# Patient Record
Sex: Female | Born: 1940
Health system: Southern US, Community
[De-identification: ages and names within clinical notes are randomized; demographics above are authoritative.]

## PROBLEM LIST (undated history)

## (undated) DIAGNOSIS — I8390 Asymptomatic varicose veins of unspecified lower extremity: Secondary | ICD-10-CM

## (undated) DIAGNOSIS — L309 Dermatitis, unspecified: Secondary | ICD-10-CM

## (undated) DIAGNOSIS — G43909 Migraine, unspecified, not intractable, without status migrainosus: Secondary | ICD-10-CM

## (undated) DIAGNOSIS — C44711 Basal cell carcinoma of skin of unspecified lower limb, including hip: Secondary | ICD-10-CM

## (undated) DIAGNOSIS — M5134 Other intervertebral disc degeneration, thoracic region: Secondary | ICD-10-CM

## (undated) DIAGNOSIS — R600 Localized edema: Secondary | ICD-10-CM

## (undated) DIAGNOSIS — Z8489 Family history of other specified conditions: Secondary | ICD-10-CM

## (undated) DIAGNOSIS — M5136 Other intervertebral disc degeneration, lumbar region: Secondary | ICD-10-CM

## (undated) DIAGNOSIS — M1612 Unilateral primary osteoarthritis, left hip: Secondary | ICD-10-CM

## (undated) DIAGNOSIS — M51369 Other intervertebral disc degeneration, lumbar region without mention of lumbar back pain or lower extremity pain: Secondary | ICD-10-CM

## (undated) DIAGNOSIS — R2681 Unsteadiness on feet: Secondary | ICD-10-CM

## (undated) DIAGNOSIS — I480 Paroxysmal atrial fibrillation: Secondary | ICD-10-CM

## (undated) DIAGNOSIS — J189 Pneumonia, unspecified organism: Secondary | ICD-10-CM

## (undated) DIAGNOSIS — M1990 Unspecified osteoarthritis, unspecified site: Secondary | ICD-10-CM

## (undated) HISTORY — DX: Paroxysmal atrial fibrillation: I48.0

## (undated) HISTORY — DX: Unsteadiness on feet: R26.81

## (undated) HISTORY — DX: Unilateral primary osteoarthritis, left hip: M16.12

## (undated) HISTORY — PX: VARICOSE VEIN SURGERY: SHX832

## (undated) HISTORY — PX: JOINT REPLACEMENT: SHX530

## (undated) HISTORY — PX: TUBAL LIGATION: SHX77

## (undated) HISTORY — PX: BASAL CELL CARCINOMA EXCISION: SHX1214

## (undated) HISTORY — PX: DILATION AND CURETTAGE OF UTERUS: SHX78

---

## 1998-08-31 ENCOUNTER — Other Ambulatory Visit: Admission: RE | Admit: 1998-08-31 | Discharge: 1998-08-31 | Payer: Self-pay | Admitting: Gynecology

## 1998-09-14 ENCOUNTER — Ambulatory Visit (HOSPITAL_COMMUNITY): Admission: RE | Admit: 1998-09-14 | Discharge: 1998-09-14 | Payer: Self-pay | Admitting: Sports Medicine

## 1998-09-14 ENCOUNTER — Encounter: Payer: Self-pay | Admitting: Sports Medicine

## 1999-03-09 ENCOUNTER — Encounter: Admission: RE | Admit: 1999-03-09 | Discharge: 1999-03-09 | Payer: Self-pay | Admitting: Gynecology

## 1999-09-01 ENCOUNTER — Encounter: Payer: Self-pay | Admitting: Gynecology

## 1999-09-01 ENCOUNTER — Encounter: Admission: RE | Admit: 1999-09-01 | Discharge: 1999-09-01 | Payer: Self-pay | Admitting: Gynecology

## 1999-09-15 ENCOUNTER — Other Ambulatory Visit: Admission: RE | Admit: 1999-09-15 | Discharge: 1999-09-15 | Payer: Self-pay | Admitting: Gynecology

## 2000-09-02 ENCOUNTER — Encounter: Payer: Self-pay | Admitting: Gynecology

## 2000-09-02 ENCOUNTER — Encounter: Admission: RE | Admit: 2000-09-02 | Discharge: 2000-09-02 | Payer: Self-pay | Admitting: Gynecology

## 2000-09-20 ENCOUNTER — Other Ambulatory Visit: Admission: RE | Admit: 2000-09-20 | Discharge: 2000-09-20 | Payer: Self-pay | Admitting: Gynecology

## 2001-09-05 ENCOUNTER — Encounter: Payer: Self-pay | Admitting: Gynecology

## 2001-09-05 ENCOUNTER — Encounter: Admission: RE | Admit: 2001-09-05 | Discharge: 2001-09-05 | Payer: Self-pay | Admitting: Gynecology

## 2001-09-17 ENCOUNTER — Other Ambulatory Visit: Admission: RE | Admit: 2001-09-17 | Discharge: 2001-09-17 | Payer: Self-pay | Admitting: Gynecology

## 2002-08-07 ENCOUNTER — Ambulatory Visit (HOSPITAL_COMMUNITY): Admission: RE | Admit: 2002-08-07 | Discharge: 2002-08-07 | Payer: Self-pay | Admitting: Gastroenterology

## 2002-09-25 ENCOUNTER — Encounter: Admission: RE | Admit: 2002-09-25 | Discharge: 2002-09-25 | Payer: Self-pay | Admitting: Gynecology

## 2002-09-25 ENCOUNTER — Encounter: Payer: Self-pay | Admitting: Gynecology

## 2002-10-02 ENCOUNTER — Other Ambulatory Visit: Admission: RE | Admit: 2002-10-02 | Discharge: 2002-10-02 | Payer: Self-pay | Admitting: Gynecology

## 2003-10-08 ENCOUNTER — Ambulatory Visit (HOSPITAL_COMMUNITY): Admission: RE | Admit: 2003-10-08 | Discharge: 2003-10-08 | Payer: Self-pay | Admitting: Gynecology

## 2003-10-19 ENCOUNTER — Encounter: Admission: RE | Admit: 2003-10-19 | Discharge: 2003-10-19 | Payer: Self-pay | Admitting: Gynecology

## 2003-10-28 ENCOUNTER — Other Ambulatory Visit: Admission: RE | Admit: 2003-10-28 | Discharge: 2003-10-28 | Payer: Self-pay | Admitting: Gynecology

## 2004-10-20 ENCOUNTER — Ambulatory Visit (HOSPITAL_COMMUNITY): Admission: RE | Admit: 2004-10-20 | Discharge: 2004-10-20 | Payer: Self-pay | Admitting: Gynecology

## 2004-11-10 ENCOUNTER — Other Ambulatory Visit: Admission: RE | Admit: 2004-11-10 | Discharge: 2004-11-10 | Payer: Self-pay | Admitting: Gynecology

## 2005-09-07 ENCOUNTER — Other Ambulatory Visit: Admission: RE | Admit: 2005-09-07 | Discharge: 2005-09-07 | Payer: Self-pay | Admitting: Gynecology

## 2005-09-17 ENCOUNTER — Ambulatory Visit (HOSPITAL_COMMUNITY): Admission: RE | Admit: 2005-09-17 | Discharge: 2005-09-17 | Payer: Self-pay | Admitting: Gynecology

## 2006-09-19 ENCOUNTER — Ambulatory Visit (HOSPITAL_COMMUNITY): Admission: RE | Admit: 2006-09-19 | Discharge: 2006-09-19 | Payer: Self-pay | Admitting: Gynecology

## 2006-09-24 ENCOUNTER — Other Ambulatory Visit: Admission: RE | Admit: 2006-09-24 | Discharge: 2006-09-24 | Payer: Self-pay | Admitting: Gynecology

## 2007-09-22 ENCOUNTER — Ambulatory Visit (HOSPITAL_COMMUNITY): Admission: RE | Admit: 2007-09-22 | Discharge: 2007-09-22 | Payer: Self-pay | Admitting: Gynecology

## 2008-01-12 DIAGNOSIS — C4491 Basal cell carcinoma of skin, unspecified: Secondary | ICD-10-CM

## 2008-01-12 HISTORY — DX: Basal cell carcinoma of skin, unspecified: C44.91

## 2008-09-23 ENCOUNTER — Ambulatory Visit (HOSPITAL_COMMUNITY): Admission: RE | Admit: 2008-09-23 | Discharge: 2008-09-23 | Payer: Self-pay | Admitting: Gynecology

## 2009-09-28 ENCOUNTER — Ambulatory Visit (HOSPITAL_COMMUNITY): Admission: RE | Admit: 2009-09-28 | Discharge: 2009-09-28 | Payer: Self-pay | Admitting: Gynecology

## 2010-09-05 ENCOUNTER — Other Ambulatory Visit (HOSPITAL_COMMUNITY): Payer: Self-pay | Admitting: Gynecology

## 2010-09-05 DIAGNOSIS — Z1231 Encounter for screening mammogram for malignant neoplasm of breast: Secondary | ICD-10-CM

## 2010-10-02 ENCOUNTER — Ambulatory Visit (HOSPITAL_COMMUNITY)
Admission: RE | Admit: 2010-10-02 | Discharge: 2010-10-02 | Disposition: A | Discharge status: Home / Self Care | Payer: Medicare Other | Source: Ambulatory Visit | Attending: Gynecology | Admitting: Gynecology

## 2010-10-02 DIAGNOSIS — Z1231 Encounter for screening mammogram for malignant neoplasm of breast: Secondary | ICD-10-CM | POA: Insufficient documentation

## 2011-01-30 DIAGNOSIS — I480 Paroxysmal atrial fibrillation: Secondary | ICD-10-CM

## 2011-01-30 HISTORY — DX: Paroxysmal atrial fibrillation: I48.0

## 2011-09-11 ENCOUNTER — Other Ambulatory Visit (HOSPITAL_COMMUNITY): Payer: Self-pay | Admitting: Internal Medicine

## 2011-09-11 DIAGNOSIS — Z1231 Encounter for screening mammogram for malignant neoplasm of breast: Secondary | ICD-10-CM

## 2011-10-09 ENCOUNTER — Ambulatory Visit (HOSPITAL_COMMUNITY): Payer: Medicare Other

## 2011-11-27 ENCOUNTER — Ambulatory Visit (HOSPITAL_COMMUNITY)
Admission: RE | Admit: 2011-11-27 | Discharge: 2011-11-27 | Disposition: A | Discharge status: Home / Self Care | Payer: Medicare Other | Source: Ambulatory Visit | Attending: Internal Medicine | Admitting: Internal Medicine

## 2011-11-27 DIAGNOSIS — Z1231 Encounter for screening mammogram for malignant neoplasm of breast: Secondary | ICD-10-CM

## 2012-09-22 ENCOUNTER — Other Ambulatory Visit: Payer: Self-pay | Admitting: Gastroenterology

## 2012-09-30 ENCOUNTER — Encounter (HOSPITAL_COMMUNITY): Payer: Self-pay | Admitting: *Deleted

## 2012-10-09 ENCOUNTER — Other Ambulatory Visit (HOSPITAL_COMMUNITY): Payer: Self-pay | Admitting: Obstetrics and Gynecology

## 2012-10-09 DIAGNOSIS — Z1231 Encounter for screening mammogram for malignant neoplasm of breast: Secondary | ICD-10-CM

## 2012-10-24 ENCOUNTER — Encounter (HOSPITAL_COMMUNITY): Payer: Self-pay | Admitting: Pharmacy Technician

## 2012-10-28 ENCOUNTER — Encounter (HOSPITAL_COMMUNITY): Payer: Self-pay | Admitting: Anesthesiology

## 2012-10-28 ENCOUNTER — Encounter (HOSPITAL_COMMUNITY)
Admission: RE | Disposition: A | Discharge status: Home / Self Care | Payer: Self-pay | Source: Ambulatory Visit | Attending: Gastroenterology

## 2012-10-28 ENCOUNTER — Encounter (HOSPITAL_COMMUNITY): Payer: Self-pay | Admitting: *Deleted

## 2012-10-28 ENCOUNTER — Ambulatory Visit (HOSPITAL_COMMUNITY)
Admission: RE | Admit: 2012-10-28 | Discharge: 2012-10-28 | Disposition: A | Discharge status: Home / Self Care | Payer: Medicare Other | Source: Ambulatory Visit | Attending: Gastroenterology | Admitting: Gastroenterology

## 2012-10-28 ENCOUNTER — Ambulatory Visit (HOSPITAL_COMMUNITY): Payer: Medicare Other | Admitting: Anesthesiology

## 2012-10-28 DIAGNOSIS — K5730 Diverticulosis of large intestine without perforation or abscess without bleeding: Secondary | ICD-10-CM | POA: Insufficient documentation

## 2012-10-28 DIAGNOSIS — E7800 Pure hypercholesterolemia, unspecified: Secondary | ICD-10-CM | POA: Insufficient documentation

## 2012-10-28 DIAGNOSIS — Z1211 Encounter for screening for malignant neoplasm of colon: Secondary | ICD-10-CM | POA: Insufficient documentation

## 2012-10-28 DIAGNOSIS — Z7901 Long term (current) use of anticoagulants: Secondary | ICD-10-CM | POA: Insufficient documentation

## 2012-10-28 DIAGNOSIS — D126 Benign neoplasm of colon, unspecified: Secondary | ICD-10-CM | POA: Insufficient documentation

## 2012-10-28 DIAGNOSIS — I4891 Unspecified atrial fibrillation: Secondary | ICD-10-CM | POA: Insufficient documentation

## 2012-10-28 HISTORY — DX: Unspecified osteoarthritis, unspecified site: M19.90

## 2012-10-28 HISTORY — DX: Localized edema: R60.0

## 2012-10-28 HISTORY — PX: COLONOSCOPY WITH PROPOFOL: SHX5780

## 2012-10-28 HISTORY — DX: Asymptomatic varicose veins of unspecified lower extremity: I83.90

## 2012-10-28 SURGERY — COLONOSCOPY WITH PROPOFOL
Anesthesia: Monitor Anesthesia Care

## 2012-10-28 MED ORDER — KETAMINE HCL 50 MG/ML IJ SOLN
INTRAMUSCULAR | Status: DC | PRN
  Administered 2012-10-28: 10 mg via INTRAMUSCULAR

## 2012-10-28 MED ORDER — PROPOFOL 10 MG/ML IV BOLUS
INTRAVENOUS | Status: DC | PRN
  Administered 2012-10-28 (×4): 20 mg via INTRAVENOUS

## 2012-10-28 MED ORDER — PROPOFOL INFUSION 10 MG/ML OPTIME
INTRAVENOUS | Status: DC | PRN
  Administered 2012-10-28: 75 ug/kg/min via INTRAVENOUS

## 2012-10-28 MED ORDER — LACTATED RINGERS IV SOLN
INTRAVENOUS | Status: DC | PRN
  Administered 2012-10-28: 10:00:00 via INTRAVENOUS

## 2012-10-28 SURGICAL SUPPLY — 22 items

## 2012-10-28 NOTE — H&P (Signed)
  Procedure: Screening colonoscopy  History: The patient is a 72 year old female born [birth year]. The patient has paroxysmal atrial fibrillation and chronically takes xarelto.   The patient underwent a normal screening colonoscopy in March 2004. She is scheduled to undergo a repeat screening colonoscopy today.  Chronic medications: Diltiazem. Xarelto. Premarin.  Past medical and surgical history: Paroxysmal atrial fibrillation. Rosacea. Fibrocystic breast disease. Degenerative disc disease of the lumbosacral spine. Colonic diverticulosis. Ocular migraine syndrome. Hypercholesterolemia. Basal cell skin cancers. Chronic urticaria. Left varicose vein stripping. Tubal ligation.  Allergies: Iodine. Adhesive bandages. Metoprolol.  Exam: The patient is alert and lying comfortably on the endoscopy stretcher. Abdomen is soft, flat and nontender  to palpation. Cardiac exam reveals a regular rhythm. Lungs are clear to auscultation  Plan: Proceed with repeat screening colonoscopy as scheduled.

## 2012-10-28 NOTE — Op Note (Signed)
Procedure: Screening colonoscopy. Patient underwent a normal screening colonoscopy in March 2004  Endoscopist: Danise Edge  Premedication: Fall administered by anesthesia  Procedure: The patient was placed in the left lateral decubitus position. Anal inspection and digital rectal exam were normal. The Pentax pediatric colonoscope was introduced into the rectum and advanced to the cecum. A normal-appearing ileocecal valve and appendiceal orifice were identified. Colonic preparation for the exam today was good.  Rectum. Normal. Retroflex view of the distal rectum normal.  Sigmoid colon and descending colon. Left colonic diverticulosis. From the proximal descending colon, a 3 mm sessile polyp was removed with the cold biopsy forceps. Splenic flexure. Normal.  Transverse colon. Normal.  Hepatic flexure. Normal.  Ascending colon. From the mid ascending colon, a 3 mm sessile polyp was removed with the cold biopsy forceps.  Cecum and ileocecal valve. Normal.  Assessment:  #1. Left colonic diverticulosis  #2. A small polyp was removed from the mid ascending colon and a small polyp was removed from the proximal descending colon. The polyp were submitted for pathological evaluation.  Recommendations: If colon polyp returns neoplastic pathologically, the patient should undergo a surveillance colonoscopy in 5 years.

## 2012-10-28 NOTE — Anesthesia Preprocedure Evaluation (Addendum)
Anesthesia Evaluation  Patient identified by MRN, date of birth, ID band Patient awake    Reviewed: Allergy & Precautions, H&P , NPO status , Patient's Chart, lab work & pertinent test results  Airway Mallampati: II TM Distance: >3 FB Neck ROM: Full    Dental  (+) Teeth Intact and Dental Advisory Given   Pulmonary former smoker,  breath sounds clear to auscultation  Pulmonary exam normal       Cardiovascular + dysrhythmias Atrial Fibrillation Rate:Normal     Neuro/Psych  Headaches, negative psych ROS   GI/Hepatic negative GI ROS, Neg liver ROS,   Endo/Other  negative endocrine ROS  Renal/GU negative Renal ROS  negative genitourinary   Musculoskeletal negative musculoskeletal ROS (+)   Abdominal   Peds  Hematology negative hematology ROS (+)   Anesthesia Other Findings   Reproductive/Obstetrics                          Anesthesia Physical Anesthesia Plan  ASA: II  Anesthesia Plan: MAC   Post-op Pain Management:    Induction: Intravenous  Airway Management Planned: Simple Face Mask  Additional Equipment:   Intra-op Plan:   Post-operative Plan:   Informed Consent: I have reviewed the patients History and Physical, chart, labs and discussed the procedure including the risks, benefits and alternatives for the proposed anesthesia with the patient or authorized representative who has indicated his/her understanding and acceptance.   Dental advisory given  Plan Discussed with: CRNA  Anesthesia Plan Comments:         Anesthesia Quick Evaluation

## 2012-10-28 NOTE — Transfer of Care (Signed)
Immediate Anesthesia Transfer of Care Note  Patient: Tonya Turner  Procedure(s) Performed: Procedure(s) (LRB): COLONOSCOPY WITH PROPOFOL (N/A)  Patient Location: PACU  Anesthesia Type: MAC  Level of Consciousness: sedated, patient cooperative and responds to stimulaton  Airway & Oxygen Therapy: Patient Spontanous Breathing and Patient connected to face mask oxgen  Post-op Assessment: Report given to PACU RN and Post -op Vital signs reviewed and stable  Post vital signs: Reviewed and stable  Complications: No apparent anesthesia complications

## 2012-10-28 NOTE — Anesthesia Postprocedure Evaluation (Signed)
Anesthesia Post Note  Patient: Tonya Turner  Procedure(s) Performed: Procedure(s) (LRB): COLONOSCOPY WITH PROPOFOL (N/A)  Anesthesia type: MAC  Patient location: PACU  Post pain: Pain level controlled  Post assessment: Post-op Vital signs reviewed  Last Vitals:  Filed Vitals:   10/28/12 1124  BP: 122/74  Pulse:   Temp:   Resp: 12    Post vital signs: Reviewed  Level of consciousness: sedated  Complications: No apparent anesthesia complications

## 2012-10-29 ENCOUNTER — Encounter (HOSPITAL_COMMUNITY): Payer: Self-pay | Admitting: Gastroenterology

## 2012-12-16 ENCOUNTER — Ambulatory Visit (HOSPITAL_COMMUNITY)
Admission: RE | Admit: 2012-12-16 | Discharge: 2012-12-16 | Disposition: A | Discharge status: Home / Self Care | Payer: Medicare Other | Source: Ambulatory Visit | Attending: Obstetrics and Gynecology | Admitting: Obstetrics and Gynecology

## 2012-12-16 DIAGNOSIS — Z1231 Encounter for screening mammogram for malignant neoplasm of breast: Secondary | ICD-10-CM

## 2013-01-15 ENCOUNTER — Other Ambulatory Visit: Payer: Self-pay | Admitting: Obstetrics and Gynecology

## 2013-01-15 ENCOUNTER — Other Ambulatory Visit (HOSPITAL_COMMUNITY)
Admission: RE | Admit: 2013-01-15 | Discharge: 2013-01-15 | Disposition: A | Discharge status: Home / Self Care | Payer: Medicare Other | Source: Ambulatory Visit | Attending: Obstetrics and Gynecology | Admitting: Obstetrics and Gynecology

## 2013-01-15 DIAGNOSIS — Z124 Encounter for screening for malignant neoplasm of cervix: Secondary | ICD-10-CM | POA: Insufficient documentation

## 2013-01-15 DIAGNOSIS — Z1151 Encounter for screening for human papillomavirus (HPV): Secondary | ICD-10-CM | POA: Insufficient documentation

## 2013-02-10 ENCOUNTER — Other Ambulatory Visit: Payer: Self-pay | Admitting: Cardiology

## 2013-02-10 DIAGNOSIS — I4891 Unspecified atrial fibrillation: Secondary | ICD-10-CM

## 2013-02-10 DIAGNOSIS — Z79899 Other long term (current) drug therapy: Secondary | ICD-10-CM

## 2013-05-20 ENCOUNTER — Encounter: Payer: Self-pay | Admitting: Podiatry

## 2013-05-20 ENCOUNTER — Ambulatory Visit (INDEPENDENT_AMBULATORY_CARE_PROVIDER_SITE_OTHER): Payer: Medicare Other | Admitting: Podiatry

## 2013-05-20 VITALS — BP 106/76 | HR 80 | Resp 16 | Ht 65.0 in | Wt 151.0 lb

## 2013-05-20 DIAGNOSIS — M2040 Other hammer toe(s) (acquired), unspecified foot: Secondary | ICD-10-CM

## 2013-05-20 DIAGNOSIS — L84 Corns and callosities: Secondary | ICD-10-CM

## 2013-05-20 DIAGNOSIS — M779 Enthesopathy, unspecified: Secondary | ICD-10-CM

## 2013-05-20 MED ORDER — TRIAMCINOLONE ACETONIDE 10 MG/ML IJ SUSP
10.0000 mg | Freq: Once | INTRAMUSCULAR | Status: AC
  Administered 2013-05-20: 10 mg

## 2013-05-20 NOTE — Progress Notes (Signed)
Subjective:     Patient ID: Tonya Turner, female   DOB: 1940-07-14, 72 y.o.   MRN: [id number]  HPI patient presents stating I'm having a lot of pain between the big toe and second toe on my right foot with inflammation and corn formation. Patient also points to the outside of the left fifth metatarsal stating that has corn and has been sore   Review of Systems     Objective:   Physical Exam Neurovascular status is intact and there's been no health history changes noted   patient is noted to have structural hammertoe deformity second toe right with keratotic lesion formation and pain with fluid buildup around the interphalangeal joint. Also noted to have a lesion underneath the fifth metatarsal right Assessment:     Hammertoe deformity second right #1 and capsulitis second interphalangeal joint right along with keratotic lesion formation    Plan:     Reviewed hammertoe deformity and consideration for surgery. Today I injected the interphalangeal joint to milligrams dexamethasone Kenalog 2 mg Xylocaine and debrided lesions. Reappoint when symptomatic

## 2013-06-03 ENCOUNTER — Telehealth: Payer: Self-pay | Admitting: Cardiology

## 2013-06-03 NOTE — Telephone Encounter (Signed)
New problem  Pt need a new prescription faxed for 90 day for Xaralto sent to [facility]. An order will be sent to [place]. Just an [place]

## 2013-06-09 ENCOUNTER — Telehealth: Payer: Self-pay

## 2013-06-09 MED ORDER — RIVAROXABAN 20 MG PO TABS: 20.0000 mg | ORAL_TABLET | Freq: Every day | ORAL | Status: DC

## 2013-06-09 NOTE — Telephone Encounter (Signed)
Rx refilled.

## 2013-07-08 ENCOUNTER — Ambulatory Visit (INDEPENDENT_AMBULATORY_CARE_PROVIDER_SITE_OTHER): Payer: Medicare HMO | Admitting: Podiatry

## 2013-07-08 ENCOUNTER — Encounter: Payer: Self-pay | Admitting: Podiatry

## 2013-07-08 VITALS — BP 103/69 | HR 74 | Resp 16

## 2013-07-08 DIAGNOSIS — M779 Enthesopathy, unspecified: Secondary | ICD-10-CM

## 2013-07-08 DIAGNOSIS — L84 Corns and callosities: Secondary | ICD-10-CM

## 2013-07-08 DIAGNOSIS — M2040 Other hammer toe(s) (acquired), unspecified foot: Secondary | ICD-10-CM

## 2013-07-08 MED ORDER — TRIAMCINOLONE ACETONIDE 10 MG/ML IJ SUSP
10.0000 mg | Freq: Once | INTRAMUSCULAR | Status: AC
  Administered 2013-07-08: 10 mg

## 2013-07-09 NOTE — Progress Notes (Signed)
Subjective:     Patient ID: Tonya Turner, female   DOB: February 17, 1941, 73 y.o.   MRN: [id number]  HPI patient presents stating this corn is back on my second toe right foot. It was good for about 5 weeks and now is sore again and makes my daily walks difficult   Review of Systems     Objective:   Physical Exam Neurovascular status intact with inflamed interphalangeal joint right second toe medial side with keratotic lesion formation. Also noted to have structural deformity with hallux pressing against the second toe    Assessment:     Bone structural deformity in combination with interphalangeal joint capsulitis second toe right with keratotic lesion    Plan:     We are going to do one more small interphalangeal joint injection which was accomplished today with 1.5 mg dexamethasone Kenalog combination 2 mg Xylocaine and deep debridement of lesion accomplished. I also dispensed splint to lower the second toe and discussed surgery if this does not get better

## 2013-08-06 ENCOUNTER — Other Ambulatory Visit: Payer: Medicare Other

## 2013-08-08 ENCOUNTER — Encounter: Payer: Self-pay | Admitting: Cardiology

## 2013-08-08 DIAGNOSIS — R600 Localized edema: Secondary | ICD-10-CM | POA: Insufficient documentation

## 2013-08-08 DIAGNOSIS — I499 Cardiac arrhythmia, unspecified: Secondary | ICD-10-CM | POA: Insufficient documentation

## 2013-08-08 DIAGNOSIS — M1990 Unspecified osteoarthritis, unspecified site: Secondary | ICD-10-CM | POA: Insufficient documentation

## 2013-08-08 DIAGNOSIS — C801 Malignant (primary) neoplasm, unspecified: Secondary | ICD-10-CM | POA: Insufficient documentation

## 2013-08-08 DIAGNOSIS — I8390 Asymptomatic varicose veins of unspecified lower extremity: Secondary | ICD-10-CM | POA: Insufficient documentation

## 2013-08-11 ENCOUNTER — Other Ambulatory Visit (INDEPENDENT_AMBULATORY_CARE_PROVIDER_SITE_OTHER): Payer: Commercial Managed Care - HMO

## 2013-08-11 DIAGNOSIS — I4891 Unspecified atrial fibrillation: Secondary | ICD-10-CM

## 2013-08-11 DIAGNOSIS — Z79899 Other long term (current) drug therapy: Secondary | ICD-10-CM

## 2013-08-11 LAB — CBC
HCT: 39.7 % (ref 36.0–46.0)
Hemoglobin: 13.3 g/dL (ref 12.0–15.0)
MCHC: 33.6 g/dL (ref 30.0–36.0)
MCV: 95.5 fl (ref 78.0–100.0)
Platelets: 339 10*3/uL (ref 150.0–400.0)
RBC: 4.16 Mil/uL (ref 3.87–5.11)
RDW: 13.8 % (ref 11.5–14.6)
WBC: 6.4 10*3/uL (ref 4.5–10.5)

## 2013-08-11 LAB — CREATININE, SERUM: CREATININE: 0.7 mg/dL (ref 0.4–1.2)

## 2013-08-14 ENCOUNTER — Ambulatory Visit: Payer: Medicare Other | Admitting: Cardiology

## 2013-08-19 ENCOUNTER — Ambulatory Visit (INDEPENDENT_AMBULATORY_CARE_PROVIDER_SITE_OTHER): Payer: Commercial Managed Care - HMO | Admitting: Cardiology

## 2013-08-19 ENCOUNTER — Encounter: Payer: Self-pay | Admitting: Cardiology

## 2013-08-19 VITALS — BP 103/70 | HR 75 | Ht 65.0 in | Wt 155.0 lb

## 2013-08-19 DIAGNOSIS — I480 Paroxysmal atrial fibrillation: Secondary | ICD-10-CM

## 2013-08-19 DIAGNOSIS — I4891 Unspecified atrial fibrillation: Secondary | ICD-10-CM

## 2013-08-19 DIAGNOSIS — Z79899 Other long term (current) drug therapy: Secondary | ICD-10-CM

## 2013-08-19 DIAGNOSIS — Z7901 Long term (current) use of anticoagulants: Secondary | ICD-10-CM

## 2013-08-19 NOTE — Patient Instructions (Signed)
Your physician recommends that you continue on your current medications as directed. Please refer to the Current Medication list given to you today.  Your physician wants you to follow-up in: 6 months with Dr. Skains. You will receive a reminder letter in the mail two months in advance. If you don't receive a letter, please call our office to schedule the follow-up appointment.  

## 2013-08-19 NOTE — Progress Notes (Signed)
[place]. [address], [address] [place], [place]  [address] Phone: [phone] Fax:  [phone]  Date:  08/19/2013   ID:  Tonya Turner, DOB 26-May-1941, MRN [id number]  PCP:  Irven Shelling, MD   History of Present Illness: Tonya Turner is a 73 y.o. female for paroxysmal atrial fibrillation followup. Very rare episode of AFIB. Once with a walk with a friend.  She is being protected with anticoagulation. She was very diligent about trying to find out the difference in price between oral anticoagulants.  Overall, she feels well. She's not had any stroke, bleeding, chest pain symptoms.  We went over her CHADS-VAS score and it is 2 -female, age greater than 36. Because of this, she deserves anticoagulation.   1. Normal LV size and function.2. There were no regional wall motion abnormalities.3. Left ventricular ejection fraction estimated by 2D at 60-65 percent. 4. Trace mitral valve regurgitation.5. Trivial tricuspid regurgitation    Wt Readings from Last 3 Encounters:  08/19/13 155 lb (70.308 kg)  05/20/13 151 lb (68.493 kg)  10/28/12 155 lb (70.308 kg)     Past Medical History  Diagnosis Date  . Dysrhythmia     hx. A.Fib.-tx. Xarelto  . Varicose vein     hx. of-   . Edema of extremities     usually lower extremities- bilateral  . Headache(784.0)     rare occular migraine- none recent  . Cancer     basal cell skin cancer lesions excised  . Arthritis     hands-mild    Past Surgical History  Procedure Laterality Date  . Varicose vein surgery      left  . Tubal ligation    . Colonoscopy with propofol N/A 10/28/2012    Procedure: COLONOSCOPY WITH PROPOFOL;  Surgeon: Garlan Fair, MD;  Location: [facility];  Service: Endoscopy;  Laterality: N/A;    Current Outpatient Prescriptions  Medication Sig Dispense Refill  . calcium-vitamin D (OSCAL WITH D) 500-200 MG-UNIT per tablet Take 1 tablet by mouth daily.      Marland Kitchen conjugated estrogens (PREMARIN) vaginal  cream Place 1 Applicatorful vaginally 2 (two) times a week.       . diltiazem (DILACOR XR) 180 MG 24 hr capsule Take 180 mg by mouth every morning.      . Diltiazem HCl Coated Beads (CARTIA XT PO) Take by mouth daily.      Marland Kitchen glucosamine-chondroitin 500-400 MG tablet Take 0.5 tablets by mouth 1 day or 1 dose.       . Multiple Vitamin (MULTIVITAMIN WITH MINERALS) TABS Take 0.5 tablets by mouth daily.      . Rivaroxaban (XARELTO) 20 MG TABS tablet Take 1 tablet (20 mg total) by mouth daily with supper.  90 tablet  2   No current facility-administered medications for this visit.    Allergies:    Allergies  Allergen Reactions  . Other Rash    Adhesives-skin irritation  . Iodine Solution [Povidone Iodine] Dermatitis    Topical -causes skin inflammation    Social History:  The patient  reports that she quit smoking about 29 years ago. Her smoking use included Cigarettes. She has a 7.5 pack-year smoking history. She has never used smokeless tobacco. She reports that she drinks alcohol. She reports that she does not use illicit drugs.   ROS:  Please see the history of present illness.   Denies any bleeding, syncope, orthopnea, PND    PHYSICAL EXAM: VS:  BP 103/70  Pulse 75  Ht 5\' 5"  (1.651 m)  Wt 155 lb (70.308 kg)  BMI 25.79 kg/m2 Well nourished, well developed, in no acute distress HEENT: normal Neck: no JVD Cardiac:  normal S1, S2; RRR; no murmur Lungs:  clear to auscultation bilaterally, no wheezing, rhonchi or rales Abd: soft, nontender, no hepatomegaly Ext: no edema Skin: warm and dry Neuro: no focal abnormalities noted  EKG:  None today   ASSESSMENT AND PLAN:  1. Atrial fibrillation - paroxysmal. Doing very well. No changes. Very brief episodes. Continue with current medication. 2. Chronic anticoagulation-no changes made to medications. Check lab work every 6 months. Creatinine 0.7, hemoglobin 13.3. Labs reviewed. 3. Medication management-went over an extensive list of  medications that can or cannot be used with her current medications. She appreciated this counseling.  Signed, Candee Furbish, MD [facility]  08/19/2013 2:16 PM

## 2013-09-01 ENCOUNTER — Other Ambulatory Visit: Payer: Self-pay | Admitting: *Deleted

## 2013-09-01 MED ORDER — DILTIAZEM HCL ER 180 MG PO CP24: 180.0000 mg | ORAL_CAPSULE | Freq: Every morning | ORAL | Status: DC

## 2013-11-26 ENCOUNTER — Other Ambulatory Visit (HOSPITAL_COMMUNITY): Payer: Self-pay | Admitting: Obstetrics and Gynecology

## 2013-11-26 DIAGNOSIS — Z1231 Encounter for screening mammogram for malignant neoplasm of breast: Secondary | ICD-10-CM

## 2013-12-17 ENCOUNTER — Ambulatory Visit (HOSPITAL_COMMUNITY)
Admission: RE | Admit: 2013-12-17 | Discharge: 2013-12-17 | Disposition: A | Discharge status: Home / Self Care | Payer: Medicare HMO | Source: Ambulatory Visit | Attending: Obstetrics and Gynecology | Admitting: Obstetrics and Gynecology

## 2013-12-17 DIAGNOSIS — Z1231 Encounter for screening mammogram for malignant neoplasm of breast: Secondary | ICD-10-CM | POA: Diagnosis not present

## 2013-12-29 ENCOUNTER — Telehealth: Payer: Self-pay | Admitting: Cardiology

## 2013-12-29 NOTE — Telephone Encounter (Signed)
New problem    Pt need new prescription for Xarelto she need 30 day instead 90 day because it would cost less. Please call pt.

## 2013-12-29 NOTE — Telephone Encounter (Signed)
NA - need to know which pharmacy she would like the RX to go to.

## 2013-12-30 MED ORDER — RIVAROXABAN 20 MG PO TABS: 20.0000 mg | ORAL_TABLET | Freq: Every day | ORAL | Status: DC

## 2013-12-30 MED ORDER — DILTIAZEM HCL ER 180 MG PO CP24: 180.0000 mg | ORAL_CAPSULE | Freq: Every morning | ORAL | Status: DC

## 2013-12-30 NOTE — Telephone Encounter (Signed)
Pt calling because she needs a 30 day supply send into her mail order pharmacy.  She can not afford a 90 day supply at this time to do being in the "doughnut hole"  She also requests a written RX for Dilt and Xarelto be mailed to her home that she can take to a local pharmacy to check the price.  She will be going to [place] for 4 weeks and wants to make sure she has enough with her and doesn't get stuck without it.  Rx printed and mailed to home address.

## 2014-01-05 ENCOUNTER — Other Ambulatory Visit: Payer: Self-pay

## 2014-01-05 MED ORDER — RIVAROXABAN 20 MG PO TABS: 20.0000 mg | ORAL_TABLET | Freq: Every day | ORAL | Status: DC

## 2014-01-05 NOTE — Telephone Encounter (Signed)
Patient called to get a rx for xarelto sent to [facility] and i gave her samples and a frre 30 day card placed at front and sent in rx

## 2014-01-11 ENCOUNTER — Other Ambulatory Visit: Payer: Self-pay

## 2014-01-11 MED ORDER — RIVAROXABAN 20 MG PO TABS: 20.0000 mg | ORAL_TABLET | Freq: Every day | ORAL | Status: DC

## 2014-01-11 MED ORDER — DILTIAZEM HCL ER 180 MG PO CP24: 180.0000 mg | ORAL_CAPSULE | Freq: Every morning | ORAL | Status: DC

## 2014-02-17 ENCOUNTER — Telehealth: Payer: Self-pay | Admitting: Cardiology

## 2014-02-17 DIAGNOSIS — Z79899 Other long term (current) drug therapy: Secondary | ICD-10-CM

## 2014-02-17 NOTE — Telephone Encounter (Signed)
Phone ringed several times no answer service.

## 2014-02-17 NOTE — Telephone Encounter (Signed)
Patient requests labs be drawn before her appointment with Dr. Marlou Porch on 02/24/2014. Scheduled these.

## 2014-02-17 NOTE — Telephone Encounter (Signed)
Spoke with patient who would like to have her labs drawn 02/18/2014. Her appointment with Dr. Marlou Porch is 02/24/2014.

## 2014-02-17 NOTE — Telephone Encounter (Signed)
New message    Patient calling stating from the last office visit it did not states for her to have lab work . However, she is asking for labs to be drawn.

## 2014-02-18 ENCOUNTER — Other Ambulatory Visit (INDEPENDENT_AMBULATORY_CARE_PROVIDER_SITE_OTHER): Payer: Commercial Managed Care - HMO

## 2014-02-18 DIAGNOSIS — Z79899 Other long term (current) drug therapy: Secondary | ICD-10-CM

## 2014-02-18 LAB — CBC WITH DIFFERENTIAL/PLATELET
Basophils Absolute: 0.1 10*3/uL (ref 0.0–0.1)
Basophils Relative: 1 % (ref 0.0–3.0)
EOS PCT: 1.5 % (ref 0.0–5.0)
Eosinophils Absolute: 0.1 10*3/uL (ref 0.0–0.7)
HCT: 40.8 % (ref 36.0–46.0)
Hemoglobin: 13.5 g/dL (ref 12.0–15.0)
Lymphocytes Relative: 28.8 % (ref 12.0–46.0)
Lymphs Abs: 2.2 10*3/uL (ref 0.7–4.0)
MCHC: 33.1 g/dL (ref 30.0–36.0)
MCV: 96.1 fl (ref 78.0–100.0)
MONOS PCT: 6.8 % (ref 3.0–12.0)
Monocytes Absolute: 0.5 10*3/uL (ref 0.1–1.0)
NEUTROS PCT: 61.9 % (ref 43.0–77.0)
Neutro Abs: 4.8 10*3/uL (ref 1.4–7.7)
PLATELETS: 330 10*3/uL (ref 150.0–400.0)
RBC: 4.24 Mil/uL (ref 3.87–5.11)
RDW: 13.7 % (ref 11.5–15.5)
WBC: 7.7 10*3/uL (ref 4.0–10.5)

## 2014-02-18 LAB — CREATININE, SERUM: CREATININE: 0.6 mg/dL (ref 0.4–1.2)

## 2014-02-19 ENCOUNTER — Ambulatory Visit: Payer: Commercial Managed Care - HMO | Admitting: Cardiology

## 2014-02-24 ENCOUNTER — Encounter: Payer: Self-pay | Admitting: Cardiology

## 2014-02-24 ENCOUNTER — Ambulatory Visit (INDEPENDENT_AMBULATORY_CARE_PROVIDER_SITE_OTHER): Payer: Commercial Managed Care - HMO | Admitting: Cardiology

## 2014-02-24 VITALS — BP 106/80 | HR 76 | Ht 65.0 in | Wt 152.0 lb

## 2014-02-24 DIAGNOSIS — Z7901 Long term (current) use of anticoagulants: Secondary | ICD-10-CM | POA: Diagnosis not present

## 2014-02-24 DIAGNOSIS — I4891 Unspecified atrial fibrillation: Secondary | ICD-10-CM | POA: Diagnosis not present

## 2014-02-24 DIAGNOSIS — I480 Paroxysmal atrial fibrillation: Secondary | ICD-10-CM

## 2014-02-24 NOTE — Progress Notes (Signed)
[place]. [address], [address] [place], [place]  [address] Phone: [phone] Fax:  [phone]  Date:  02/24/2014   ID:  Tonya Turner, DOB [birth year], MRN [id number]  PCP:  Irven Shelling, MD   History of Present Illness: Tonya Turner is a 73 y.o. female for paroxysmal atrial fibrillation followup. Very rare episode of AFIB. Once with a walk with a friend.  She is being protected with anticoagulation. She was very diligent about trying to find out the difference in price between oral anticoagulants.  Overall, she feels well. She's not had any stroke, bleeding, chest pain symptoms.  We went over her CHADS-VAS score and it is 2 -female, age greater than 37. Because of this, she deserves anticoagulation.   1. Normal LV size and function.2. There were no regional wall motion abnormalities.3. Left ventricular ejection fraction estimated by 2D at 60-65 percent. 4. Trace mitral valve regurgitation.5. Trivial tricuspid regurgitation  Has list of ?'s.  Was traveling for 4 weeks. Doing well. No syncope, no palpitations.  Wt Readings from Last 3 Encounters:  02/24/14 152 lb (68.947 kg)  08/19/13 155 lb (70.308 kg)  05/20/13 151 lb (68.493 kg)     Past Medical History  Diagnosis Date  . Dysrhythmia     hx. A.Fib.-tx. Xarelto  . Varicose vein     hx. of-   . Edema of extremities     usually lower extremities- bilateral  . Headache(784.0)     rare occular migraine- none recent  . Cancer     basal cell skin cancer lesions excised  . Arthritis     hands-mild    Past Surgical History  Procedure Laterality Date  . Varicose vein surgery      left  . Tubal ligation    . Colonoscopy with propofol N/A 10/28/2012    Procedure: COLONOSCOPY WITH PROPOFOL;  Surgeon: Garlan Fair, MD;  Location: [facility];  Service: Endoscopy;  Laterality: N/A;    Current Outpatient Prescriptions  Medication Sig Dispense Refill  . calcium-vitamin D (OSCAL WITH D) 500-200 MG-UNIT per  tablet Take 1 tablet by mouth daily.      Marland Kitchen conjugated estrogens (PREMARIN) vaginal cream Place 1 Applicatorful vaginally 2 (two) times a week.       . diltiazem (CARTIA XT) 180 MG 24 hr capsule Take 180 mg by mouth daily.      Marland Kitchen glucosamine-chondroitin 500-400 MG tablet Take 0.5 tablets by mouth 1 day or 1 dose.       . loratadine (CLARITIN) 10 MG tablet Take 10 mg by mouth daily.      . Multiple Vitamin (MULTIVITAMIN WITH MINERALS) TABS Take 0.5 tablets by mouth daily.      . rivaroxaban (XARELTO) 20 MG TABS tablet Take 1 tablet (20 mg total) by mouth daily with supper.  30 tablet  0   No current facility-administered medications for this visit.    Allergies:    Allergies  Allergen Reactions  . Other Rash    Adhesives-skin irritation  . Iodine Solution [Povidone Iodine] Dermatitis    Topical -causes skin inflammation    Social History:  The patient  reports that she quit smoking about 30 years ago. Her smoking use included Cigarettes. She has a 7.5 pack-year smoking history. She has never used smokeless tobacco. She reports that she drinks alcohol. She reports that she does not use illicit drugs.   ROS:  Please see the history of present illness.  Denies any bleeding, syncope, orthopnea, PND    PHYSICAL EXAM: VS:  BP 106/80  Pulse 76  Ht 5\' 5"  (1.651 m)  Wt 152 lb (68.947 kg)  BMI 25.29 kg/m2 Well nourished, well developed, in no acute distress HEENT: normal Neck: no JVD Cardiac:  normal S1, S2; RRR; no murmur Lungs:  clear to auscultation bilaterally, no wheezing, rhonchi or rales Abd: soft, nontender, no hepatomegaly Ext: no edema Skin: warm and dry Neuro: no focal abnormalities noted  EKG:  02/24/14-sinus rhythm, 76, no other abnormalities  ASSESSMENT AND PLAN:  1. Atrial fibrillation - paroxysmal. Doing very well. No changes. Continue with current medication. 2. Chronic anticoagulation-no changes made to medications. Check lab work every 6 months. Creatinine 0.6,  hemoglobin 13.5. Labs reviewed. Recently applied for long-term care insurance 3. Medication management-went over an extensive list of medications that can or cannot be used with her current medications. She appreciated this counseling. Once again discussed.  Signed, Candee Furbish, MD [facility]  02/24/2014 3:44 PM

## 2014-02-24 NOTE — Patient Instructions (Signed)
The current medical regimen is effective;  continue present plan and medications.  Please have blood work at your office visit (CBC and BMP).  Follow up in 6 months with Dr. Marlou Porch.  You will receive a letter in the mail 2 months before you are due.  Please call us when you receive this letter to schedule your follow up appointment.

## 2014-02-26 ENCOUNTER — Other Ambulatory Visit: Payer: Self-pay | Admitting: Cardiology

## 2014-03-16 ENCOUNTER — Telehealth: Payer: Self-pay | Admitting: Cardiology

## 2014-03-16 ENCOUNTER — Telehealth: Payer: Self-pay | Admitting: *Deleted

## 2014-03-16 NOTE — Telephone Encounter (Signed)
Xarelto samples placed at the front desk for patient. 

## 2014-03-16 NOTE — Telephone Encounter (Deleted)
Errors

## 2014-05-05 ENCOUNTER — Other Ambulatory Visit: Payer: Self-pay

## 2014-05-05 MED ORDER — RIVAROXABAN 20 MG PO TABS: 20.0000 mg | ORAL_TABLET | Freq: Every day | ORAL | Status: DC

## 2014-06-01 ENCOUNTER — Other Ambulatory Visit: Payer: Self-pay | Admitting: Dermatology

## 2014-06-01 ENCOUNTER — Telehealth: Payer: Self-pay | Admitting: Cardiology

## 2014-06-01 DIAGNOSIS — D485 Neoplasm of uncertain behavior of skin: Secondary | ICD-10-CM | POA: Diagnosis not present

## 2014-06-01 DIAGNOSIS — L821 Other seborrheic keratosis: Secondary | ICD-10-CM | POA: Diagnosis not present

## 2014-06-01 DIAGNOSIS — L309 Dermatitis, unspecified: Secondary | ICD-10-CM | POA: Diagnosis not present

## 2014-06-01 DIAGNOSIS — L723 Sebaceous cyst: Secondary | ICD-10-CM | POA: Diagnosis not present

## 2014-06-01 NOTE — Telephone Encounter (Signed)
New problem   Pt has a question about a letter she received about scheduling an appt for April and April schedule isn't released yet. Pt is very upset.

## 2014-06-01 NOTE — Telephone Encounter (Signed)
Reviewed phone note with Neil Crouch and Emeline Darling, RN.  Gina to call pt.

## 2014-06-22 ENCOUNTER — Telehealth: Payer: Self-pay | Admitting: Cardiology

## 2014-06-22 DIAGNOSIS — I480 Paroxysmal atrial fibrillation: Secondary | ICD-10-CM

## 2014-06-22 DIAGNOSIS — T45515D Adverse effect of anticoagulants, subsequent encounter: Secondary | ICD-10-CM

## 2014-06-22 NOTE — Telephone Encounter (Signed)
Pt is aware of labs 3/23 rd  and F/U visit with Dr. Marlou Porch on 3/30 .

## 2014-06-22 NOTE — Telephone Encounter (Signed)
Follow Up  Pt called for labs// Please put in orders/Pt req labs /sr

## 2014-07-01 ENCOUNTER — Ambulatory Visit (INDEPENDENT_AMBULATORY_CARE_PROVIDER_SITE_OTHER): Payer: Commercial Managed Care - HMO

## 2014-07-01 ENCOUNTER — Ambulatory Visit (INDEPENDENT_AMBULATORY_CARE_PROVIDER_SITE_OTHER): Payer: Commercial Managed Care - HMO | Admitting: Podiatry

## 2014-07-01 ENCOUNTER — Encounter: Payer: Self-pay | Admitting: Podiatry

## 2014-07-01 VITALS — Ht 65.25 in | Wt 150.0 lb

## 2014-07-01 DIAGNOSIS — M2011 Hallux valgus (acquired), right foot: Secondary | ICD-10-CM | POA: Diagnosis not present

## 2014-07-01 DIAGNOSIS — M79673 Pain in unspecified foot: Secondary | ICD-10-CM

## 2014-07-01 DIAGNOSIS — M2041 Other hammer toe(s) (acquired), right foot: Secondary | ICD-10-CM

## 2014-07-01 DIAGNOSIS — M21611 Bunion of right foot: Secondary | ICD-10-CM

## 2014-07-01 DIAGNOSIS — L239 Allergic contact dermatitis, unspecified cause: Secondary | ICD-10-CM | POA: Diagnosis not present

## 2014-07-01 DIAGNOSIS — M779 Enthesopathy, unspecified: Secondary | ICD-10-CM

## 2014-07-01 NOTE — Progress Notes (Signed)
Subjective:     Patient ID: Tonya Turner, female   DOB: 02/25/1941, 74 y.o.   MRN: [id number]  HPI patient presents stating I have pain in the base and my fifth metatarsal right over left with enlargement noted and trouble with certain shoes. Also notes that's her structural hammertoe and bunion deformity she is still getting lesions between the big toe and second toe and wants to know what to do is secondary problem   Review of Systems  All other systems reviewed and are negative.      Objective:   Physical Exam  Musculoskeletal: Normal range of motion.  Skin: Skin is warm.  Nursing note and vitals reviewed.  neurovascular status intact with muscle strength adequate and range of motion subtalar and midtarsal joint within normal limits.    patient is noted to have inflammation and pain base of fifth metatarsal right over left with fluid buildup and insertional irritation of peroneal brevis. Muscle strength testing was normal  Assessment:     Tendinitis of the insertion of the peroneal tendon right over left base of fifth metatarsal and structural hammertoe deformity bunion deformity right over left     Plan:     H&P and x-rays reviewed. For the bunion hammertoe we will utilize pad which were dispensed and I instructed on gradual increase in thickness of the pads and possibility long-term for structural correction. I then went ahead and injected the base of the fifth metatarsal right 3 mg Kenalog 5 g Xylocaine and advised on reduced activity. Reappoint to recheck

## 2014-07-01 NOTE — Progress Notes (Signed)
   Subjective:    Patient ID: Tonya Turner, female    DOB: 1941/04/24, 74 y.o.   MRN: [id number]  HPI Comments: Pt states she noticed a gradual increase in size and sensitivities in the right > left 5th proximal Metatarsal end, since Fall 2016.  Foot Pain      Review of Systems  All other systems reviewed and are negative.      Objective:   Physical Exam        Assessment & Plan:

## 2014-07-15 DIAGNOSIS — Z0000 Encounter for general adult medical examination without abnormal findings: Secondary | ICD-10-CM | POA: Diagnosis not present

## 2014-07-15 DIAGNOSIS — Z23 Encounter for immunization: Secondary | ICD-10-CM | POA: Diagnosis not present

## 2014-07-15 DIAGNOSIS — Z5181 Encounter for therapeutic drug level monitoring: Secondary | ICD-10-CM | POA: Diagnosis not present

## 2014-07-15 DIAGNOSIS — Z1389 Encounter for screening for other disorder: Secondary | ICD-10-CM | POA: Diagnosis not present

## 2014-08-18 ENCOUNTER — Other Ambulatory Visit (INDEPENDENT_AMBULATORY_CARE_PROVIDER_SITE_OTHER): Payer: Commercial Managed Care - HMO | Admitting: *Deleted

## 2014-08-18 DIAGNOSIS — T45515D Adverse effect of anticoagulants, subsequent encounter: Secondary | ICD-10-CM

## 2014-08-18 DIAGNOSIS — I480 Paroxysmal atrial fibrillation: Secondary | ICD-10-CM

## 2014-08-18 LAB — CBC WITH DIFFERENTIAL/PLATELET
Basophils Absolute: 0.1 K/uL (ref 0.0–0.1)
Basophils Relative: 0.8 % (ref 0.0–3.0)
Eosinophils Absolute: 0 K/uL (ref 0.0–0.7)
Eosinophils Relative: 0.6 % (ref 0.0–5.0)
HCT: 41.1 % (ref 36.0–46.0)
Hemoglobin: 13.9 g/dL (ref 12.0–15.0)
Lymphocytes Relative: 27.2 % (ref 12.0–46.0)
Lymphs Abs: 1.7 K/uL (ref 0.7–4.0)
MCHC: 33.8 g/dL (ref 30.0–36.0)
MCV: 94.3 fl (ref 78.0–100.0)
Monocytes Absolute: 0.5 K/uL (ref 0.1–1.0)
Monocytes Relative: 8.1 % (ref 3.0–12.0)
Neutro Abs: 4 K/uL (ref 1.4–7.7)
Neutrophils Relative %: 63.3 % (ref 43.0–77.0)
Platelets: 347 K/uL (ref 150.0–400.0)
RBC: 4.36 Mil/uL (ref 3.87–5.11)
RDW: 14 % (ref 11.5–15.5)
WBC: 6.4 K/uL (ref 4.0–10.5)

## 2014-08-18 LAB — BASIC METABOLIC PANEL
BUN: 11 mg/dL (ref 6–23)
CHLORIDE: 101 meq/L (ref 96–112)
CO2: 32 mEq/L (ref 19–32)
Calcium: 9.4 mg/dL (ref 8.4–10.5)
Creatinine, Ser: 0.72 mg/dL (ref 0.40–1.20)
GFR: 83.98 mL/min (ref >60.00)
GLUCOSE: 94 mg/dL (ref 70–99)
Potassium: 4.4 mEq/L (ref 3.5–5.1)
SODIUM: 136 meq/L (ref 135–145)

## 2014-08-25 ENCOUNTER — Ambulatory Visit (INDEPENDENT_AMBULATORY_CARE_PROVIDER_SITE_OTHER): Payer: Commercial Managed Care - HMO | Admitting: Cardiology

## 2014-08-25 ENCOUNTER — Encounter: Payer: Self-pay | Admitting: Cardiology

## 2014-08-25 VITALS — BP 112/80 | HR 67 | Ht 65.25 in | Wt 154.0 lb

## 2014-08-25 DIAGNOSIS — Z79899 Other long term (current) drug therapy: Secondary | ICD-10-CM | POA: Insufficient documentation

## 2014-08-25 DIAGNOSIS — I480 Paroxysmal atrial fibrillation: Secondary | ICD-10-CM

## 2014-08-25 DIAGNOSIS — Z7901 Long term (current) use of anticoagulants: Secondary | ICD-10-CM | POA: Diagnosis not present

## 2014-08-25 MED ORDER — DILTIAZEM HCL 30 MG PO TABS: 30.0000 mg | ORAL_TABLET | ORAL | Status: DC | PRN

## 2014-08-25 MED ORDER — RIVAROXABAN 20 MG PO TABS: 20.0000 mg | ORAL_TABLET | Freq: Every day | ORAL | Status: DC

## 2014-08-25 NOTE — Patient Instructions (Signed)
Your physician has recommended you make the following change in your medication:  1) You may take Diltiazem 30mg  every 6 hours as needed for episodes of rapid heart beat/afib  Your physician wants you to follow-up in: 6 months with Dr. Marlou Porch. You will receive a reminder letter in the mail two months in advance. If you don't receive a letter, please call our office to schedule the follow-up appointment.

## 2014-08-25 NOTE — Progress Notes (Signed)
[place]. [address], [address] [place], [place]  [address] Phone: [phone] Fax:  [phone]  Date:  08/25/2014   ID:  Tonya Turner, DOB January 23, 1941, MRN [id number]  PCP:  Irven Shelling, MD   History of Present Illness: Tonya Turner is a 74 y.o. female for paroxysmal atrial fibrillation followup. Very rare episode of AFIB. Once she experienced when she was walking with a  friend. Has happened when working out at [employer]. She sits down. Drinks water. She is being protected with anticoagulation. She was very diligent about trying to find out the difference in price between oral anticoagulants.  Overall, she feels well. She's not had any stroke, bleeding, chest pain symptoms.  We went over her CHADS-VAS score and it is 2 -female, age greater than 45. Because of this, she deserves anticoagulation. She is taking Xarelto, expensive.  Blood work has been reassuring.  1. Normal LV size and function.2. There were no regional wall motion abnormalities.3. Left ventricular ejection fraction estimated by 2D at 60-65 percent. 4. Trace mitral valve regurgitation.5. Trivial tricuspid regurgitation.     Wt Readings from Last 3 Encounters:  08/25/14 154 lb (69.854 kg)  07/01/14 150 lb (68.04 kg)  02/24/14 152 lb (68.947 kg)     Past Medical History  Diagnosis Date  . Dysrhythmia     hx. A.Fib.-tx. Xarelto  . Varicose vein     hx. of-   . Edema of extremities     usually lower extremities- bilateral  . Headache(784.0)     rare occular migraine- none recent  . Cancer     basal cell skin cancer lesions excised  . Arthritis     hands-mild    Past Surgical History  Procedure Laterality Date  . Varicose vein surgery      left  . Tubal ligation    . Colonoscopy with propofol N/A 10/28/2012    Procedure: COLONOSCOPY WITH PROPOFOL;  Surgeon: Garlan Fair, MD;  Location: [facility];  Service: Endoscopy;  Laterality: N/A;    Current Outpatient Prescriptions  Medication  Sig Dispense Refill  . calcium-vitamin D (OSCAL WITH D) 500-200 MG-UNIT per tablet Take 1 tablet by mouth daily.    Marland Kitchen CARTIA XT 180 MG 24 hr capsule TAKE 1 CAPSULE EVERY MORNING 90 capsule 1  . conjugated estrogens (PREMARIN) vaginal cream Place 1 Applicatorful vaginally 2 (two) times a week.     Marland Kitchen glucosamine-chondroitin 500-400 MG tablet Take 0.5 tablets by mouth 1 day or 1 dose.     . Multiple Vitamin (MULTIVITAMIN WITH MINERALS) TABS Take 0.5 tablets by mouth daily.    . rivaroxaban (XARELTO) 20 MG TABS tablet Take 1 tablet (20 mg total) by mouth daily with supper. 90 tablet 1   No current facility-administered medications for this visit.    Allergies:    Allergies  Allergen Reactions  . Other Rash    Adhesives-skin irritation  . Iodine Solution [Povidone Iodine] Dermatitis    Topical -causes skin inflammation    Social History:  The patient  reports that she quit smoking about 30 years ago. Her smoking use included Cigarettes. She has a 7.5 pack-year smoking history. She has never used smokeless tobacco. She reports that she drinks alcohol. She reports that she does not use illicit drugs.   ROS:  Please see the history of present illness.  Positive for ocular migraines, irregular heartbeats, rash, contact dermatitis, easy bruising. Denies any bleeding, syncope, orthopnea, PND  PHYSICAL EXAM: VS:  BP 112/80 mmHg  Pulse 67  Ht 5' 5.25" (1.657 m)  Wt 154 lb (69.854 kg)  BMI 25.44 kg/m2 Well nourished, well developed, in no acute distress HEENT: normal Neck: no JVD Cardiac:  normal S1, S2; RRR; no murmur Lungs:  clear to auscultation bilaterally, no wheezing, rhonchi or rales Abd: soft, nontender, no hepatomegaly Ext: no edema2+ pulses Skin: warm and dry Neuro: no focal abnormalities noted  EKG:  02/24/14-sinus rhythm, 76, no other abnormalities  ASSESSMENT AND PLAN:  1. Atrial fibrillation - paroxysmal. Doing very well. No changes. Continue with current medication. We  have added short-acting diltiazem 30 mg by mouth every 6 hours to be taken on as-needed basis if she does have an episode of atrial fibrillation. 2. Chronic anticoagulation-no changes made to medications. Check lab work every 6 months. Lab work reviewed and reassuring. Creatinine 0.6, hemoglobin 13.5. Labs reviewed. Recently applied for long-term care insurance 3. Medication management-previous went over an extensive list of medications that can or cannot be used with her current medications. She appreciated this counseling. Once again discussed.  Signed, Candee Furbish, MD [facility]  08/25/2014 2:27 PM

## 2014-08-28 ENCOUNTER — Other Ambulatory Visit: Payer: Self-pay | Admitting: Cardiology

## 2014-08-30 ENCOUNTER — Other Ambulatory Visit: Payer: Self-pay | Admitting: *Deleted

## 2014-08-30 MED ORDER — RIVAROXABAN 20 MG PO TABS: 20.0000 mg | ORAL_TABLET | Freq: Every day | ORAL | Status: DC

## 2014-08-31 ENCOUNTER — Encounter: Payer: Self-pay | Admitting: Cardiology

## 2014-09-08 ENCOUNTER — Telehealth: Payer: Self-pay | Admitting: Cardiology

## 2014-09-08 NOTE — Telephone Encounter (Signed)
Pt called to say she tried to use the 30 day RX card for Xarelto however she was told it is for a once in a lifetime - not once a year.  She reports her Xarelto is $131 a month and she will be in the [place] before the end of the year.  Advised we would help her with samples as possible however they are not always available.  She states understanding.  She has also been on Pradaxa and Eliquis in the past and neither of these medications are any less expensive.  She does not want to change to coumadin d/t dietary restrictions.

## 2014-09-08 NOTE — Telephone Encounter (Signed)
New message      Pt c/o medication issue:  1. Name of Medication: xarelto 2. How are you currently taking this medication (dosage and times per day)? 20mg  3. Are you having a reaction (difficulty breathing--STAT)?  no 4. What is your medication issue?  Pt says the xarelto care package trial offer for people in [place] is for once per lifetime.  She wanted the nurse to know.  Pt also request to talk to the nurse

## 2014-09-17 ENCOUNTER — Telehealth: Payer: Self-pay | Admitting: Cardiology

## 2014-09-17 NOTE — Telephone Encounter (Signed)
New message        Pt would like to thank you for the samples.

## 2014-10-22 ENCOUNTER — Ambulatory Visit (INDEPENDENT_AMBULATORY_CARE_PROVIDER_SITE_OTHER): Payer: Commercial Managed Care - HMO | Admitting: Podiatry

## 2014-10-22 ENCOUNTER — Encounter: Payer: Self-pay | Admitting: Podiatry

## 2014-10-22 VITALS — BP 123/72 | HR 73 | Resp 15

## 2014-10-22 DIAGNOSIS — M779 Enthesopathy, unspecified: Secondary | ICD-10-CM

## 2014-10-22 DIAGNOSIS — L84 Corns and callosities: Secondary | ICD-10-CM | POA: Diagnosis not present

## 2014-10-22 DIAGNOSIS — M2041 Other hammer toe(s) (acquired), right foot: Secondary | ICD-10-CM | POA: Diagnosis not present

## 2014-10-22 MED ORDER — TRIAMCINOLONE ACETONIDE 10 MG/ML IJ SUSP
10.0000 mg | Freq: Once | INTRAMUSCULAR | Status: AC
  Administered 2014-10-22: 10 mg

## 2014-10-23 NOTE — Progress Notes (Signed)
Subjective:     Patient ID: Tonya Turner, female   DOB: 12-05-1940, 74 y.o.   MRN: [id number]  HPI patient presents with pain in the right second toe and lesions underneath the right fifth metatarsal. Also states that the other joint of her metatarsal bone is getting very tender right foot   Review of Systems     Objective:   Physical Exam Neurovascular status intact muscle strength adequate with inflammation of the right second metatarsal phalangeal joint with arthritic-like feeling to the joint and inflammation of the interphalangeal joint of the right second toe with rigid contracture of the digit noted. Also noted to have several keratotic lesion sub-fifth metatarsal head right foot that are symptomatic and painful    Assessment:     Hammertoe deformity second digit right with rigid contracture and inflammation of the second MPJ noted. Inflammation of the interphalangeal joint of the right second toe that's painful when pressed    Plan:     H&P and conditions reviewed with patient. At this point I have recommended aggressive treatment of the second MPJ and toe due to pain and I did a proximal nerve block anesthetizing the entire area drew fluid out of the second MPJ and injected the capsule with a quarter cc of dexamethasone Kenalog. I then went ahead and separately injected the interphalangeal joint to milligrams dexamethasone Kenalog 1 mg Xylocaine and applied padding. Debrided lesions on the plantar aspect of the right fifth metatarsal

## 2014-11-26 DIAGNOSIS — H0012 Chalazion right lower eyelid: Secondary | ICD-10-CM | POA: Diagnosis not present

## 2014-12-10 ENCOUNTER — Other Ambulatory Visit (HOSPITAL_COMMUNITY): Payer: Self-pay | Admitting: Obstetrics and Gynecology

## 2014-12-10 DIAGNOSIS — Z1231 Encounter for screening mammogram for malignant neoplasm of breast: Secondary | ICD-10-CM

## 2014-12-13 DIAGNOSIS — M25561 Pain in right knee: Secondary | ICD-10-CM | POA: Diagnosis not present

## 2014-12-14 DIAGNOSIS — D239 Other benign neoplasm of skin, unspecified: Secondary | ICD-10-CM | POA: Diagnosis not present

## 2014-12-14 DIAGNOSIS — L821 Other seborrheic keratosis: Secondary | ICD-10-CM | POA: Diagnosis not present

## 2014-12-14 DIAGNOSIS — H01009 Unspecified blepharitis unspecified eye, unspecified eyelid: Secondary | ICD-10-CM | POA: Diagnosis not present

## 2014-12-23 ENCOUNTER — Ambulatory Visit (HOSPITAL_COMMUNITY)
Admission: RE | Admit: 2014-12-23 | Discharge: 2014-12-23 | Disposition: A | Discharge status: Home / Self Care | Payer: Commercial Managed Care - HMO | Source: Ambulatory Visit | Attending: Obstetrics and Gynecology | Admitting: Obstetrics and Gynecology

## 2014-12-23 DIAGNOSIS — Z1231 Encounter for screening mammogram for malignant neoplasm of breast: Secondary | ICD-10-CM | POA: Insufficient documentation

## 2014-12-28 ENCOUNTER — Telehealth: Payer: Self-pay | Admitting: Cardiology

## 2014-12-28 DIAGNOSIS — Z7901 Long term (current) use of anticoagulants: Secondary | ICD-10-CM

## 2014-12-28 DIAGNOSIS — I480 Paroxysmal atrial fibrillation: Secondary | ICD-10-CM

## 2014-12-28 NOTE — Telephone Encounter (Signed)
Will ask Dr Marlou Porch and call pt back.  She is aware she will need a CBC and that can be drawn at the time of her appt.

## 2014-12-28 NOTE — Telephone Encounter (Signed)
New Message      Pt calling wanting to know if she needs labs done before her appt, no orders in Epic. Pt would like a call back to let her know.

## 2014-12-29 NOTE — Telephone Encounter (Signed)
Every 6 months should have CBC and BMET (Xarelto). Go ahead and order these if not already done by Dr. Laurann Montana.  Candee Furbish, MD

## 2014-12-30 NOTE — Telephone Encounter (Signed)
Left message for pt to call back  °

## 2015-01-03 NOTE — Telephone Encounter (Signed)
Follow up      Patient is returning [name]'s call.  After starting message, pt would like to call back tomorrow and ask for [name].  You do not have to call pt back.

## 2015-01-05 ENCOUNTER — Telehealth: Payer: Self-pay | Admitting: Cardiology

## 2015-01-05 NOTE — Telephone Encounter (Signed)
See phone note from 12/28/14.

## 2015-01-05 NOTE — Telephone Encounter (Signed)
New message     Pt returning [name]'s call from last week

## 2015-01-05 NOTE — Telephone Encounter (Signed)
Spoke with pt and informed her that Dr. Marlou Porch would like to check CBC and BMET if Dr. Laurann Montana hasnt recently. Last scanned records from Dr. Delene Ruffini office were for 06/2014 and pt said that is the last time she has went there. Informed pt that we would need to recheck the labs then. Scheduled appt for 10/13. Pt verbalized understanding and was in agreement with this plan.

## 2015-01-21 ENCOUNTER — Other Ambulatory Visit: Payer: Self-pay | Admitting: Cardiology

## 2015-02-24 ENCOUNTER — Ambulatory Visit: Payer: Commercial Managed Care - HMO | Admitting: Cardiology

## 2015-03-10 ENCOUNTER — Other Ambulatory Visit (INDEPENDENT_AMBULATORY_CARE_PROVIDER_SITE_OTHER): Payer: Commercial Managed Care - HMO | Admitting: *Deleted

## 2015-03-10 DIAGNOSIS — I4891 Unspecified atrial fibrillation: Secondary | ICD-10-CM | POA: Diagnosis not present

## 2015-03-10 DIAGNOSIS — R600 Localized edema: Secondary | ICD-10-CM

## 2015-03-10 DIAGNOSIS — R609 Edema, unspecified: Secondary | ICD-10-CM | POA: Diagnosis not present

## 2015-03-10 DIAGNOSIS — I480 Paroxysmal atrial fibrillation: Secondary | ICD-10-CM | POA: Diagnosis not present

## 2015-03-10 LAB — CBC WITH DIFFERENTIAL/PLATELET
Basophils Absolute: 0.1 10*3/uL (ref 0.0–0.1)
Basophils Relative: 1 % (ref 0–1)
EOS ABS: 0.1 10*3/uL (ref 0.0–0.7)
Eosinophils Relative: 1 % (ref 0–5)
HCT: 39.7 % (ref 36.0–46.0)
HEMOGLOBIN: 13.4 g/dL (ref 12.0–15.0)
LYMPHS ABS: 2 10*3/uL (ref 0.7–4.0)
Lymphocytes Relative: 24 % (ref 12–46)
MCH: 31.6 pg (ref 26.0–34.0)
MCHC: 33.8 g/dL (ref 30.0–36.0)
MCV: 93.6 fL (ref 78.0–100.0)
MONOS PCT: 7 % (ref 3–12)
MPV: 9.1 fL (ref 8.6–12.4)
Monocytes Absolute: 0.6 10*3/uL (ref 0.1–1.0)
NEUTROS ABS: 5.6 10*3/uL (ref 1.7–7.7)
Neutrophils Relative %: 67 % (ref 43–77)
Platelets: 349 10*3/uL (ref 150–400)
RBC: 4.24 MIL/uL (ref 3.87–5.11)
RDW: 13.9 % (ref 11.5–15.5)
WBC: 8.3 10*3/uL (ref 4.0–10.5)

## 2015-03-10 LAB — BASIC METABOLIC PANEL
BUN: 13 mg/dL (ref 7–25)
CALCIUM: 9.1 mg/dL (ref 8.6–10.4)
CO2: 25 mmol/L (ref 20–31)
Chloride: 102 mmol/L (ref 98–110)
Creat: 0.66 mg/dL (ref 0.60–0.93)
Glucose, Bld: 86 mg/dL (ref 65–99)
Potassium: 3.9 mmol/L (ref 3.5–5.3)
Sodium: 138 mmol/L (ref 135–146)

## 2015-03-10 NOTE — Addendum Note (Signed)
Addended by: Eulis Foster on: 03/10/2015 01:45 PM   Modules accepted: Orders

## 2015-03-14 ENCOUNTER — Encounter: Payer: Self-pay | Admitting: Cardiology

## 2015-03-14 ENCOUNTER — Ambulatory Visit (INDEPENDENT_AMBULATORY_CARE_PROVIDER_SITE_OTHER): Payer: Commercial Managed Care - HMO | Admitting: Cardiology

## 2015-03-14 VITALS — BP 122/72 | HR 67 | Ht 65.0 in | Wt 156.2 lb

## 2015-03-14 DIAGNOSIS — I480 Paroxysmal atrial fibrillation: Secondary | ICD-10-CM | POA: Diagnosis not present

## 2015-03-14 DIAGNOSIS — Z7901 Long term (current) use of anticoagulants: Secondary | ICD-10-CM | POA: Diagnosis not present

## 2015-03-14 NOTE — Progress Notes (Signed)
[name]. [address] [place], [place]  [address] Phone: [phone] Fax:  [phone]  Date:  03/14/2015   ID:  Tonya Turner, DOB 03-14-1941, MRN [id number]  PCP:  Irven Shelling, MD   History of Present Illness: Tonya Turner is a 74 y.o. female for paroxysmal atrial fibrillation followup. Very rare episode of AFIB. Once she experienced when she was walking with a  friend. Has happened when working out at [employer]. She sits down. Drinks water. She is being protected with anticoagulation. She was very diligent about trying to find out the difference in price between oral anticoagulants.  Overall, she feels well. She's not had any stroke, bleeding, chest pain symptoms.  We went over her CHADS-VAS score and it is 2 -female, age greater than 13. Because of this, she deserves anticoagulation. She is taking Xarelto, expensive.  Blood work has been reassuring.  1. Normal LV size and function.2. There were no regional wall motion abnormalities.3. Left ventricular ejection fraction estimated by 2D at 60-65 percent. 4. Trace mitral valve regurgitation.5. Trivial tricuspid regurgitation.  6 min episode. Overall doing well. Takes extra diltiazem if necessary. She gained some weight after eating breads especially on. [place] trip.   Wt Readings from Last 3 Encounters:  03/14/15 156 lb 3.2 oz (70.852 kg)  08/25/14 154 lb (69.854 kg)  07/01/14 150 lb (68.04 kg)     Past Medical History  Diagnosis Date  . Dysrhythmia     hx. A.Fib.-tx. Xarelto  . Varicose vein     hx. of-   . Edema of extremities     usually lower extremities- bilateral  . Headache(784.0)     rare occular migraine- none recent  . Cancer ([place])     basal cell skin cancer lesions excised  . Arthritis     hands-mild    Past Surgical History  Procedure Laterality Date  . Varicose vein surgery      left  . Tubal ligation    . Colonoscopy with propofol N/A 10/28/2012    Procedure: COLONOSCOPY WITH  PROPOFOL;  Surgeon: Garlan Fair, MD;  Location: [facility];  Service: Endoscopy;  Laterality: N/A;    Current Outpatient Prescriptions  Medication Sig Dispense Refill  . calcium-vitamin D (OSCAL WITH D) 500-200 MG-UNIT per tablet Take 1 tablet by mouth daily.    Marland Kitchen CARTIA XT 180 MG 24 hr capsule TAKE 1 CAPSULE EVERY MORNING 90 capsule 0  . conjugated estrogens (PREMARIN) vaginal cream Place 1 Applicatorful vaginally 2 (two) times a week.     . diltiazem (CARDIZEM) 30 MG tablet Take 1 tablet (30 mg total) by mouth as needed (every 6 hours as needed for episode of rapid heartbeat). 120 tablet 6  . glucosamine-chondroitin 500-400 MG tablet Take 0.5 tablets by mouth 1 day or 1 dose.     . Multiple Vitamin (MULTIVITAMIN WITH MINERALS) TABS Take 0.5 tablets by mouth daily.    Alveda Reasons 20 MG TABS tablet TAKE 1 TABLET (20 MG TOTAL) BY MOUTH DAILY WITH SUPPER. 90 tablet 0   No current facility-administered medications for this visit.    Allergies:    Allergies  Allergen Reactions  . Other Rash    Adhesives-skin irritation  . Iodine Solution [Povidone Iodine] Dermatitis    Topical -causes skin inflammation    Social History:  The patient  reports that she quit smoking about 31 years ago. Her smoking use included Cigarettes. She has a 7.5 pack-year  smoking history. She has never used smokeless tobacco. She reports that she drinks alcohol. She reports that she does not use illicit drugs.   ROS:  Please see the history of present illness.  Positive for ocular migraines, irregular heartbeats, rash, contact dermatitis, easy bruising. Denies any bleeding, syncope, orthopnea, PND    PHYSICAL EXAM: VS:  BP 122/72 mmHg  Pulse 67  Ht 5\' 5"  (1.651 m)  Wt 156 lb 3.2 oz (70.852 kg)  BMI 25.99 kg/m2 Well nourished, well developed, in no acute distress HEENT: normal Neck: no JVD Cardiac:  normal S1, S2; RRR; no murmur Lungs:  clear to auscultation bilaterally, no wheezing, rhonchi or rales Abd:  soft, nontender, no hepatomegaly Ext: no edema2+ pulses Skin: warm and dry Neuro: no focal abnormalities noted  EKG:  Today 03/14/15-sinus rhythm, 67, left atrial enlargement, personally viewed-prior 02/24/14-sinus rhythm, 76, no other abnormalities  Labwork reviewed, stable kidney function and hemoglobin  ASSESSMENT AND PLAN:  1. Atrial fibrillation - paroxysmal. Doing very well. No changes. She's had a few brief episodes usually 5 minutes duration. Takes an extra diltiazem when necessary. Continue with current medication. We have added short-acting diltiazem 30 mg by mouth every 6 hours to be taken on as-needed basis if she does have an episode of atrial fibrillation. No obvious triggers. 2. Chronic anticoagulation-no changes made to medications. Check lab work every 6 months. Lab work reviewed and reassuring. Creatinine 0.6, hemoglobin 13.5. Labs reviewed. Previously applied for long-term care insurance 3. Discussed her medications. CBC and basic metabolic profile 4. Six-month follow-up  Signed, Candee Furbish, MD [facility]  03/14/2015 2:19 PM

## 2015-03-14 NOTE — Patient Instructions (Addendum)
Medication Instructions:  The current medical regimen is effective;  continue present plan and medications.  Follow-Up: Follow up in 6 months with Dr. Skains.  You will receive a letter in the mail 2 months before you are due.  Please call us when you receive this letter to schedule your follow up appointment.  Thank you for choosing [facility]!!     

## 2015-03-16 ENCOUNTER — Telehealth: Payer: Self-pay | Admitting: Cardiology

## 2015-03-16 NOTE — Telephone Encounter (Signed)
Pt is aware of lab results.

## 2015-03-16 NOTE — Telephone Encounter (Signed)
New message ° ° ° ° ° °Returning a nurses call to get test results °

## 2015-03-26 ENCOUNTER — Other Ambulatory Visit: Payer: Self-pay | Admitting: Cardiology

## 2015-05-10 DIAGNOSIS — H40011 Open angle with borderline findings, low risk, right eye: Secondary | ICD-10-CM | POA: Diagnosis not present

## 2015-05-10 DIAGNOSIS — H40012 Open angle with borderline findings, low risk, left eye: Secondary | ICD-10-CM | POA: Diagnosis not present

## 2015-05-10 DIAGNOSIS — H52203 Unspecified astigmatism, bilateral: Secondary | ICD-10-CM | POA: Diagnosis not present

## 2015-05-10 DIAGNOSIS — H2513 Age-related nuclear cataract, bilateral: Secondary | ICD-10-CM | POA: Diagnosis not present

## 2015-06-11 ENCOUNTER — Other Ambulatory Visit: Payer: Self-pay | Admitting: Cardiology

## 2015-06-29 ENCOUNTER — Ambulatory Visit: Payer: Self-pay

## 2015-06-29 ENCOUNTER — Encounter: Payer: Self-pay | Admitting: Podiatry

## 2015-06-29 ENCOUNTER — Ambulatory Visit (INDEPENDENT_AMBULATORY_CARE_PROVIDER_SITE_OTHER): Payer: Commercial Managed Care - HMO | Admitting: Podiatry

## 2015-06-29 DIAGNOSIS — L84 Corns and callosities: Secondary | ICD-10-CM | POA: Diagnosis not present

## 2015-06-29 DIAGNOSIS — M2041 Other hammer toe(s) (acquired), right foot: Secondary | ICD-10-CM | POA: Diagnosis not present

## 2015-06-29 DIAGNOSIS — M779 Enthesopathy, unspecified: Secondary | ICD-10-CM

## 2015-06-29 NOTE — Progress Notes (Signed)
Subjective:     Patient ID: Tonya Turner, female   DOB: 01-26-1941, 75 y.o.   MRN: [id number]  HPI patient is concerned because she feels like there is a lump in the bottom of her right foot and while it's not hurting her a lot she's very aware of   Review of Systems     Objective:   Physical Exam  neurovascular status intact with significant forefoot derangement noted with prominent metatarsal second right secondary to structural malalignment    Assessment:      most likely has a plantarflexed tight metatarsal with bony prominence creating the condition she is experiencing    Plan:      H&P and condition reviewed with patient and recommended long-term padding therapy and is long as it doesn't hurt we will just watch it. May require other treatments at one point in future

## 2015-07-07 ENCOUNTER — Other Ambulatory Visit: Payer: Self-pay | Admitting: Internal Medicine

## 2015-07-07 ENCOUNTER — Ambulatory Visit
Admission: RE | Admit: 2015-07-07 | Discharge: 2015-07-07 | Disposition: A | Discharge status: Home / Self Care | Payer: Commercial Managed Care - HMO | Source: Ambulatory Visit | Attending: Internal Medicine | Admitting: Internal Medicine

## 2015-07-07 DIAGNOSIS — M1612 Unilateral primary osteoarthritis, left hip: Secondary | ICD-10-CM | POA: Diagnosis not present

## 2015-07-07 DIAGNOSIS — R5383 Other fatigue: Secondary | ICD-10-CM | POA: Diagnosis not present

## 2015-07-07 DIAGNOSIS — M25552 Pain in left hip: Secondary | ICD-10-CM

## 2015-07-22 DIAGNOSIS — Z1389 Encounter for screening for other disorder: Secondary | ICD-10-CM | POA: Diagnosis not present

## 2015-07-22 DIAGNOSIS — Z0000 Encounter for general adult medical examination without abnormal findings: Secondary | ICD-10-CM | POA: Diagnosis not present

## 2015-07-25 DIAGNOSIS — M25552 Pain in left hip: Secondary | ICD-10-CM | POA: Diagnosis not present

## 2015-07-27 DIAGNOSIS — M25552 Pain in left hip: Secondary | ICD-10-CM | POA: Diagnosis not present

## 2015-08-03 ENCOUNTER — Other Ambulatory Visit: Payer: Self-pay | Admitting: Orthopaedic Surgery

## 2015-08-03 DIAGNOSIS — M8700 Idiopathic aseptic necrosis of unspecified bone: Secondary | ICD-10-CM

## 2015-08-04 DIAGNOSIS — M25552 Pain in left hip: Secondary | ICD-10-CM | POA: Diagnosis not present

## 2015-08-09 DIAGNOSIS — M25552 Pain in left hip: Secondary | ICD-10-CM | POA: Diagnosis not present

## 2015-08-11 DIAGNOSIS — M25552 Pain in left hip: Secondary | ICD-10-CM | POA: Diagnosis not present

## 2015-08-13 ENCOUNTER — Ambulatory Visit
Admission: RE | Admit: 2015-08-13 | Discharge: 2015-08-13 | Disposition: A | Discharge status: Home / Self Care | Payer: Commercial Managed Care - HMO | Source: Ambulatory Visit | Attending: Orthopaedic Surgery | Admitting: Orthopaedic Surgery

## 2015-08-13 DIAGNOSIS — M8700 Idiopathic aseptic necrosis of unspecified bone: Secondary | ICD-10-CM

## 2015-08-13 DIAGNOSIS — M25452 Effusion, left hip: Secondary | ICD-10-CM | POA: Diagnosis not present

## 2015-08-16 DIAGNOSIS — M25552 Pain in left hip: Secondary | ICD-10-CM | POA: Diagnosis not present

## 2015-08-17 DIAGNOSIS — M25552 Pain in left hip: Secondary | ICD-10-CM | POA: Diagnosis not present

## 2015-08-18 DIAGNOSIS — M25552 Pain in left hip: Secondary | ICD-10-CM | POA: Diagnosis not present

## 2015-08-22 DIAGNOSIS — N83202 Unspecified ovarian cyst, left side: Secondary | ICD-10-CM | POA: Diagnosis not present

## 2015-08-24 DIAGNOSIS — M1612 Unilateral primary osteoarthritis, left hip: Secondary | ICD-10-CM | POA: Diagnosis not present

## 2015-08-30 ENCOUNTER — Telehealth: Payer: Self-pay | Admitting: Cardiology

## 2015-08-30 NOTE — Telephone Encounter (Signed)
New Message  Request for surgical clearance:  1. What type of surgery is being performed? Left hip replacement surgery   2. When is this surgery scheduled? 09/27/2015  3. Are there any medications that need to be held prior to surgery and how long? Not sure   Pt has the appt on 09/14/2015 Would like to know if Dr. Marlou Porch would be able to complete the clearance during the appt. She would also like a call back to discuss if a fax was received from [facility] orthopedic. Please call back

## 2015-08-30 NOTE — Telephone Encounter (Signed)
Pt advised I cannot find request for surgical clearance from ortho MD although Rose in [facility] has office notes from ortho MD for upcoming appt with Dr Marlou Porch 09/14/15. Pt states she will call ortho MD and follow up on request for clearance, she is especially concerned about recommendations for Xarelto prior to surgery.

## 2015-08-31 DIAGNOSIS — D3912 Neoplasm of uncertain behavior of left ovary: Secondary | ICD-10-CM | POA: Diagnosis not present

## 2015-08-31 DIAGNOSIS — N83202 Unspecified ovarian cyst, left side: Secondary | ICD-10-CM | POA: Diagnosis not present

## 2015-09-01 NOTE — Telephone Encounter (Signed)
She will likely be able to hold her Xarelto for 2 days prior to surgery. We will formally evaluate on 09/14/15. Candee Furbish, MD

## 2015-09-02 NOTE — Telephone Encounter (Signed)
Reviewed information with pt.  She is aware Dr Marlou Porch will formally evaluate and give clearance/orders at the time of her upcoming appt 4/19

## 2015-09-14 ENCOUNTER — Ambulatory Visit (INDEPENDENT_AMBULATORY_CARE_PROVIDER_SITE_OTHER): Payer: Commercial Managed Care - HMO | Admitting: Cardiology

## 2015-09-14 ENCOUNTER — Encounter: Payer: Self-pay | Admitting: *Deleted

## 2015-09-14 ENCOUNTER — Encounter: Payer: Self-pay | Admitting: Cardiology

## 2015-09-14 VITALS — BP 116/72 | HR 62 | Ht 65.5 in | Wt 152.4 lb

## 2015-09-14 DIAGNOSIS — Z01810 Encounter for preprocedural cardiovascular examination: Secondary | ICD-10-CM

## 2015-09-14 DIAGNOSIS — M25552 Pain in left hip: Secondary | ICD-10-CM

## 2015-09-14 DIAGNOSIS — Z7901 Long term (current) use of anticoagulants: Secondary | ICD-10-CM

## 2015-09-14 DIAGNOSIS — I480 Paroxysmal atrial fibrillation: Secondary | ICD-10-CM

## 2015-09-14 NOTE — Patient Instructions (Signed)

## 2015-09-14 NOTE — Progress Notes (Signed)
[place]. [address], [address] [place], [place]  [address] Phone: [phone] Fax:  [phone]  Date:  09/14/2015   ID:  Tonya Turner, DOB 05-Oct-1940, MRN [id number]  PCP:  Irven Shelling, MD   History of Present Illness: Tonya Turner is a 75 y.o. female for paroxysmal atrial fibrillation followup. Very rare episode of AFIB. Once she experienced when she was walking with a friend. Has happened when working out at [employer]. She sits down. Drinks water. She is being protected with anticoagulation. She was very diligent about trying to find out the difference in price between oral anticoagulants.  Overall, she feels well. She's not had any stroke, bleeding, chest pain symptoms.  We went over her CHADS-VAS score and it is 2 -female, age greater than 37. Because of this, she deserves anticoagulation. She is taking Xarelto, expensive.  Blood work has been reassuring.  Dr. Latanya Maudlin - needs upcoming hip surgery. Overall doing well, able to complete greater than 4 METS of activity without difficulty. This was prior to surgery. No chest pain, no shortness of breath, no fevers, no chills.   Wt Readings from Last 3 Encounters:  09/14/15 152 lb 6.4 oz (69.128 kg)  03/14/15 156 lb 3.2 oz (70.852 kg)  08/25/14 154 lb (69.854 kg)     Past Medical History  Diagnosis Date  . Dysrhythmia     hx. A.Fib.-tx. Xarelto  . Varicose vein     hx. of-   . Edema of extremities     usually lower extremities- bilateral  . Headache(784.0)     rare occular migraine- none recent  . Cancer ([place])     basal cell skin cancer lesions excised  . Arthritis     hands-mild    Past Surgical History  Procedure Laterality Date  . Varicose vein surgery      left  . Tubal ligation    . Colonoscopy with propofol N/A 10/28/2012    Procedure: COLONOSCOPY WITH PROPOFOL;  Surgeon: Garlan Fair, MD;  Location: [facility];  Service: Endoscopy;  Laterality: N/A;    Current Outpatient Prescriptions    Medication Sig Dispense Refill  . Biotin 5000 MCG CAPS Take 2 capsules by mouth daily as needed.    . calcium-vitamin D (OSCAL WITH D) 500-200 MG-UNIT per tablet Take 1 tablet by mouth daily.    Marland Kitchen conjugated estrogens (PREMARIN) vaginal cream Place 1 Applicatorful vaginally 2 (two) times a week.     . diltiazem (CARDIZEM) 30 MG tablet Take 1 tablet (30 mg total) by mouth as needed (every 6 hours as needed for episode of rapid heartbeat). 120 tablet 6  . diltiazem (CARTIA XT) 180 MG 24 hr capsule Take 1 capsule (180 mg total) by mouth every morning. 90 capsule 2  . glucosamine-chondroitin 500-400 MG tablet Take 0.5 tablets by mouth 1 day or 1 dose.     . Multiple Vitamin (MULTIVITAMIN WITH MINERALS) TABS Take 0.5 tablets by mouth daily.    . traMADol (ULTRAM) 50 MG tablet Take 1 tablet by mouth as directed.    Alveda Reasons 20 MG TABS tablet TAKE 1 TABLET EVERY DAY WITH SUPPER 90 tablet 1   No current facility-administered medications for this visit.    Allergies:    Allergies  Allergen Reactions  . Other Rash    Adhesives-skin irritation  . Iodine Solution [Povidone Iodine] Dermatitis    Topical -causes skin inflammation    Social History:  The patient  reports that she quit smoking about 31 years ago. Her smoking use included Cigarettes. She has a 7.5 pack-year smoking history. She has never used smokeless tobacco. She reports that she drinks alcohol. She reports that she does not use illicit drugs.   ROS:  Please see the history of present illness.  Positive for ocular migraines, irregular heartbeats, rash, contact dermatitis, easy bruising. Denies any bleeding, syncope, orthopnea, PND    PHYSICAL EXAM: VS:  BP 116/72 mmHg  Pulse 62  Ht 5' 5.5" (1.664 m)  Wt 152 lb 6.4 oz (69.128 kg)  BMI 24.97 kg/m2 Well nourished, well developed, in no acute distress HEENT: normal Neck: no JVD Cardiac:  normal S1, S2; RRR; no murmur Lungs:  clear to auscultation bilaterally, no wheezing,  rhonchi or rales Abd: soft, nontender, no hepatomegaly Ext: no edema2+ pulses Skin: warm and dry Neuro: no focal abnormalities noted  EKG:  Today 03/14/15-sinus rhythm, 67, left atrial enlargement, personally viewed-prior 02/24/14-sinus rhythm, 76, no other abnormalities  ECHO: 1. Normal LV size and function.2. There were no regional wall motion abnormalities.3. Left ventricular ejection fraction estimated by 2D at 60-65 percent. 4. Trace mitral valve regurgitation.5. Trivial tricuspid regurgitation. Labwork reviewed, stable kidney function and hemoglobin  ASSESSMENT AND PLAN:  Preoperative risk stratification -Upcoming total hip , she states that this maybe secondary to autoimmune phenomenon. She may proceed with low risk from a cardiac perspective for surgery. She is able to complete greater than 4 METS of activity without difficulty. Previous ejection fraction normal. -Okay to hold Xarelto 2 days prior to surgery.   Atrial fibrillation  - paroxysmal. Doing very well. No changes. She's had a few brief episodes usually 5 minutes duration. Takes an extra diltiazem when necessary. Continue with current medication. We have added short-acting diltiazem 30 mg by mouth every 6 hours to be taken on as-needed basis if she does have an episode of atrial fibrillation. No obvious triggers. Interestingly, she does describe an Allred-like phenomenon prior to her atrial fibrillation episodes, feeling of darkening of her vision, mild dizziness.  Chronic anticoagulation -no changes made to medications. Check lab work every 6 months. Lab work reviewed and reassuring. Creatinine 0.6, hemoglobin 13.5. Labs reviewed. Previously applied for long-term care insurance  Discussed her medications.  1. Six-month follow-up  Signed, Candee Furbish, MD [facility]  09/14/2015 2:46 PM

## 2015-09-15 ENCOUNTER — Telehealth: Payer: Self-pay | Admitting: Cardiology

## 2015-09-15 ENCOUNTER — Other Ambulatory Visit: Payer: Self-pay | Admitting: Orthopaedic Surgery

## 2015-09-15 NOTE — Telephone Encounter (Signed)
PT CALLED WANTING  LAB   AND  INSISTED  ON ASKING WHY LABS  WERE NOT  DONE AFTER  VISIT  YESTERDAY  INFORMED  WOULD BE  GLAD  TO ORDER  BUT  STILL  WANTED  MD  TO KNOW .WILL FORWARD TO DR  Marlou Porch FOR  REVIEW ON WHAT LABS  TO HAVE DONE./CY

## 2015-09-15 NOTE — Telephone Encounter (Signed)
Follow Up  Pt called states that she has already had a lab appt and didn't notice that she hadn't had a lab appt. Req a call back to discuss if the labs are needed. Please call

## 2015-09-16 NOTE — Telephone Encounter (Signed)
Pt is aware Dr Marlou Porch did not order lab work at this time and what she would need has been ordered by Dr Rhona Raider pre surgical.  Pt states understanding.  She had no further questions.

## 2015-09-20 NOTE — Pre-Procedure Instructions (Signed)
    TEONDRA LANGIN  09/20/2015      [facility] - [place], [place] - [address] [place] [address] Phone: [phone] Fax: [place], [place] [address] Phone: [phone] Fax: [phone]    Your procedure is scheduled on Tuesday, May 2nd, 2017.  Report to [facility] Admitting at 8:15 A.M.   Call this number if you have problems the morning of surgery:  [phone]   Remember:  Do not eat food or drink liquids after midnight.   Take these medicines the morning of surgery with A SIP OF WATER: Diltiazem (Cardizem) if needed, Diltiazem (Cartia XT), Loratadine (Claritin) if needed, Tramadol (Ultram) if needed.  Per MD's instructions, stop Xarelto 2 days prior to surgery.  Stop taking: Aspirin, NSAIDS, Aleve, Naproxen, Ibuprofen, Advil, Motrin, BC's, Goody's, Fish oil, all herbal medications, and all vitamins.    Do not wear jewelry, make-up or nail polish.  Do not wear lotions, powders, or perfumes.    Do not shave 48 hours prior to surgery.    Do not bring valuables to the hospital.   [facility] is not responsible for any belongings or valuables.  Contacts, dentures or bridgework may not be worn into surgery.  Leave your suitcase in the car.  After surgery it may be brought to your room.  For patients admitted to the hospital, discharge time will be determined by your treatment team.  Patients discharged the day of surgery will not be allowed to drive home.   Special instructions:  See attached.   Please read over the following fact sheets that you were given. Pain Booklet, Coughing and Deep Breathing, Blood Transfusion Information, Total Joint Packet, MRSA Information and Surgical Site Infection Prevention

## 2015-09-21 ENCOUNTER — Encounter (HOSPITAL_COMMUNITY)
Admission: RE | Admit: 2015-09-21 | Discharge: 2015-09-21 | Disposition: A | Discharge status: Home / Self Care | Payer: Commercial Managed Care - HMO | Source: Ambulatory Visit | Attending: Orthopaedic Surgery | Admitting: Orthopaedic Surgery

## 2015-09-21 ENCOUNTER — Encounter (HOSPITAL_COMMUNITY): Payer: Self-pay

## 2015-09-21 DIAGNOSIS — Z7901 Long term (current) use of anticoagulants: Secondary | ICD-10-CM | POA: Diagnosis not present

## 2015-09-21 DIAGNOSIS — Z01818 Encounter for other preprocedural examination: Secondary | ICD-10-CM | POA: Insufficient documentation

## 2015-09-21 DIAGNOSIS — Z87891 Personal history of nicotine dependence: Secondary | ICD-10-CM | POA: Diagnosis not present

## 2015-09-21 DIAGNOSIS — Z79899 Other long term (current) drug therapy: Secondary | ICD-10-CM | POA: Diagnosis not present

## 2015-09-21 DIAGNOSIS — Z0183 Encounter for blood typing: Secondary | ICD-10-CM | POA: Diagnosis not present

## 2015-09-21 DIAGNOSIS — M1612 Unilateral primary osteoarthritis, left hip: Secondary | ICD-10-CM | POA: Diagnosis not present

## 2015-09-21 DIAGNOSIS — I4891 Unspecified atrial fibrillation: Secondary | ICD-10-CM | POA: Insufficient documentation

## 2015-09-21 DIAGNOSIS — Z01812 Encounter for preprocedural laboratory examination: Secondary | ICD-10-CM | POA: Diagnosis not present

## 2015-09-21 DIAGNOSIS — H00014 Hordeolum externum left upper eyelid: Secondary | ICD-10-CM | POA: Diagnosis not present

## 2015-09-21 HISTORY — DX: Dermatitis, unspecified: L30.9

## 2015-09-21 HISTORY — DX: Family history of other specified conditions: Z84.89

## 2015-09-21 LAB — URINALYSIS, ROUTINE W REFLEX MICROSCOPIC
BILIRUBIN URINE: NEGATIVE
Glucose, UA: NEGATIVE mg/dL
Hgb urine dipstick: NEGATIVE
KETONES UR: NEGATIVE mg/dL
Leukocytes, UA: NEGATIVE
NITRITE: NEGATIVE
PROTEIN: NEGATIVE mg/dL
Specific Gravity, Urine: 1.01 (ref 1.005–1.030)
pH: 7 (ref 5.0–8.0)

## 2015-09-21 LAB — CBC WITH DIFFERENTIAL/PLATELET
BASOS PCT: 1 %
Basophils Absolute: 0.1 10*3/uL (ref 0.0–0.1)
EOS ABS: 0.1 10*3/uL (ref 0.0–0.7)
Eosinophils Relative: 1 %
HEMATOCRIT: 43.8 % (ref 36.0–46.0)
HEMOGLOBIN: 14 g/dL (ref 12.0–15.0)
Lymphocytes Relative: 27 %
Lymphs Abs: 2 10*3/uL (ref 0.7–4.0)
MCH: 30.6 pg (ref 26.0–34.0)
MCHC: 32 g/dL (ref 30.0–36.0)
MCV: 95.6 fL (ref 78.0–100.0)
MONO ABS: 0.4 10*3/uL (ref 0.1–1.0)
MONOS PCT: 6 %
NEUTROS ABS: 4.8 10*3/uL (ref 1.7–7.7)
Neutrophils Relative %: 65 %
PLATELETS: 357 10*3/uL (ref 150–400)
RBC: 4.58 MIL/uL (ref 3.87–5.11)
RDW: 13.4 % (ref 11.5–15.5)
WBC: 7.2 10*3/uL (ref 4.0–10.5)

## 2015-09-21 LAB — BASIC METABOLIC PANEL
Anion gap: 12 (ref 5–15)
BUN: 11 mg/dL (ref 4–21)
BUN: 11 mg/dL (ref 6–20)
CALCIUM: 9.4 mg/dL (ref 8.9–10.3)
CO2: 26 mmol/L (ref 22–32)
CREATININE: 0.7 mg/dL (ref 0.5–1.1)
CREATININE: 0.7 mg/dL (ref 0.44–1.00)
Chloride: 102 mmol/L (ref 101–111)
GFR calc Af Amer: >60 mL/min (ref >60)
GFR calc non Af Amer: >60 mL/min (ref >60)
GLUCOSE: 89 mg/dL (ref 65–99)
Glucose: 89 mg/dL
Potassium: 4 mmol/L (ref 3.5–5.1)
SODIUM: 140 mmol/L (ref 137–147)
Sodium: 140 mmol/L (ref 135–145)

## 2015-09-21 LAB — CBC AND DIFFERENTIAL: WBC: 7.2 10*3/mL

## 2015-09-21 LAB — SURGICAL PCR SCREEN
MRSA, PCR: NEGATIVE
STAPHYLOCOCCUS AUREUS: NEGATIVE

## 2015-09-21 LAB — TYPE AND SCREEN
ABO/RH(D): A POS
ANTIBODY SCREEN: NEGATIVE

## 2015-09-21 LAB — ABO/RH: ABO/RH(D): A POS

## 2015-09-21 LAB — PROTIME-INR
INR: 1.04 (ref 0.00–1.49)
Prothrombin Time: 13.8 seconds (ref 11.6–15.2)

## 2015-09-21 LAB — APTT: aPTT: 30 seconds (ref 24–37)

## 2015-09-21 NOTE — Progress Notes (Signed)
PCP - Dr. Lavone Orn Cardiologist - Dr. Marlou Porch  EKG - 03/14/15 CXR - denies  Echo/stress test/Cardiac Cath - denies  Patient denies chest pain and shortness of breath at PAT appointment.  Per MD's instructions, patient will take her last dose of Xarelto on Saturday, April 29th.  Cardiac clearance note in chart - 09/14/15

## 2015-09-22 ENCOUNTER — Telehealth: Payer: Self-pay | Admitting: Cardiology

## 2015-09-22 NOTE — Telephone Encounter (Addendum)
Tonya Turner works with the Anesthesiologist at [facility] and wants to know based on the type of anesthesia she will need for her surgery, that would require her holding her Xarelto for 3 days instead of 2 and she wanted to know if this is something Dr. Marlou Porch approves of. Please f/u with her by tomorrow.   This can be signed off on in [place].

## 2015-09-22 NOTE — Progress Notes (Addendum)
Anesthesia Chart Review:  Pt is a 75 year old female scheduled for L total hip arthroplasty anterior approach on 09/27/2015 with Dr. Rhona Raider.   Cardiologist is Dr. Candee Furbish who has cleared pt for surgery.   PMH includes:  Atrial fibrillation. Former smoker. BMI 25  Medications include: diltiazem, xarelto. Last dose of xarelto will be 09/23/15  Preoperative labs reviewed.    EKG 03/14/15: NSR. Possible LAE.   Initially Dr. Marlou Porch gave permission for pt to stop xarelto 2 days prior to surgery. I reached out to Dr. Marlou Porch and obtained permission for pt to stop it 3 days prior to surgery. I notified pt of the change by telephone.   If no changes, I anticipate pt can proceed with surgery as scheduled.   Willeen Cass, FNP-BC [facility]/Anesthesiology Phone: [phone] 09/23/2015 9:17 AM

## 2015-09-23 NOTE — Telephone Encounter (Signed)
OK to hold Xarelto 3 days prior to surgery.  Candee Furbish, MD

## 2015-09-23 NOTE — Telephone Encounter (Signed)
Routed to Willeen Cass, NP for her knowledge.

## 2015-09-26 NOTE — H&P (Signed)
TOTAL HIP ADMISSION H&P  Patient is admitted for left total hip arthroplasty.  Subjective:  Chief Complaint: left hip pain  HPI: Tonya Turner, 75 y.o. female, has a history of pain and functional disability in the left hip(s) due to arthritis and patient has failed non-surgical conservative treatments for greater than 12 weeks to include NSAID's and/or analgesics, corticosteriod injections, flexibility and strengthening excercises, use of assistive devices, weight reduction as appropriate and activity modification.  Onset of symptoms was gradual starting 5 years ago with gradually worsening course since that time.The patient noted no past surgery on the left hip(s).  Patient currently rates pain in the left hip at 10 out of 10 with activity. Patient has night pain, worsening of pain with activity and weight bearing, trendelenberg gait, pain that interfers with activities of daily living and crepitus. Patient has evidence of subchondral cysts, subchondral sclerosis, periarticular osteophytes and joint space narrowing by imaging studies. This condition presents safety issues increasing the risk of falls. There is no current active infection.  Patient Active Problem List   Diagnosis Date Noted  . Medication management 08/25/2014  . PAF (paroxysmal atrial fibrillation) ([facility]) 08/19/2013  . Chronic anticoagulation 08/19/2013  . Dysrhythmia   . Varicose vein   . Edema of extremities   . Cancer ([facility])   . Arthritis    Past Medical History  Diagnosis Date  . Dysrhythmia     hx. A.Fib.-tx. Xarelto  . Varicose vein     hx. of-   . Edema of extremities     usually lower extremities- bilateral  . Cancer ([facility])     basal cell skin cancer lesions excised  . Arthritis     hands-mild  . Family history of adverse reaction to anesthesia     "brother is allergic and gets sick"  . Headache(784.0)     rare occular migraine- none recent  . Eczema     history of  . History of pneumonia as a child   .  History of bronchitis     Past Surgical History  Procedure Laterality Date  . Varicose vein surgery      left  . Tubal ligation    . Colonoscopy with propofol N/A 10/28/2012    Procedure: COLONOSCOPY WITH PROPOFOL;  Surgeon: Garlan Fair, MD;  Location: [facility];  Service: Endoscopy;  Laterality: N/A;    No prescriptions prior to admission   Allergies  Allergen Reactions  . Other Rash    Adhesives-skin irritation  . Iodine Solution [Povidone Iodine] Dermatitis    Topical -causes skin inflammation    Social History  Substance Use Topics  . Smoking status: Former Smoker -- 0.25 packs/day for 30 years    Types: Cigarettes    Quit date: 10/01/1983  . Smokeless tobacco: Never Used  . Alcohol Use: Yes     Comment: 2 glasses daily    No family history on file.   Review of Systems  Musculoskeletal: Positive for joint pain.       Left hip  All other systems reviewed and are negative.   Objective:  Physical Exam  Constitutional: She is oriented to person, place, and time. She appears well-developed and well-nourished.  HENT:  Head: Normocephalic and atraumatic.  Eyes: Pupils are equal, round, and reactive to light.  Neck: Normal range of motion.  Cardiovascular: Normal rate and regular rhythm.   Respiratory: Effort normal.  GI: Soft.  Musculoskeletal:  Left hip motion is little bit limited.  She  has extreme pain to internal rotation.  She walks with a mildly altered gait.  Lumbosacral motion is full and straight leg raise is negative.  Sensation and motor function are intact in her feet with palpable pulses on both sides.  Opposite hip moves fully and is painless.    Neurological: She is alert and oriented to person, place, and time.  Skin: Skin is warm and dry.  Psychiatric: She has a normal mood and affect. Her behavior is normal. Judgment and thought content normal.    Vital signs in last 24 hours:    Labs:   Estimated body mass index is 24.97 kg/(m^2) as  calculated from the following:   Height as of 09/14/15: 5' 5.5" (1.664 m).   Weight as of 09/14/15: 69.128 kg (152 lb 6.4 oz).   Imaging Review Plain radiographs demonstrate severe degenerative joint disease of the left hip(s). The bone quality appears to be good for age and reported activity level.  Assessment/Plan:  End stage primary arthritis, left hip(s)  The patient history, physical examination, clinical judgement of the provider and imaging studies are consistent with end stage degenerative joint disease of the left hip(s) and total hip arthroplasty is deemed medically necessary. The treatment options including medical management, injection therapy, arthroscopy and arthroplasty were discussed at length. The risks and benefits of total hip arthroplasty were presented and reviewed. The risks due to aseptic loosening, infection, stiffness, dislocation/subluxation,  thromboembolic complications and other imponderables were discussed.  The patient acknowledged the explanation, agreed to proceed with the plan and consent was signed. Patient is being admitted for inpatient treatment for surgery, pain control, PT, OT, prophylactic antibiotics, VTE prophylaxis, progressive ambulation and ADL's and discharge planning.The patient is planning to be discharged home with home health services

## 2015-09-27 ENCOUNTER — Inpatient Hospital Stay (HOSPITAL_COMMUNITY): Payer: Commercial Managed Care - HMO | Admitting: Emergency Medicine

## 2015-09-27 ENCOUNTER — Inpatient Hospital Stay (HOSPITAL_COMMUNITY): Payer: Commercial Managed Care - HMO

## 2015-09-27 ENCOUNTER — Inpatient Hospital Stay (HOSPITAL_COMMUNITY)
Admission: RE | Admit: 2015-09-27 | Discharge: 2015-09-29 | DRG: 470 | Disposition: A | Discharge status: Skilled Nursing Facility | Payer: Commercial Managed Care - HMO | Source: Ambulatory Visit | Attending: Orthopaedic Surgery | Admitting: Orthopaedic Surgery

## 2015-09-27 ENCOUNTER — Encounter (HOSPITAL_COMMUNITY)
Admission: RE | Disposition: A | Discharge status: Skilled Nursing Facility | Payer: Self-pay | Source: Ambulatory Visit | Attending: Orthopaedic Surgery

## 2015-09-27 ENCOUNTER — Encounter (HOSPITAL_COMMUNITY): Payer: Self-pay | Admitting: Anesthesiology

## 2015-09-27 ENCOUNTER — Inpatient Hospital Stay (HOSPITAL_COMMUNITY): Payer: Commercial Managed Care - HMO | Admitting: Anesthesiology

## 2015-09-27 DIAGNOSIS — M1612 Unilateral primary osteoarthritis, left hip: Secondary | ICD-10-CM | POA: Diagnosis not present

## 2015-09-27 DIAGNOSIS — Z91048 Other nonmedicinal substance allergy status: Secondary | ICD-10-CM

## 2015-09-27 DIAGNOSIS — M6281 Muscle weakness (generalized): Secondary | ICD-10-CM | POA: Diagnosis not present

## 2015-09-27 DIAGNOSIS — I480 Paroxysmal atrial fibrillation: Secondary | ICD-10-CM | POA: Diagnosis not present

## 2015-09-27 DIAGNOSIS — M25559 Pain in unspecified hip: Secondary | ICD-10-CM | POA: Diagnosis not present

## 2015-09-27 DIAGNOSIS — M5136 Other intervertebral disc degeneration, lumbar region: Secondary | ICD-10-CM | POA: Diagnosis present

## 2015-09-27 DIAGNOSIS — Z96642 Presence of left artificial hip joint: Secondary | ICD-10-CM | POA: Diagnosis not present

## 2015-09-27 DIAGNOSIS — Z471 Aftercare following joint replacement surgery: Secondary | ICD-10-CM | POA: Diagnosis not present

## 2015-09-27 DIAGNOSIS — Z87891 Personal history of nicotine dependence: Secondary | ICD-10-CM

## 2015-09-27 DIAGNOSIS — Z419 Encounter for procedure for purposes other than remedying health state, unspecified: Secondary | ICD-10-CM

## 2015-09-27 DIAGNOSIS — M169 Osteoarthritis of hip, unspecified: Secondary | ICD-10-CM | POA: Diagnosis not present

## 2015-09-27 DIAGNOSIS — Z888 Allergy status to other drugs, medicaments and biological substances status: Secondary | ICD-10-CM

## 2015-09-27 DIAGNOSIS — R262 Difficulty in walking, not elsewhere classified: Secondary | ICD-10-CM | POA: Diagnosis not present

## 2015-09-27 HISTORY — PX: TOTAL HIP ARTHROPLASTY: SHX124

## 2015-09-27 HISTORY — DX: Other intervertebral disc degeneration, lumbar region without mention of lumbar back pain or lower extremity pain: M51.369

## 2015-09-27 HISTORY — DX: Other intervertebral disc degeneration, lumbar region: M51.36

## 2015-09-27 HISTORY — DX: Pneumonia, unspecified organism: J18.9

## 2015-09-27 HISTORY — DX: Basal cell carcinoma of skin of unspecified lower limb, including hip: C44.711

## 2015-09-27 HISTORY — DX: Other intervertebral disc degeneration, thoracic region: M51.34

## 2015-09-27 HISTORY — DX: Migraine, unspecified, not intractable, without status migrainosus: G43.909

## 2015-09-27 SURGERY — ARTHROPLASTY, HIP, TOTAL, ANTERIOR APPROACH
Anesthesia: General | Site: Hip | Laterality: Left

## 2015-09-27 MED ORDER — FENTANYL CITRATE (PF) 100 MCG/2ML IJ SOLN
INTRAMUSCULAR | Status: DC | PRN
Start: 2015-09-27 — End: 2015-09-27
  Administered 2015-09-27 (×3): 50 ug via INTRAVENOUS

## 2015-09-27 MED ORDER — DILTIAZEM HCL 30 MG PO TABS: 30.0000 mg | ORAL_TABLET | ORAL | Status: DC | PRN

## 2015-09-27 MED ORDER — EPHEDRINE 5 MG/ML INJ
INTRAVENOUS | Status: AC
  Filled 2015-09-27: qty 10

## 2015-09-27 MED ORDER — ROCURONIUM BROMIDE 50 MG/5ML IV SOLN
INTRAVENOUS | Status: AC
  Filled 2015-09-27: qty 1

## 2015-09-27 MED ORDER — DILTIAZEM HCL ER COATED BEADS 180 MG PO CP24
180.0000 mg | ORAL_CAPSULE | Freq: Every morning | ORAL | Status: DC
  Administered 2015-09-28 – 2015-09-29 (×2): 180 mg via ORAL
  Filled 2015-09-27 (×2): qty 1

## 2015-09-27 MED ORDER — MIDAZOLAM HCL 5 MG/5ML IJ SOLN
INTRAMUSCULAR | Status: DC | PRN
  Administered 2015-09-27 (×2): 1 mg via INTRAVENOUS

## 2015-09-27 MED ORDER — HYDROCODONE-ACETAMINOPHEN 5-325 MG PO TABS
1.0000 | ORAL_TABLET | ORAL | Status: DC | PRN
  Administered 2015-09-27 (×2): 2 via ORAL
  Administered 2015-09-28 (×2): 1 via ORAL
  Administered 2015-09-28 (×2): 2 via ORAL
  Administered 2015-09-29 (×2): 1 via ORAL
  Filled 2015-09-27 (×4): qty 2
  Filled 2015-09-27 (×3): qty 1

## 2015-09-27 MED ORDER — METHOCARBAMOL 500 MG PO TABS
500.0000 mg | ORAL_TABLET | Freq: Four times a day (QID) | ORAL | Status: DC | PRN
  Administered 2015-09-27 – 2015-09-29 (×5): 500 mg via ORAL
  Filled 2015-09-27 (×6): qty 1

## 2015-09-27 MED ORDER — CEFAZOLIN SODIUM-DEXTROSE 2-4 GM/100ML-% IV SOLN
INTRAVENOUS | Status: AC
  Administered 2015-09-27: 2 g via INTRAVENOUS
  Filled 2015-09-27: qty 100

## 2015-09-27 MED ORDER — BUPIVACAINE-EPINEPHRINE (PF) 0.5% -1:200000 IJ SOLN
INTRAMUSCULAR | Status: AC
  Filled 2015-09-27: qty 30

## 2015-09-27 MED ORDER — MIDAZOLAM HCL 2 MG/2ML IJ SOLN
INTRAMUSCULAR | Status: AC
  Filled 2015-09-27: qty 2

## 2015-09-27 MED ORDER — LIDOCAINE HCL (CARDIAC) 20 MG/ML IV SOLN
INTRAVENOUS | Status: DC | PRN
  Administered 2015-09-27: 100 mg via INTRAVENOUS

## 2015-09-27 MED ORDER — SUGAMMADEX SODIUM 200 MG/2ML IV SOLN
INTRAVENOUS | Status: DC | PRN
  Administered 2015-09-27: 150 mg via INTRAVENOUS

## 2015-09-27 MED ORDER — LACTATED RINGERS IV SOLN
INTRAVENOUS | Status: DC
  Administered 2015-09-27 – 2015-09-28 (×2): via INTRAVENOUS

## 2015-09-27 MED ORDER — BISACODYL 5 MG PO TBEC: 5.0000 mg | DELAYED_RELEASE_TABLET | Freq: Every day | ORAL | Status: DC | PRN

## 2015-09-27 MED ORDER — HYDROCODONE-ACETAMINOPHEN 5-325 MG PO TABS
ORAL_TABLET | ORAL | Status: AC
  Filled 2015-09-27: qty 2

## 2015-09-27 MED ORDER — ALBUMIN HUMAN 5 % IV SOLN
INTRAVENOUS | Status: DC | PRN
  Administered 2015-09-27: 12:00:00 via INTRAVENOUS

## 2015-09-27 MED ORDER — METHOCARBAMOL 1000 MG/10ML IJ SOLN
500.0000 mg | Freq: Four times a day (QID) | INTRAVENOUS | Status: DC | PRN
  Filled 2015-09-27: qty 5

## 2015-09-27 MED ORDER — CHLORHEXIDINE GLUCONATE 4 % EX LIQD
60.0000 mL | Freq: Once | CUTANEOUS | Status: DC
Start: 2015-09-27 — End: 2015-09-27

## 2015-09-27 MED ORDER — HYDROMORPHONE HCL 1 MG/ML IJ SOLN
INTRAMUSCULAR | Status: AC
  Administered 2015-09-27: 1 mg via INTRAVENOUS
  Filled 2015-09-27: qty 1

## 2015-09-27 MED ORDER — METOCLOPRAMIDE HCL 5 MG PO TABS: 5.0000 mg | ORAL_TABLET | Freq: Three times a day (TID) | ORAL | Status: DC | PRN

## 2015-09-27 MED ORDER — HYDROMORPHONE HCL 1 MG/ML IJ SOLN
INTRAMUSCULAR | Status: AC
  Administered 2015-09-27: 0.5 mg via INTRAVENOUS
  Filled 2015-09-27: qty 1

## 2015-09-27 MED ORDER — LIDOCAINE 2% (20 MG/ML) 5 ML SYRINGE
INTRAMUSCULAR | Status: AC
  Filled 2015-09-27: qty 5

## 2015-09-27 MED ORDER — CEFAZOLIN SODIUM-DEXTROSE 2-4 GM/100ML-% IV SOLN
2.0000 g | Freq: Four times a day (QID) | INTRAVENOUS | Status: AC
  Administered 2015-09-27 – 2015-09-28 (×2): 2 g via INTRAVENOUS
  Filled 2015-09-27 (×2): qty 100

## 2015-09-27 MED ORDER — ONDANSETRON HCL 4 MG/2ML IJ SOLN
INTRAMUSCULAR | Status: AC
  Filled 2015-09-27: qty 2

## 2015-09-27 MED ORDER — DIPHENHYDRAMINE HCL 12.5 MG/5ML PO ELIX: 12.5000 mg | ORAL_SOLUTION | ORAL | Status: DC | PRN

## 2015-09-27 MED ORDER — HYDROMORPHONE HCL 1 MG/ML IJ SOLN
0.5000 mg | INTRAMUSCULAR | Status: AC | PRN
  Administered 2015-09-27 (×4): 0.5 mg via INTRAVENOUS

## 2015-09-27 MED ORDER — RIVAROXABAN 20 MG PO TABS
20.0000 mg | ORAL_TABLET | Freq: Every day | ORAL | Status: DC
  Administered 2015-09-28: 20 mg via ORAL
  Filled 2015-09-27: qty 1

## 2015-09-27 MED ORDER — METOCLOPRAMIDE HCL 5 MG/ML IJ SOLN: 5.0000 mg | Freq: Three times a day (TID) | INTRAMUSCULAR | Status: DC | PRN

## 2015-09-27 MED ORDER — BUPIVACAINE LIPOSOME 1.3 % IJ SUSP
20.0000 mL | INTRAMUSCULAR | Status: DC
  Filled 2015-09-27: qty 20

## 2015-09-27 MED ORDER — MENTHOL 3 MG MT LOZG: 1.0000 | LOZENGE | OROMUCOSAL | Status: DC | PRN

## 2015-09-27 MED ORDER — ARTIFICIAL TEARS OP OINT
TOPICAL_OINTMENT | OPHTHALMIC | Status: DC | PRN
  Administered 2015-09-27: 1 via OPHTHALMIC

## 2015-09-27 MED ORDER — PROPOFOL 10 MG/ML IV BOLUS
INTRAVENOUS | Status: AC
  Filled 2015-09-27: qty 20

## 2015-09-27 MED ORDER — LORATADINE 10 MG PO TABS: 10.0000 mg | ORAL_TABLET | Freq: Every day | ORAL | Status: DC | PRN

## 2015-09-27 MED ORDER — 0.9 % SODIUM CHLORIDE (POUR BTL) OPTIME
TOPICAL | Status: DC | PRN
  Administered 2015-09-27: 1000 mL

## 2015-09-27 MED ORDER — ONDANSETRON HCL 4 MG/2ML IJ SOLN
INTRAMUSCULAR | Status: DC | PRN
  Administered 2015-09-27: 4 mg via INTRAVENOUS

## 2015-09-27 MED ORDER — HYDROMORPHONE HCL 1 MG/ML IJ SOLN
0.5000 mg | INTRAMUSCULAR | Status: DC | PRN
  Administered 2015-09-27: 1 mg via INTRAVENOUS

## 2015-09-27 MED ORDER — DOCUSATE SODIUM 100 MG PO CAPS
100.0000 mg | ORAL_CAPSULE | Freq: Two times a day (BID) | ORAL | Status: DC
  Administered 2015-09-27 – 2015-09-29 (×4): 100 mg via ORAL
  Filled 2015-09-27 (×4): qty 1

## 2015-09-27 MED ORDER — ACETAMINOPHEN 650 MG RE SUPP: 650.0000 mg | Freq: Four times a day (QID) | RECTAL | Status: DC | PRN

## 2015-09-27 MED ORDER — EPHEDRINE SULFATE 50 MG/ML IJ SOLN
INTRAMUSCULAR | Status: DC | PRN
  Administered 2015-09-27: 10 mg via INTRAVENOUS

## 2015-09-27 MED ORDER — PROPOFOL 10 MG/ML IV BOLUS
INTRAVENOUS | Status: DC | PRN
  Administered 2015-09-27: 140 mg via INTRAVENOUS

## 2015-09-27 MED ORDER — PHENOL 1.4 % MT LIQD: 1.0000 | OROMUCOSAL | Status: DC | PRN

## 2015-09-27 MED ORDER — WHITE PETROLATUM GEL
Status: AC
  Administered 2015-09-27: 21:00:00
  Filled 2015-09-27: qty 1

## 2015-09-27 MED ORDER — ONDANSETRON HCL 4 MG/2ML IJ SOLN: 4.0000 mg | Freq: Four times a day (QID) | INTRAMUSCULAR | Status: DC | PRN

## 2015-09-27 MED ORDER — ARTIFICIAL TEARS OP OINT
TOPICAL_OINTMENT | OPHTHALMIC | Status: AC
  Filled 2015-09-27: qty 3.5

## 2015-09-27 MED ORDER — FENTANYL CITRATE (PF) 250 MCG/5ML IJ SOLN
INTRAMUSCULAR | Status: AC
  Filled 2015-09-27: qty 5

## 2015-09-27 MED ORDER — ACETAMINOPHEN 10 MG/ML IV SOLN
INTRAVENOUS | Status: DC | PRN
  Administered 2015-09-27: 1000 mg via INTRAVENOUS

## 2015-09-27 MED ORDER — ALUM & MAG HYDROXIDE-SIMETH 200-200-20 MG/5ML PO SUSP: 30.0000 mL | ORAL | Status: DC | PRN

## 2015-09-27 MED ORDER — CEFAZOLIN SODIUM-DEXTROSE 2-4 GM/100ML-% IV SOLN: 2.0000 g | INTRAVENOUS | Status: DC

## 2015-09-27 MED ORDER — BUPIVACAINE LIPOSOME 1.3 % IJ SUSP
INTRAMUSCULAR | Status: DC | PRN
  Administered 2015-09-27: 40 mL

## 2015-09-27 MED ORDER — LACTATED RINGERS IV SOLN: INTRAVENOUS | Status: DC

## 2015-09-27 MED ORDER — ACETAMINOPHEN 325 MG PO TABS: 650.0000 mg | ORAL_TABLET | Freq: Four times a day (QID) | ORAL | Status: DC | PRN

## 2015-09-27 MED ORDER — TRICLOSAN 0.3 % EX BAR: 1.0000 "application " | CHEWABLE_BAR | Freq: Two times a day (BID) | CUTANEOUS | Status: DC

## 2015-09-27 MED ORDER — LACTATED RINGERS IV SOLN
INTRAVENOUS | Status: DC
  Administered 2015-09-27 (×3): via INTRAVENOUS

## 2015-09-27 MED ORDER — ROCURONIUM BROMIDE 100 MG/10ML IV SOLN
INTRAVENOUS | Status: DC | PRN
  Administered 2015-09-27: 10 mg via INTRAVENOUS
  Administered 2015-09-27: 40 mg via INTRAVENOUS

## 2015-09-27 MED ORDER — ONDANSETRON HCL 4 MG PO TABS: 4.0000 mg | ORAL_TABLET | Freq: Four times a day (QID) | ORAL | Status: DC | PRN

## 2015-09-27 MED ORDER — SUGAMMADEX SODIUM 200 MG/2ML IV SOLN
INTRAVENOUS | Status: AC
  Filled 2015-09-27: qty 2

## 2015-09-27 MED ORDER — ACETAMINOPHEN 10 MG/ML IV SOLN
INTRAVENOUS | Status: AC
  Filled 2015-09-27: qty 100

## 2015-09-27 MED ORDER — METHOCARBAMOL 500 MG PO TABS
ORAL_TABLET | ORAL | Status: AC
  Filled 2015-09-27: qty 1

## 2015-09-27 SURGICAL SUPPLY — 53 items
BLADE SAW SGTL 18X1.27X75 (BLADE) ×2 IMPLANT
CAPT HIP TOTAL 2 ×1 IMPLANT
CELLS DAT CNTRL 66122 CELL SVR (MISCELLANEOUS) ×1 IMPLANT
CLSR STERI-STRIP ANTIMIC 1/2X4 (GAUZE/BANDAGES/DRESSINGS) ×1 IMPLANT
COVER PERINEAL POST (MISCELLANEOUS) ×2 IMPLANT
COVER SURGICAL LIGHT HANDLE (MISCELLANEOUS) ×2 IMPLANT
DRAPE C-ARM 42X72 X-RAY (DRAPES) ×2 IMPLANT
DRAPE IMP U-DRAPE 54X76 (DRAPES) ×2 IMPLANT
DRAPE INCISE IOBAN 66X45 STRL (DRAPES) ×1 IMPLANT
DRAPE STERI IOBAN 125X83 (DRAPES) ×2 IMPLANT
DRAPE U-SHAPE 47X51 STRL (DRAPES) ×6 IMPLANT
DRSG AQUACEL AG ADV 3.5X10 (GAUZE/BANDAGES/DRESSINGS) ×2 IMPLANT
DURAPREP 26ML APPLICATOR (WOUND CARE) ×2 IMPLANT
ELECT BLADE 4.0 EZ CLEAN MEGAD (MISCELLANEOUS) ×2
ELECT CAUTERY BLADE 6.4 (BLADE) ×2 IMPLANT
ELECT REM PT RETURN 9FT ADLT (ELECTROSURGICAL) ×2
ELECTRODE BLDE 4.0 EZ CLN MEGD (MISCELLANEOUS) IMPLANT
ELECTRODE REM PT RTRN 9FT ADLT (ELECTROSURGICAL) ×1 IMPLANT
FACESHIELD WRAPAROUND (MASK) ×10 IMPLANT
FACESHIELD WRAPAROUND OR TEAM (MASK) ×2 IMPLANT
GLOVE BIO SURGEON STRL SZ 6.5 (GLOVE) ×1 IMPLANT
GLOVE BIO SURGEON STRL SZ8 (GLOVE) ×5 IMPLANT
GLOVE BIOGEL PI IND STRL 8 (GLOVE) ×2 IMPLANT
GLOVE BIOGEL PI INDICATOR 8 (GLOVE) ×3
GLOVE ECLIPSE 7.5 STRL STRAW (GLOVE) ×1 IMPLANT
GOWN STRL REUS W/ TWL LRG LVL3 (GOWN DISPOSABLE) ×1 IMPLANT
GOWN STRL REUS W/ TWL XL LVL3 (GOWN DISPOSABLE) ×2 IMPLANT
GOWN STRL REUS W/TWL LRG LVL3 (GOWN DISPOSABLE) ×2
GOWN STRL REUS W/TWL XL LVL3 (GOWN DISPOSABLE) ×4
KIT BASIN OR (CUSTOM PROCEDURE TRAY) ×2 IMPLANT
KIT ROOM TURNOVER OR (KITS) ×2 IMPLANT
MANIFOLD NEPTUNE II (INSTRUMENTS) ×2 IMPLANT
NEEDLE 22X1 1/2 (OR ONLY) (NEEDLE) ×1 IMPLANT
NEEDLE HYPO 22GX1.5 SAFETY (NEEDLE) ×2 IMPLANT
NS IRRIG 1000ML POUR BTL (IV SOLUTION) ×2 IMPLANT
PACK TOTAL JOINT (CUSTOM PROCEDURE TRAY) ×2 IMPLANT
PAD ARMBOARD 7.5X6 YLW CONV (MISCELLANEOUS) ×4 IMPLANT
RETRACTOR WND ALEXIS 18 MED (MISCELLANEOUS) ×1 IMPLANT
RTRCTR WOUND ALEXIS 18CM MED (MISCELLANEOUS) ×2
STAPLER VISISTAT 35W (STAPLE) ×2 IMPLANT
SUT ETHIBOND NAB CT1 #1 30IN (SUTURE) ×6 IMPLANT
SUT MNCRL AB 3-0 PS2 18 (SUTURE) ×1 IMPLANT
SUT VIC AB 0 CT1 27 (SUTURE) ×2
SUT VIC AB 0 CT1 27XBRD ANBCTR (SUTURE) IMPLANT
SUT VIC AB 1 CT1 27 (SUTURE) ×2
SUT VIC AB 1 CT1 27XBRD ANBCTR (SUTURE) ×1 IMPLANT
SUT VIC AB 2-0 CT1 27 (SUTURE) ×2
SUT VIC AB 2-0 CT1 TAPERPNT 27 (SUTURE) ×1 IMPLANT
SUT VLOC 180 0 24IN GS25 (SUTURE) ×2 IMPLANT
SYR 50ML LL SCALE MARK (SYRINGE) ×2 IMPLANT
TOWEL OR 17X24 6PK STRL BLUE (TOWEL DISPOSABLE) ×2 IMPLANT
TOWEL OR 17X26 10 PK STRL BLUE (TOWEL DISPOSABLE) ×4 IMPLANT
WATER STERILE IRR 1000ML POUR (IV SOLUTION) ×4 IMPLANT

## 2015-09-27 NOTE — Op Note (Signed)
PRE-OP DIAGNOSIS:  LEFT HIP DEGENERATIVE JOINT DISEASE POST-OP DIAGNOSIS: same PROCEDURE:  LEFT TOTAL HIP ARTHROPLASTY ANTERIOR APPROACH ANESTHESIA:  General SURGEON:  Melrose Nakayama MD ASSISTANT:  Loni Dolly PA-C   INDICATIONS FOR PROCEDURE:  The patient is a 75 y.o. female with a long history of a painful hip.  This has persisted despite multiple conservative measures.  The patient has persisted with pain and dysfunction making rest and activity difficult.  A total hip replacement is offered as surgical treatment.  Informed operative consent was obtained after discussion of possible complications including reaction to anesthesia, infection, neurovascular injury, dislocation, DVT, PE, and death.  The importance of the postoperative rehab program to optimize result was stressed with the patient.  SUMMARY OF FINDINGS AND PROCEDURE:  Under general anesthesia through a anterior approach an the Hana table a left THR was performed.  The patient had severe degenerative change and good bone quality.  We used DePuy components to replace the hip and these were size KA11 Corail femur capped with a +1 76mm ceramic hip ball.  On the acetabular side we used a size 48 Gription shell with a plus 0 neutral polyethylene liner.  We did use a hole eliminator.  Loni Dolly PA-C assisted throughout and was invaluable to the completion of the case in that he helped position and retract while I performed the procedure.  He also closed simultaneously to help minimize OR time.  I used fluoroscopy throughout the case to check position of components and leg lengths and read all these views myself.  DESCRIPTION OF PROCEDURE:  The patient was taken to the OR suite where general anesthetic was applied.  The patient was then positioned on the Hana table supine.  All bony prominences were appropriately padded.  Prep and drape was then performed in normal sterile fashion.  The patient was given kefzol preoperative antibiotic and an  appropriate time out was performed.  We then took an anterior approach to the left hip.  Dissection was taken through adipose to the tensor fascia lata fascia.  This structure was incised longitudinally and we dissected in the intermuscular interval just medial to this muscle.  Cobra retractors were placed superior and inferior to the femoral neck superficial to the capsule.  A capsular incision was then made and the retractors were placed along the femoral neck.  Xray was brought in to get a good level for the femoral neck cut which was made with an oscillating saw and osteotome.  The femoral head was removed with a corkscrew.  The acetabulum was exposed and some labral tissues were excised. Reaming was taken to the inside wall of the pelvis and sequentially up to 1 mm smaller than the actual component.  A trial of components was done and then the aforementioned acetabular shell was placed in appropriate tilt and anteversion confirmed by fluoroscopy. The liner was placed along with the hole eliminator and attention was turned to the femur.  The leg was brought down and over into adduction and the elevator bar was used to raise the femur up gently in the wound.  The piriformis was released with care taken to preserve the obturator internus attachment and all of the posterior capsule. The femur was reamed and then broached to the appropriate size.  A trial reduction was done and the aforementioned head and neck assembly gave [place] the best stability in extension with external rotation.  Leg lengths were felt to be about equal by fluoroscopic exam.  The trial  components were removed and the wound irrigated.  We then placed the femoral component in appropriate anteversion.  The head was applied to a dry stem neck and the hip again reduced.  It was again stable in the aforementioned position.  The would was irrigated again followed by re-approximation of anterior capsule with ethibond suture. Tensor fascia was repaired  with V-loc suture  followed by subcutaneous closure with #O and #2 undyed vicryl.  Skin was closed with subQ stitch and steristrips followed by a sterile dressing.  EBL and IOF can be obtained from anesthesia records.  DISPOSITION:  The patient was extubated in the OR and taken to PACU in stable condition to be admitted to the Orthopedic Surgery for appropriate post-op care to include perioperative antibiotics and DVT prophylaxis.

## 2015-09-27 NOTE — Anesthesia Preprocedure Evaluation (Addendum)
Anesthesia Evaluation  Patient identified by MRN, date of birth, ID band Patient awake    Reviewed: Allergy & Precautions, NPO status , Patient's Chart, lab work & pertinent test results  History of Anesthesia Complications Negative for: history of anesthetic complications  Airway Mallampati: II  TM Distance: >3 FB Neck ROM: Full    Dental  (+) Teeth Intact   Pulmonary neg shortness of breath, neg sleep apnea, neg COPD, neg recent URI, former smoker,    breath sounds clear to auscultation       Cardiovascular + dysrhythmias Atrial Fibrillation  Rhythm:Regular     Neuro/Psych  Headaches, negative psych ROS   GI/Hepatic Neg liver ROS,   Endo/Other  negative endocrine ROS  Renal/GU negative Renal ROS     Musculoskeletal  (+) Arthritis ,   Abdominal   Peds  Hematology   Anesthesia Other Findings   Reproductive/Obstetrics                           Anesthesia Physical Anesthesia Plan  ASA: III  Anesthesia Plan: General   Post-op Pain Management:    Induction: Intravenous  Airway Management Planned: Oral ETT and LMA  Additional Equipment: None  Intra-op Plan:   Post-operative Plan: Extubation in OR  Informed Consent: I have reviewed the patients History and Physical, chart, labs and discussed the procedure including the risks, benefits and alternatives for the proposed anesthesia with the patient or authorized representative who has indicated his/her understanding and acceptance.   Dental advisory given  Plan Discussed with: CRNA and Surgeon  Anesthesia Plan Comments:        Anesthesia Quick Evaluation

## 2015-09-27 NOTE — Interval H&P Note (Signed)
OK for surgery PD 

## 2015-09-27 NOTE — Anesthesia Procedure Notes (Signed)
Procedure Name: Intubation Date/Time: 09/27/2015 10:42 AM Performed by: Scheryl Darter Pre-anesthesia Checklist: Patient identified, Emergency Drugs available, Suction available, Patient being monitored and Timeout performed Patient Re-evaluated:Patient Re-evaluated prior to inductionOxygen Delivery Method: Circle system utilized Preoxygenation: Pre-oxygenation with 100% oxygen Intubation Type: IV induction Ventilation: Mask ventilation without difficulty Laryngoscope Size: Miller and 2 Grade View: Grade II Tube type: Oral Tube size: 7.5 mm Number of attempts: 1 Airway Equipment and Method: Stylet Placement Confirmation: ETT inserted through vocal cords under direct vision,  positive ETCO2 and breath sounds checked- equal and bilateral Secured at: 22 cm Tube secured with: Tape Dental Injury: Teeth and Oropharynx as per pre-operative assessment  Comments: Anterior larynx/DL x 1

## 2015-09-27 NOTE — Progress Notes (Signed)
Utilization review completed.  

## 2015-09-27 NOTE — Anesthesia Postprocedure Evaluation (Signed)
Anesthesia Post Note  Patient: Tonya Turner  Procedure(s) Performed: Procedure(s) (LRB): TOTAL HIP ARTHROPLASTY ANTERIOR APPROACH (Left)  Patient location during evaluation: Other Anesthesia Type: General Level of consciousness: awake and alert Pain management: pain level controlled Vital Signs Assessment: post-procedure vital signs reviewed and stable Respiratory status: spontaneous breathing, nonlabored ventilation and respiratory function stable Cardiovascular status: blood pressure returned to baseline and stable Postop Assessment: no signs of nausea or vomiting Anesthetic complications: no    Last Vitals:  Filed Vitals:   09/27/15 1700 09/27/15 1809  BP: 116/63 119/70  Pulse: 76 78  Temp:  36.6 C  Resp: 18 16    Last Pain:  Filed Vitals:   09/27/15 2030  PainSc: 7                  Anice Wilshire A

## 2015-09-27 NOTE — Transfer of Care (Signed)
Immediate Anesthesia Transfer of Care Note  Patient: Tonya Turner  Procedure(s) Performed: Procedure(s): TOTAL HIP ARTHROPLASTY ANTERIOR APPROACH (Left)  Patient Location: PACU  Anesthesia Type:General  Level of Consciousness: awake, alert , oriented and sedated  Airway & Oxygen Therapy: Patient Spontanous Breathing and Patient connected to nasal cannula oxygen  Post-op Assessment: Report given to RN, Post -op Vital signs reviewed and stable and Patient moving all extremities  Post vital signs: Reviewed and stable  Last Vitals:  Filed Vitals:   09/27/15 0921  BP: 129/80  Pulse: 77  Temp: 36.6 C  Resp: 20    Last Pain: There were no vitals filed for this visit.       Complications: No apparent anesthesia complications

## 2015-09-28 ENCOUNTER — Encounter (HOSPITAL_COMMUNITY): Payer: Self-pay | Admitting: General Practice

## 2015-09-28 NOTE — Progress Notes (Signed)
Occupational Therapy Evaluation Patient Details Name: Tonya Turner MRN: [id number] DOB: 07-Aug-1940 Today's Date: 09/28/2015    History of Present Illness Pt presents for left THA.   Clinical Impression   PTA, pt was independent with ADLs and mobility. Pt currently requires mod assist for LB ADLs and min guard assist for transfers and ambulation. Began education on compensatory strategies and fall prevention strategies. Pt plans to d/c to SNF for post-acute rehab stay and feel as this is an appropriate discharge disposition. Pt will benefit from continued acute OT to increase idnependence and safety with ADLs and mobility.    Follow Up Recommendations  SNF    Equipment Recommendations  Other (comment) (TBD in next venue)    Recommendations for Other Services       Precautions / Restrictions Precautions Precautions: None;Anterior Hip Restrictions Weight Bearing Restrictions: Yes LLE Weight Bearing: Weight bearing as tolerated      Mobility Bed Mobility Overal bed mobility: Needs Assistance Bed Mobility: Supine to Sit     Supine to sit: Min assist     General bed mobility comments: HOB elevated, use of bedrails. Min assist for trunk support to come to sitting position and verbal cues for technique to hook R foot under L ankle to progress LLE off bed. Increased time and effort due to pain.  Transfers Overall transfer level: Needs assistance Equipment used: Rolling walker (2 wheeled) Transfers: Sit to/from Stand Sit to Stand: Min assist         General transfer comment: On initial stand, pt stood very suddenly and quickly and reported significant pain. Advised pt to control ascent and descent from all srufaces to avoid severe onset of pain. Verbal cues for safe hand placement on seated surfaces. Min assist for balance on 3rd standing attempt due to pt with significant posterior lean.     Balance Overall balance assessment: Needs assistance Sitting-balance support:  No upper extremity supported;Feet supported Sitting balance-Leahy Scale: Fair     Standing balance support: Bilateral upper extremity supported;During functional activity Standing balance-Leahy Scale: Poor                              ADL Overall ADL's : Needs assistance/impaired     Grooming: Wash/dry hands;Min guard;Standing           Upper Body Dressing : Set up;Sitting   Lower Body Dressing: Moderate assistance;Sit to/from stand;Cueing for compensatory techniques Lower Body Dressing Details (indicate cue type and reason): Cues to dress LLE first and undress it last Toilet Transfer: Min guard;Cueing for safety;Ambulation;BSC;RW Toilet Transfer Details (indicate cue type and reason): BSC over toilet Toileting- Clothing Manipulation and Hygiene: Min guard;Sit to/from stand       Functional mobility during ADLs: Min guard;Rolling walker General ADL Comments: Began education on compensatory strategies for ADLs and fall prevention strategies. No family present for OT evaluation.     Vision Vision Assessment?: No apparent visual deficits   Perception     Praxis      Pertinent Vitals/Pain Pain Assessment: 0-10 Pain Score: 6  Pain Location: L hip with movement Pain Descriptors / Indicators: Aching;Sore Pain Intervention(s): Limited activity within patient's tolerance;Monitored during session;Repositioned     Hand Dominance Right   Extremity/Trunk Assessment Upper Extremity Assessment Upper Extremity Assessment: Overall WFL for tasks assessed   Lower Extremity Assessment Lower Extremity Assessment: LLE deficits/detail LLE Deficits / Details: decreased ROM and strength as expected post op  Cervical / Trunk Assessment Cervical / Trunk Assessment: Normal   Communication Communication Communication: No difficulties   Cognition Arousal/Alertness: Awake/alert Behavior During Therapy: WFL for tasks assessed/performed Overall Cognitive Status: Within  Functional Limits for tasks assessed                     General Comments       Exercises       Shoulder Instructions      Home Living Family/patient expects to be discharged to:: Skilled nursing facility Living Arrangements: Spouse/significant other                               Additional Comments: pt's husband is 74 yo and cannot assist her physically so she has planned for SNF before home. At home, she has 4-6 STE with HR, one level home.       Prior Functioning/Environment Level of Independence: Independent             OT Diagnosis: Acute pain   OT Problem List: Decreased strength;Decreased range of motion;Decreased activity tolerance;Impaired balance (sitting and/or standing);Decreased knowledge of use of DME or AE;Decreased safety awareness;Decreased knowledge of precautions;Pain   OT Treatment/Interventions: Self-care/ADL training;Therapeutic exercise;DME and/or AE instruction;Energy conservation;Therapeutic activities;Patient/family education;Balance training    OT Goals(Current goals can be found in the care plan section) Acute Rehab OT Goals Patient Stated Goal: return home OT Goal Formulation: With patient Time For Goal Achievement: 10/12/15 Potential to Achieve Goals: Good ADL Goals Pt Will Perform Grooming: with supervision;standing Pt Will Perform Lower Body Bathing: with supervision;sit to/from stand Pt Will Perform Lower Body Dressing: with supervision;sit to/from stand Pt Will Transfer to Toilet: with supervision;ambulating;bedside commode (over toilet) Pt Will Perform Toileting - Clothing Manipulation and hygiene: with supervision;sitting/lateral leans;sit to/from stand  OT Frequency: Min 2X/week   Barriers to D/C: Decreased caregiver support  Pt's has elderly husband does not believe he can provide necessary level of assistance       Co-evaluation              End of Session Equipment Utilized During Treatment: Gait  belt;Rolling walker Nurse Communication: Mobility status  Activity Tolerance: Patient tolerated treatment well Patient left: in chair;with call bell/phone within reach   Time: 1412-1441 OT Time Calculation (min): 29 min Charges:  OT General Charges $OT Visit: 1 Procedure OT Evaluation $OT Eval Moderate Complexity: 1 Procedure OT Treatments $Self Care/Home Management : 8-22 mins G-Codes:    Redmond Baseman , OTR/L Pager: [phone]  09/28/2015, 3:32 PM

## 2015-09-28 NOTE — Care Management Note (Signed)
Case Management Note  Patient Details  Name: Tonya Turner MRN: [id number] Date of Birth: [birth year]  Subjective/Objective:              S/p left total knee arthroplasty      Action/Plan: PT recommended SNF. Referral made to CSW. Will continue to follow.  Expected Discharge Date:                  Expected Discharge Plan:  Skilled Nursing Facility  In-House Referral:  Clinical Social Work  Discharge planning Services  CM Consult  Post Acute Care Choice:    Choice offered to:     DME Arranged:    DME Agency:     HH Arranged:    [place] Agency:     Status of Service:  In process, will continue to follow  Medicare Important Message Given:    Date Medicare IM Given:    Medicare IM give by:    Date Additional Medicare IM Given:    Additional Medicare Important Message give by:     If discussed at [place] of Stay Meetings, dates discussed:    Additional Comments:  Nila Nephew, RN 09/28/2015, 12:21 PM

## 2015-09-28 NOTE — Progress Notes (Signed)
Physical Therapy Treatment Patient Details Name: Tonya Turner MRN: [id number] DOB: 05/31/1940 Today's Date: 09/28/2015    History of Present Illness Pt presents for left THA.    PT Comments    Pt progressed with mobility this afternoon, ambulating 90' with RW and min A but had difficulty remembering events of the morning and was slower to process information as well as having more pain which affected her gait pattern. PT will continue to follow.   Follow Up Recommendations  SNF     Equipment Recommendations  Rolling walker with 5" wheels    Recommendations for Other Services       Precautions / Restrictions Precautions Precautions: None Restrictions Weight Bearing Restrictions: Yes LLE Weight Bearing: Weight bearing as tolerated    Mobility  Bed Mobility Overal bed mobility: Needs Assistance Bed Mobility: Sit to Supine     Supine to sit: Min assist Sit to supine: Min assist   General bed mobility comments: min A to support LLE as well as vc's for sequencing  Transfers Overall transfer level: Needs assistance Equipment used: Rolling walker (2 wheeled) Transfers: Sit to/from Stand Sit to Stand: Min assist         General transfer comment: min A from toilet and recliner. Pt has trouble positioing self to stand, vc's for right foot further back and chest forward over knees.   Ambulation/Gait Ambulation/Gait assistance: Min assist Ambulation Distance (Feet): 90 Feet Assistive device: Rolling walker (2 wheeled) Gait Pattern/deviations: Step-through pattern;Decreased weight shift to left;Decreased stance time - left Gait velocity: decreased Gait velocity interpretation: Below normal speed for age/gender General Gait Details: pt dragging left foot with initial ambulation, was able to begin stepping it with vc's   Stairs            Wheelchair Mobility    Modified Rankin (Stroke Patients Only)       Balance Overall balance assessment: Needs  assistance Sitting-balance support: No upper extremity supported Sitting balance-Leahy Scale: Good     Standing balance support: Single extremity supported Standing balance-Leahy Scale: Poor Standing balance comment: this morning pt was able to stand without holding RW to wash hands but in PM, she required forearms braced on sink to maintain standing                    Cognition Arousal/Alertness: Awake/alert Behavior During Therapy: WFL for tasks assessed/performed Overall Cognitive Status: Within Functional Limits for tasks assessed       Memory: Decreased short-term memory              Exercises Total Joint Exercises Ankle Circles/Pumps: AROM;Both;10 reps;Supine Quad Sets: AROM;Both;10 reps;Supine Short Arc Quad: AROM;Left;10 reps;Supine    General Comments General comments (skin integrity, edema, etc.): pt had difficulty remembering morning session and thought that events of the morning took place yesterday. Repeated self several times, was overall not as "with it" in the afternoon as the morning. Also with increased pain which affected gait pattern and balance, meds requested as she had not had any since 6am per her RN      Pertinent Vitals/Pain Pain Assessment: 0-10 Pain Score: 4  Pain Location: left hip with movement Pain Descriptors / Indicators: Aching Pain Intervention(s): Limited activity within patient's tolerance;Repositioned;Patient requesting pain meds-RN notified    Home Living Family/patient expects to be discharged to:: Skilled nursing facility Living Arrangements: Spouse/significant other             Additional Comments: pt's husband is 47 yo and  cannot assist her physically so she has planned for SNF before home. At home, she has 4-6 STE with HR, one level home.     Prior Function Level of Independence: Independent          PT Goals (current goals can now be found in the care plan section) Acute Rehab PT Goals Patient Stated Goal:  return home PT Goal Formulation: With patient Time For Goal Achievement: 10/05/15 Potential to Achieve Goals: Good Progress towards PT goals: Progressing toward goals    Frequency  7X/week    PT Plan Current plan remains appropriate    Co-evaluation             End of Session Equipment Utilized During Treatment: Gait belt Activity Tolerance: Patient limited by pain Patient left: with call bell/phone within reach;in bed     Time: 1527-1600 PT Time Calculation (min) (ACUTE ONLY): 33 min  Charges:  $Gait Training: 23-37 mins                    G Codes:     Leighton Roach, PT  Acute Rehab Services  [place], [name] 09/28/2015, 4:10 PM

## 2015-09-28 NOTE — Discharge Instructions (Signed)
Information on my medicine - XARELTO® (Rivaroxaban) ° °This medication education was reviewed with me or my healthcare representative as part of my discharge preparation.  The pharmacist that spoke with me during my hospital stay was:  Tommi Crepeau M, RPH ° °Why was Xarelto® prescribed for you? °Xarelto® was prescribed for you to reduce the risk of blood clots forming after orthopedic surgery. The medical term for these abnormal blood clots is venous thromboembolism (VTE). ° °What do you need to know about xarelto® ? °Take your Xarelto® ONCE DAILY at the same time every day. °You may take it either with or without food. ° °If you have difficulty swallowing the tablet whole, you may crush it and mix in applesauce just prior to taking your dose. ° °Take Xarelto® exactly as prescribed by your doctor and DO NOT stop taking Xarelto® without talking to the doctor who prescribed the medication.  Stopping without other VTE prevention medication to take the place of Xarelto® may increase your risk of developing a clot. ° °After discharge, you should have regular check-up appointments with your healthcare provider that is prescribing your Xarelto®.   ° °What do you do if you miss a dose? °If you miss a dose, take it as soon as you remember on the same day then continue your regularly scheduled once daily regimen the next day. Do not take two doses of Xarelto® on the same day.  ° °Important Safety Information °A possible side effect of Xarelto® is bleeding. You should call your healthcare provider right away if you experience any of the following: °? Bleeding from an injury or your nose that does not stop. °? Unusual colored urine (red or dark brown) or unusual colored stools (red or black). °? Unusual bruising for unknown reasons. °? A serious fall or if you hit your head (even if there is no bleeding). ° °Some medicines may interact with Xarelto® and might increase your risk of bleeding while on Xarelto®. To help avoid this,  consult your healthcare provider or pharmacist prior to using any new prescription or non-prescription medications, including herbals, vitamins, non-steroidal anti-inflammatory drugs (NSAIDs) and supplements. ° °This website has more information on Xarelto®: www.xarelto.com. ° ° °

## 2015-09-28 NOTE — Progress Notes (Signed)
Subjective: 1 Day Post-Op Procedure(s) (LRB): TOTAL HIP ARTHROPLASTY ANTERIOR APPROACH (Left)  Activity level:  wbat Diet tolerance:  ok Voiding:  ok Patient reports pain as mild.    Objective: Vital signs in last 24 hours: Temp:  [97.8 F (36.6 C)-98.1 F (36.7 C)] 98.1 F (36.7 C) (05/03 0500) Pulse Rate:  [62-89] 89 (05/03 0500) Resp:  [11-20] 16 (05/03 0500) BP: (106-129)/(57-80) 115/57 mmHg (05/03 0500) SpO2:  [96 %-100 %] 96 % (05/03 0500) Weight:  [68.493 kg (151 lb)] 68.493 kg (151 lb) (05/02 0921)  Labs: No results for input(s): HGB in the last 72 hours. No results for input(s): WBC, RBC, HCT, PLT in the last 72 hours. No results for input(s): NA, K, CL, CO2, BUN, CREATININE, GLUCOSE, CALCIUM in the last 72 hours. No results for input(s): LABPT, INR in the last 72 hours.  Physical Exam:  Neurologically intact ABD soft Neurovascular intact Sensation intact distally Intact pulses distally Dorsiflexion/Plantar flexion intact Incision: dressing C/D/I and no drainage No cellulitis present Compartment soft  Assessment/Plan:  1 Day Post-Op Procedure(s) (LRB): TOTAL HIP ARTHROPLASTY ANTERIOR APPROACH (Left) Advance diet Up with therapy D/C IV fluids Plan for discharge tomorrow Discharge to SNF, [facility], tomorrow if doing well and cleared by PT. Continue on home Xarelto for A fib and DVT prevention. Follow up in office 2 weeks post op.  Tonya Turner, [place]wance Sachs 09/28/2015, 7:55 AM

## 2015-09-28 NOTE — NC FL2 (Signed)
[place] LEVEL OF CARE SCREENING TOOL     IDENTIFICATION  Patient Name: Tonya Turner Birthdate: [birth year] Sex: female Admission Date (Current Location): 09/27/2015  [facility] and [place] Number:  Herbalist and Address:  [facility] [place] [address] [place], [place] [address]      Provider Number: [id number]  Attending Physician Name and Address:  Melrose Nakayama, MD  Relative Name and Phone Number:       Current Level of Care: Hospital Recommended Level of Care: [place] Prior Approval Number:    Date Approved/Denied:   PASRR Number: [id number]  Discharge Plan: SNF    Current Diagnoses: Patient Active Problem List   Diagnosis Date Noted  . Primary osteoarthritis of left hip 09/27/2015  . Medication management 08/25/2014  . PAF (paroxysmal atrial fibrillation) ([place]) 08/19/2013  . Chronic anticoagulation 08/19/2013  . Dysrhythmia   . Varicose vein   . Edema of extremities   . Cancer ([place])   . Arthritis     Orientation RESPIRATION BLADDER Height & Weight     Self, Time, Situation, Place  Normal Continent Weight: 151 lb (68.493 kg) Height:  5\' 5"  (165.1 cm)  BEHAVIORAL SYMPTOMS/MOOD NEUROLOGICAL BOWEL NUTRITION STATUS      Continent    AMBULATORY STATUS COMMUNICATION OF NEEDS Skin   Limited Assist Verbally Surgical wounds                       Personal Care Assistance Level of Assistance  Feeding, Dressing, Bathing Bathing Assistance: Limited assistance Feeding assistance: Independent Dressing Assistance: Limited assistance     Functional Limitations Info             SPECIAL CARE FACTORS FREQUENCY                       Contractures Contractures Info: Not present    Additional Factors Info  Allergies   Allergies Info: [employer]           Current Medications (09/28/2015):  This is the current hospital active medication list Current Facility-Administered  Medications  Medication Dose Route Frequency Provider Last Rate Last Dose  . acetaminophen (TYLENOL) tablet 650 mg  650 mg Oral Q6H PRN Loni Dolly, PA-C       Or  . acetaminophen (TYLENOL) suppository 650 mg  650 mg Rectal Q6H PRN Loni Dolly, PA-C      . alum & mag hydroxide-simeth (MAALOX/MYLANTA) 200-200-20 MG/5ML suspension 30 mL  30 mL Oral Q4H PRN Loni Dolly, PA-C      . bisacodyl (DULCOLAX) EC tablet 5 mg  5 mg Oral Daily PRN Loni Dolly, PA-C      . diltiazem (CARDIZEM CD) 24 hr capsule 180 mg  180 mg Oral q morning - 10a Loni Dolly, PA-C   180 mg at 09/28/15 0951  . diphenhydrAMINE (BENADRYL) 12.5 MG/5ML elixir 12.5-25 mg  12.5-25 mg Oral Q4H PRN Loni Dolly, PA-C      . docusate sodium (COLACE) capsule 100 mg  100 mg Oral BID Loni Dolly, PA-C   100 mg at 09/28/15 0951  . HYDROcodone-acetaminophen (NORCO/VICODIN) 5-325 MG per tablet 1-2 tablet  1-2 tablet Oral Q4H PRN Loni Dolly, PA-C   2 tablet at 09/28/15 0600  . HYDROmorphone (DILAUDID) injection 0.5-1 mg  0.5-1 mg Intravenous Q3H PRN Loni Dolly, PA-C   1 mg at 09/27/15 1613  . loratadine (CLARITIN) tablet 10 mg  10 mg Oral Daily PRN Loni Dolly, PA-C      . menthol-cetylpyridinium (CEPACOL) lozenge 3 mg  1 lozenge Oral PRN Loni Dolly, PA-C       Or  . phenol (CHLORASEPTIC) mouth spray 1 spray  1 spray Mouth/Throat PRN Loni Dolly, PA-C      . methocarbamol (ROBAXIN) tablet 500 mg  500 mg Oral Q6H PRN Loni Dolly, PA-C   500 mg at 09/28/15 0600   Or  . methocarbamol (ROBAXIN) 500 mg in dextrose 5 % 50 mL IVPB  500 mg Intravenous Q6H PRN Loni Dolly, PA-C      . metoCLOPramide (REGLAN) tablet 5-10 mg  5-10 mg Oral Q8H PRN Loni Dolly, PA-C       Or  . metoCLOPramide (REGLAN) injection 5-10 mg  5-10 mg Intravenous Q8H PRN Loni Dolly, PA-C      . ondansetron [facility]) tablet 4 mg  4 mg Oral Q6H PRN Loni Dolly, PA-C       Or  . ondansetron [facility]) injection 4 mg  4 mg Intravenous Q6H PRN Loni Dolly, PA-C      . rivaroxaban  (XARELTO) tablet 20 mg  20 mg Oral Q supper Loni Dolly, PA-C         Discharge Medications: Please see discharge summary for a list of discharge medications.  Relevant Imaging Results:  Relevant Lab Results:   Additional Information SSN: [id number]  Dulcy Fanny, LCSW

## 2015-09-28 NOTE — Clinical Social Work Placement (Signed)
   CLINICAL SOCIAL WORK PLACEMENT  NOTE  Date:  09/28/2015  Patient Details  Name: Tonya Turner MRN: [id number] Date of Birth: [birth year]  Clinical Social Work is seeking post-discharge placement for this patient at the [place] level of care (*CSW will initial, date and re-position this form in  chart as items are completed):  Yes   Patient/family provided with [place] Work Department's list of facilities offering this level of care within the geographic area requested by the patient (or if unable, by the patient's family).  Yes   Patient/family informed of their freedom to choose among providers that offer the needed level of care, that participate in Medicare, Medicaid or managed care program needed by the patient, have an available bed and are willing to accept the patient.  Yes   Patient/family informed of [place]'s ownership interest in [facility] and [facility], as well as of the fact that they are under no obligation to receive care at these facilities.  PASRR submitted to EDS on 09/28/15     PASRR number received on 09/28/15     Existing PASRR number confirmed on       FL2 transmitted to all facilities in geographic area requested by pt/family on 09/28/15     FL2 transmitted to all facilities within larger geographic area on       Patient informed that his/her managed care company has contracts with or will negotiate with certain facilities, including the following:            Patient/family informed of bed offers received.  Patient chooses bed at       Physician recommends and patient chooses bed at      Patient to be transferred to   on  .  Patient to be transferred to facility by       Patient family notified on   of transfer.  Name of family member notified:  Patient is alert and oriented x4 and is agreeable to updating husband at time of discharge.     PHYSICIAN Please sign FL2     Additional Comment:     _______________________________________________ Dulcy Fanny, LCSW 09/28/2015, 2:06 PM

## 2015-09-28 NOTE — Clinical Social Work Note (Signed)
Clinical Social Work Assessment  Patient Details  Name: Tonya Turner MRN: [id number] Date of Birth: [birth year]  Date of referral:  09/28/15               Reason for consult:  Facility Placement                Permission sought to share information with:  Chartered certified accountant granted to share information::  Yes, Verbal Permission Granted  Name::        Agency::   [facility] SNFs with preference of [place] SNF)  Relationship::     Contact Information:     Housing/Transportation Living arrangements for the past 2 months:  [place] of Information:  Patient Patient Interpreter Needed:  None Criminal Activity/Legal Involvement Pertinent to Current Situation/Hospitalization:  No - Comment as needed Significant Relationships:  Spouse Lives with:  Spouse Do you feel safe going back to the place where you live?    Need for family participation in patient care:  Yes (Comment)  Care giving concerns:  No caregivers present at time of assessment.   Social Worker assessment / plan:  CSW was consulted for SNF placement at time of discharge.  CSW discussed disposition with patient.  Patient states she is from home with husband who is 54 years old.  Patient states her husband is not physically able to assist her at home at time of discharge.  Patient reports MD office set her up with [facility] prior to surgery.  CSW received verbal permission to send referrals to [facility] as a backup to [employer].  Patient states she will need PTAR transportation at time of discharge.  CSW to arrange.  Employment status:  Retired Nurse, adult PT Recommendations:  [place] / Referral to community resources:  [place]  Patient/Family's Response to care:  Patient is agreeable to SNF placement.  Patient/Family's Understanding of and Emotional Response to Diagnosis, Current  Treatment, and Prognosis:  Patient expressed frustration regarding placement and wishes that she could simply return home to her "normal life"; however patient was able to redirect thoughts to the reality of her therapy needs.  CSW offered support for this life change.  Emotional Assessment Appearance:  Appears older than stated age Attitude/Demeanor/Rapport:    Affect (typically observed):  Accepting, Adaptable Orientation:  Oriented to Self, Oriented to Place, Oriented to  Time, Oriented to Situation Alcohol / Substance use:  Not Applicable Psych involvement (Current and /or in the community):  No (Comment)  Discharge Needs  Concerns to be addressed:  No discharge needs identified Readmission within the last 30 days:  No Current discharge risk:  None Barriers to Discharge:  Continued Medical Work up, [place], [place], LCSW 09/28/2015, 2:02 PM

## 2015-09-28 NOTE — Evaluation (Signed)
Physical Therapy Evaluation Patient Details Name: Tonya Turner MRN: [id number] DOB: [birth year] Today's Date: 09/28/2015   History of Present Illness  Pt presents for left THA.  Clinical Impression  Pt is s/p THA resulting in the deficits listed below (see PT Problem List). Pt transferred from bed as well as toilet and ambulated 50' with RW and min A.  Pt will benefit from skilled PT to increase their independence and safety with mobility to allow discharge to the venue listed below.      Follow Up Recommendations SNF    Equipment Recommendations  Rolling walker with 5" wheels    Recommendations for Other Services       Precautions / Restrictions Precautions Precautions: None Restrictions Weight Bearing Restrictions: Yes LLE Weight Bearing: Weight bearing as tolerated      Mobility  Bed Mobility Overal bed mobility: Needs Assistance Bed Mobility: Supine to Sit     Supine to sit: Min assist     General bed mobility comments: min A to support LLE as well as vc's for sequencing  Transfers Overall transfer level: Needs assistance Equipment used: Rolling walker (2 wheeled) Transfers: Sit to/from Stand Sit to Stand: Min assist         General transfer comment: min A for power and to steady from bed and toilet, vc's for hand placement  Ambulation/Gait Ambulation/Gait assistance: Min assist Ambulation Distance (Feet): 50 Feet Assistive device: Rolling walker (2 wheeled) Gait Pattern/deviations: Step-through pattern;Decreased dorsiflexion - left;Decreased stance time - left Gait velocity: decreased Gait velocity interpretation: Below normal speed for age/gender General Gait Details: vc's for [place] left side and being mindful of left foot position as well as allowing left knee to bend during swing through  Stairs            Wheelchair Mobility    Modified Rankin (Stroke Patients Only)       Balance Overall balance assessment: No apparent balance  deficits (not formally assessed)                                           Pertinent Vitals/Pain Pain Assessment: 0-10 Pain Score: 6  Pain Location: left hip Pain Descriptors / Indicators: Aching Pain Intervention(s): Limited activity within patient's tolerance;Premedicated before session;Monitored during session         O2 sats 95% on RA  Home Living Family/patient expects to be discharged to:: Skilled nursing facility Living Arrangements: Spouse/significant other               Additional Comments: pt's husband is 12 yo and cannot assist her physically so she has planned for SNF before home. At home, she has 4-6 STE with HR, one level home.     Prior Function Level of Independence: Independent               Hand Dominance   Dominant Hand: Right    Extremity/Trunk Assessment   Upper Extremity Assessment: Overall WFL for tasks assessed           Lower Extremity Assessment: LLE deficits/detail   LLE Deficits / Details: pt stands with left foot in toes in position, she can correct, reports she noticed this beginning several months before surgery. Hip flex 2/5, ankle and knee appear WFL  Cervical / Trunk Assessment: Normal  Communication   Communication: No difficulties  Cognition Arousal/Alertness: Awake/alert Behavior During Therapy: WFL for  tasks assessed/performed Overall Cognitive Status: Within Functional Limits for tasks assessed                      General Comments      Exercises Total Joint Exercises Ankle Circles/Pumps: AROM;Both;10 reps;Supine Quad Sets: AROM;Both;10 reps;Supine Gluteal Sets: AROM;Both;10 reps;Supine Heel Slides: AROM;Both;5 reps;Supine Hip ABduction/ADduction: AAROM;Left;5 reps;Supine Straight Leg Raises: AAROM;Left;5 reps;Supine Long Arc Quad: AROM;Left;5 reps;Seated      Assessment/Plan    PT Assessment Patient needs continued PT services  PT Diagnosis Difficulty walking;Acute  pain;Abnormality of gait   PT Problem List Decreased strength;Decreased activity tolerance;Decreased balance;Decreased mobility;Decreased knowledge of use of DME;Pain  PT Treatment Interventions DME instruction;Gait training;Functional mobility training;Therapeutic activities;Therapeutic exercise;Neuromuscular re-education;Patient/family education   PT Goals (Current goals can be found in the Care Plan section) Acute Rehab PT Goals Patient Stated Goal: return home PT Goal Formulation: With patient Time For Goal Achievement: 10/05/15 Potential to Achieve Goals: Good    Frequency 7X/week   Barriers to discharge Decreased caregiver support plans for SNF    Co-evaluation               End of Session Equipment Utilized During Treatment: Gait belt Activity Tolerance: Patient tolerated treatment well Patient left: in chair;with call bell/phone within reach Nurse Communication: Mobility status         Time: 0813-0859 PT Time Calculation (min) (ACUTE ONLY): 46 min   Charges:   PT Evaluation $PT Eval Moderate Complexity: 1 Procedure PT Treatments $Gait Training: 8-22 mins $Therapeutic Activity: 8-22 mins   PT G Codes:      Leighton Roach, PT  Acute Rehab Services  [place], [place] 09/28/2015, 9:58 AM

## 2015-09-29 ENCOUNTER — Encounter (HOSPITAL_COMMUNITY): Payer: Self-pay | Admitting: Orthopaedic Surgery

## 2015-09-29 DIAGNOSIS — M6281 Muscle weakness (generalized): Secondary | ICD-10-CM | POA: Diagnosis not present

## 2015-09-29 DIAGNOSIS — R21 Rash and other nonspecific skin eruption: Secondary | ICD-10-CM | POA: Diagnosis not present

## 2015-09-29 DIAGNOSIS — R2681 Unsteadiness on feet: Secondary | ICD-10-CM | POA: Diagnosis not present

## 2015-09-29 DIAGNOSIS — R262 Difficulty in walking, not elsewhere classified: Secondary | ICD-10-CM | POA: Diagnosis not present

## 2015-09-29 DIAGNOSIS — R600 Localized edema: Secondary | ICD-10-CM | POA: Diagnosis not present

## 2015-09-29 DIAGNOSIS — I480 Paroxysmal atrial fibrillation: Secondary | ICD-10-CM | POA: Diagnosis not present

## 2015-09-29 DIAGNOSIS — M25559 Pain in unspecified hip: Secondary | ICD-10-CM | POA: Diagnosis not present

## 2015-09-29 DIAGNOSIS — Z96642 Presence of left artificial hip joint: Secondary | ICD-10-CM | POA: Diagnosis not present

## 2015-09-29 DIAGNOSIS — Z471 Aftercare following joint replacement surgery: Secondary | ICD-10-CM | POA: Diagnosis not present

## 2015-09-29 DIAGNOSIS — Z79899 Other long term (current) drug therapy: Secondary | ICD-10-CM | POA: Diagnosis not present

## 2015-09-29 DIAGNOSIS — M1612 Unilateral primary osteoarthritis, left hip: Secondary | ICD-10-CM | POA: Diagnosis not present

## 2015-09-29 DIAGNOSIS — K5901 Slow transit constipation: Secondary | ICD-10-CM | POA: Diagnosis not present

## 2015-09-29 MED ORDER — METHOCARBAMOL 500 MG PO TABS: 500.0000 mg | ORAL_TABLET | Freq: Four times a day (QID) | ORAL | Status: DC | PRN

## 2015-09-29 MED ORDER — HYDROCODONE-ACETAMINOPHEN 5-325 MG PO TABS: 1.0000 | ORAL_TABLET | ORAL | Status: DC | PRN

## 2015-09-29 NOTE — Clinical Social Work Note (Signed)
Patient will discharge today per MD order. Patient will discharge to: [facility] SNF RN to call report prior to transportation to: [phone] Transportation: PTAR- to be arranged once paperwork is done with [place]/patient at bedside and once [facility]) authorization has been received.  CSW sent discharge summary to SNF for review.   Nonnie Done, LCSW [phone]  2H 1-14; [place] Licensed Clinical Social Worker

## 2015-09-29 NOTE — Progress Notes (Signed)
Report called to Miguel Dibble, RN at [facility] place. Awaiting PTAR to transfer patient out.Marland Kitchen Spouse at the bedside, aware of transfer.

## 2015-09-29 NOTE — Discharge Summary (Signed)
Patient ID: Tonya Turner MRN: [id number] DOB/AGE: 01/21/1941 75 y.o.  Admit date: 09/27/2015 Discharge date: 09/29/2015  Admission Diagnoses:  Principal Problem:   Primary osteoarthritis of left hip   Discharge Diagnoses:  Same  Past Medical History  Diagnosis Date  . Varicose vein     hx. of-   . Edema of extremities     usually lower extremities- bilateral  . Eczema     history of  . Pneumonia 1940s  . Family history of adverse reaction to anesthesia     "brother is allergic and gets sick"  . Atrial fibrillation ([place])   . Migraine     rare occular migraine- none recent (09/28/2015)  . Arthritis     "hands-mild; left hip" (09/28/2015)  . Degenerative disc disease, thoracic   . DDD (degenerative disc disease), lumbar   . Basal cell carcinoma of lower leg     "cut out on both shins"    Surgeries: Procedure(s): TOTAL HIP ARTHROPLASTY ANTERIOR APPROACH on 09/27/2015   Consultants:    Discharged Condition: Improved  Hospital Course: Tonya Turner is an 75 y.o. female who was admitted 09/27/2015 for operative treatment ofPrimary osteoarthritis of left hip. Patient has severe unremitting pain that affects sleep, daily activities, and work/hobbies. After pre-op clearance the patient was taken to the operating room on 09/27/2015 and underwent  Procedure(s): TOTAL HIP ARTHROPLASTY ANTERIOR APPROACH.    Patient was given perioperative antibiotics: Anti-infectives    Start     Dose/Rate Route Frequency Ordered Stop   09/27/15 1845  ceFAZolin (ANCEF) IVPB 2g/100 mL premix     2 g 200 mL/hr over 30 Minutes Intravenous Every 6 hours 09/27/15 1804 09/28/15 0629   09/27/15 1000  ceFAZolin (ANCEF) IVPB 2g/100 mL premix  Status:  Discontinued     2 g 200 mL/hr over 30 Minutes Intravenous On call to O.R. 09/27/15 0859 09/27/15 1803   09/27/15 0850  ceFAZolin (ANCEF) 2-4 GM/100ML-% IVPB    Comments:  Merryl Hacker   : cabinet override      09/27/15 0850 09/27/15 1045       Patient  was given sequential compression devices, early ambulation, and chemoprophylaxis to prevent DVT.  Patient benefited maximally from hospital stay and there were no complications.    Recent vital signs: Patient Vitals for the past 24 hrs:  BP Temp Temp src Pulse Resp SpO2  09/28/15 2100 (!) 116/56 mmHg 99.7 F (37.6 C) Oral 93 16 97 %  09/28/15 1300 128/65 mmHg 98.2 F (36.8 C) Oral 98 16 100 %     Recent laboratory studies: No results for input(s): WBC, HGB, HCT, PLT, NA, K, CL, CO2, BUN, CREATININE, GLUCOSE, INR, CALCIUM in the last 72 hours.  Invalid input(s): PT, 2   Discharge Medications:     Medication List    TAKE these medications        calcium-vitamin D 500-200 MG-UNIT tablet  Commonly known as:  OSCAL WITH D  Take 1 tablet by mouth daily.     conjugated estrogens vaginal cream  Commonly known as:  PREMARIN  Place 1 Applicatorful vaginally once a week.     diltiazem 180 MG 24 hr capsule  Commonly known as:  CARTIA XT  Take 1 capsule (180 mg total) by mouth every morning.     diltiazem 30 MG tablet  Commonly known as:  CARDIZEM  Take 1 tablet (30 mg total) by mouth as needed (every 6 hours as needed for episode of  rapid heartbeat).     glucosamine-chondroitin 500-400 MG tablet  Take 0.5 tablets by mouth daily.     HAIR/SKIN/NAILS PO  Take 2 tablets by mouth daily.     HYDROcodone-acetaminophen 5-325 MG tablet  Commonly known as:  NORCO/VICODIN  Take 1-2 tablets by mouth every 4 (four) hours as needed (breakthrough pain).     loratadine 10 MG tablet  Commonly known as:  CLARITIN  Take 10 mg by mouth daily as needed (for hives).     methocarbamol 500 MG tablet  Commonly known as:  ROBAXIN  Take 1 tablet (500 mg total) by mouth every 6 (six) hours as needed for muscle spasms.     multivitamin with minerals Tabs tablet  Take 0.5 tablets by mouth daily.     traMADol 50 MG tablet  Commonly known as:  ULTRAM  Take 50 mg by mouth every 6 (six) hours as  needed for moderate pain.     triamcinolone cream 0.1 %  Commonly known as:  KENALOG  Apply 1 application topically as needed (rash).     Triclosan 0.3 % Bar  Apply 1 application topically 2 (two) times daily.     XARELTO 20 MG Tabs tablet  Generic drug:  rivaroxaban  TAKE 1 TABLET EVERY DAY WITH SUPPER        Diagnostic Studies: Dg Hip Operative Unilat W Or W/o Pelvis Left  09/27/2015  CLINICAL DATA:  Postop from left anterior total hip arthroplasty. EXAM: OPERATIVE LEFT HIP (WITH PELVIS IF PERFORMED) 2 VIEWS TECHNIQUE: Fluoroscopic spot image(s) were submitted for interpretation post-operatively. COMPARISON:  None. FINDINGS: Total left hip arthroplasty seen in appropriate position. No evidence of fracture or dislocation. IMPRESSION: Expected postoperative appearance of left hip arthroplasty. No evidence of fracture or dislocation. Electronically Signed   By: Earle Gell M.D.   On: 09/27/2015 12:38    Disposition: 80-Discharged to home/self-care with a planned acute care hospital inpt readmission      Discharge Instructions    Call MD / Call 911    Complete by:  As directed   If you experience chest pain or shortness of breath, CALL 911 and be transported to the hospital emergency room.  If you develope a fever above 101 F, pus (white drainage) or increased drainage or redness at the wound, or calf pain, call your surgeon's office.     Constipation Prevention    Complete by:  As directed   Drink plenty of fluids.  Prune juice may be helpful.  You may use a stool softener, such as Colace (over the counter) 100 mg twice a day.  Use MiraLax (over the counter) for constipation as needed.     Diet - low sodium heart healthy    Complete by:  As directed      Discharge instructions    Complete by:  As directed   INSTRUCTIONS AFTER JOINT REPLACEMENT   Remove items at home which could result in a fall. This includes throw rugs or furniture in walking pathways ICE to the affected joint  every three hours while awake for 30 minutes at a time, for at least the first 3-5 days, and then as needed for pain and swelling.  Continue to use ice for pain and swelling. You may notice swelling that will progress down to the foot and ankle.  This is normal after surgery.  Elevate your leg when you are not up walking on it.   Continue to use the breathing machine you got  in the hospital (incentive spirometer) which will help keep your temperature down.  It is common for your temperature to cycle up and down following surgery, especially at night when you are not up moving around and exerting yourself.  The breathing machine keeps your lungs expanded and your temperature down.   DIET:  As you were doing prior to hospitalization, we recommend a well-balanced diet.  DRESSING / WOUND CARE / SHOWERING  You may shower 3 days after surgery, but keep the wounds dry during showering.  You may use an occlusive plastic wrap (Press'n Seal for example), NO SOAKING/SUBMERGING IN THE BATHTUB.  If the bandage gets wet, change with a clean dry gauze.  If the incision gets wet, pat the wound dry with a clean towel.  ACTIVITY  Increase activity slowly as tolerated, but follow the weight bearing instructions below.   No driving for 6 weeks or until further direction given by your physician.  You cannot drive while taking narcotics.  No lifting or carrying greater than 10 lbs. until further directed by your surgeon. Avoid periods of inactivity such as sitting longer than an hour when not asleep. This helps prevent blood clots.  You may return to work once you are authorized by your doctor.     WEIGHT BEARING   Weight bearing as tolerated with assist device (walker, cane, etc) as directed, use it as long as suggested by your surgeon or therapist, typically at least 4-6 weeks.   EXERCISES  Results after joint replacement surgery are often greatly improved when you follow the exercise, range of motion and  muscle strengthening exercises prescribed by your doctor. Safety measures are also important to protect the joint from further injury. Any time any of these exercises cause you to have increased pain or swelling, decrease what you are doing until you are comfortable again and then slowly increase them. If you have problems or questions, call your caregiver or physical therapist for advice.   Rehabilitation is important following a joint replacement. After just a few days of immobilization, the muscles of the leg can become weakened and shrink (atrophy).  These exercises are designed to build up the tone and strength of the thigh and leg muscles and to improve motion. Often times heat used for twenty to thirty minutes before working out will loosen up your tissues and help with improving the range of motion but do not use heat for the first two weeks following surgery (sometimes heat can increase post-operative swelling).   These exercises can be done on a training (exercise) mat, on the floor, on a table or on a bed. Use whatever works the best and is most comfortable for you.    Use music or television while you are exercising so that the exercises are a pleasant break in your day. This will make your life better with the exercises acting as a break in your routine that you can look forward to.   Perform all exercises about fifteen times, three times per day or as directed.  You should exercise both the operative leg and the other leg as well.   Exercises include:   Quad Sets - Tighten up the muscle on the front of the thigh (Quad) and hold for 5-10 seconds.   Straight Leg Raises - With your knee straight (if you were given a brace, keep it on), lift the leg to 60 degrees, hold for 3 seconds, and slowly lower the leg.  Perform this exercise against resistance  later as your leg gets stronger.  Leg Slides: Lying on your back, slowly slide your foot toward your buttocks, bending your knee up off the floor  (only go as far as is comfortable). Then slowly slide your foot back down until your leg is flat on the floor again.  Angel Wings: Lying on your back spread your legs to the side as far apart as you can without causing discomfort.  Hamstring Strength:  Lying on your back, push your heel against the floor with your leg straight by tightening up the muscles of your buttocks.  Repeat, but this time bend your knee to a comfortable angle, and push your heel against the floor.  You may put a pillow under the heel to make it more comfortable if necessary.   A rehabilitation program following joint replacement surgery can speed recovery and prevent re-injury in the future due to weakened muscles. Contact your doctor or a physical therapist for more information on knee rehabilitation.    CONSTIPATION  Constipation is defined medically as fewer than three stools per week and severe constipation as less than one stool per week.  Even if you have a regular bowel pattern at home, your normal regimen is likely to be disrupted due to multiple reasons following surgery.  Combination of anesthesia, postoperative narcotics, change in appetite and fluid intake all can affect your bowels.   YOU MUST use at least one of the following options; they are listed in order of increasing strength to get the job done.  They are all available over the counter, and you may need to use some, POSSIBLY even all of these options:    Drink plenty of fluids (prune juice may be helpful) and high fiber foods Colace 100 mg by mouth twice a day  Senokot for constipation as directed and as needed Dulcolax (bisacodyl), take with full glass of water  Miralax (polyethylene glycol) once or twice a day as needed.  If you have tried all these things and are unable to have a bowel movement in the first 3-4 days after surgery call either your surgeon or your primary doctor.    If you experience loose stools or diarrhea, hold the medications  until you stool forms back up.  If your symptoms do not get better within 1 week or if they get worse, check with your doctor.  If you experience "the worst abdominal pain ever" or develop nausea or vomiting, please contact the office immediately for further recommendations for treatment.   ITCHING:  If you experience itching with your medications, try taking only a single pain pill, or even half a pain pill at a time.  You can also use Benadryl over the counter for itching or also to help with sleep.   TED HOSE STOCKINGS:  Use stockings on both legs until for at least 2 weeks or as directed by physician office. They may be removed at night for sleeping.  MEDICATIONS:  See your medication summary on the "After Visit Summary" that nursing will review with you.  You may have some home medications which will be placed on hold until you complete the course of blood thinner medication.  It is important for you to complete the blood thinner medication as prescribed.  PRECAUTIONS:  If you experience chest pain or shortness of breath - call 911 immediately for transfer to the hospital emergency department.   If you develop a fever greater that 101 F, purulent drainage from wound, increased redness or  drainage from wound, foul odor from the wound/dressing, or calf pain - CONTACT YOUR SURGEON.                                                   FOLLOW-UP APPOINTMENTS:  If you do not already have a post-op appointment, please call the office for an appointment to be seen by your surgeon.  Guidelines for how soon to be seen are listed in your "After Visit Summary", but are typically between 1-4 weeks after surgery.  OTHER INSTRUCTIONS:   Knee Replacement:  Do not place pillow under knee, focus on keeping the knee straight while resting. CPM instructions: 0-90 degrees, 2 hours in the morning, 2 hours in the afternoon, and 2 hours in the evening. Place foam block, curve side up under heel at all times except when  in CPM or when walking.  DO NOT modify, tear, cut, or change the foam block in any way.  MAKE SURE YOU:  Understand these instructions.  Get help right away if you are not doing well or get worse.    Thank you for letting us be a part of your medical care team.  It is a privilege we respect greatly.  We hope these instructions will help you stay on track for a fast and full recovery!     Increase activity slowly as tolerated    Complete by:  As directed            Follow-up Information    Follow up with Hessie Dibble, MD. Schedule an appointment as soon as possible for a visit in 2 weeks.   Specialty:  Orthopedic Surgery   Contact information:   [place] [address] [phone]        Signed: Rich Fuchs 09/29/2015, 10:57 AM

## 2015-09-29 NOTE — Clinical Social Work Placement (Signed)
   CLINICAL SOCIAL WORK PLACEMENT  NOTE  Date:  09/29/2015  Patient Details  Name: Tonya Turner MRN: [id number] Date of Birth: 08/02/1940  Clinical Social Work is seeking post-discharge placement for this patient at the [place] level of care (*CSW will initial, date and re-position this form in  chart as items are completed):  Yes   Patient/family provided with [place] Work Department's list of facilities offering this level of care within the geographic area requested by the patient (or if unable, by the patient's family).  Yes   Patient/family informed of their freedom to choose among providers that offer the needed level of care, that participate in Medicare, Medicaid or managed care program needed by the patient, have an available bed and are willing to accept the patient.  Yes   Patient/family informed of [place]'s ownership interest in [facility] and [facility], as well as of the fact that they are under no obligation to receive care at these facilities.  PASRR submitted to EDS on 09/28/15     PASRR number received on 09/28/15     Existing PASRR number confirmed on       FL2 transmitted to all facilities in geographic area requested by pt/family on 09/28/15     FL2 transmitted to all facilities within larger geographic area on       Patient informed that his/her managed care company has contracts with or will negotiate with certain facilities, including the following:        Yes   Patient/family informed of bed offers received.  Patient chooses bed at [facility]     Physician recommends and patient chooses bed at      Patient to be transferred to [facility] on 09/29/15.  Patient to be transferred to facility by [facility]     Patient family notified on 09/29/15 of transfer.  Name of family member notified:  Patient is alert and oriented x4 and is agreeable to updating husband at time of discharge.      PHYSICIAN Please sign FL2     Additional Comment:    _______________________________________________ Dulcy Fanny, LCSW 09/29/2015, 1:07 PM

## 2015-09-29 NOTE — Progress Notes (Signed)
Physical Therapy Treatment Patient Details Name: Tonya Turner MRN: [id number] DOB: 11-Jul-1940 Today's Date: 09/29/2015    History of Present Illness Pt presents for left THA.    PT Comments    Pt remains to require cueing through out tx and will benefit from [facility] stay to improve safety and function before d/c home.    Follow Up Recommendations  SNF     Equipment Recommendations  Rolling walker with 5" wheels    Recommendations for Other Services       Precautions / Restrictions Precautions Precautions: None Restrictions Weight Bearing Restrictions: Yes LLE Weight Bearing: Weight bearing as tolerated    Mobility  Bed Mobility               General bed mobility comments: Pt received in recliner chair.    Transfers Overall transfer level: Needs assistance Equipment used: Rolling walker (2 wheeled) Transfers: Sit to/from Stand Sit to Stand: Supervision         General transfer comment: Cues for sequencing, hand placement and forward weight shifting.  pt performed from recliner chair and BSC.  Pt required cues for hand placement.    Ambulation/Gait Ambulation/Gait assistance: Min guard Ambulation Distance (Feet): 120 Feet Assistive device: Rolling walker (2 wheeled) Gait Pattern/deviations: Step-through pattern;Antalgic;Decreased stride length Gait velocity: decreased Gait velocity interpretation: Below normal speed for age/gender General Gait Details: Pt presents with internal rotation of L hip, required cues to maintain neutral position during gait training.     Stairs            Wheelchair Mobility    Modified Rankin (Stroke Patients Only)       Balance Overall balance assessment: Needs assistance   Sitting balance-Leahy Scale: Good       Standing balance-Leahy Scale: Good                      Cognition Arousal/Alertness: Awake/alert Behavior During Therapy: WFL for tasks assessed/performed Overall Cognitive Status:  Within Functional Limits for tasks assessed       Memory: Decreased short-term memory              Exercises Total Joint Exercises Ankle Circles/Pumps: AROM;Both;10 reps;Supine Quad Sets: AROM;Both;10 reps;Supine Short Arc Quad: AROM;Left;10 reps;Supine Heel Slides: AROM;Both;Supine;10 reps Hip ABduction/ADduction: AROM;Left;10 reps;Standing Long Arc Quad: AROM;Left;Seated;10 reps Knee Flexion: AROM;Standing;Left;10 reps Marching in Standing: AROM;Left;Standing;10 reps Standing Hip Extension: AROM;Standing;Left;10 reps    General Comments        Pertinent Vitals/Pain Pain Assessment: 0-10 Pain Score: 5  Pain Descriptors / Indicators: Aching;Grimacing;Guarding;Operative site guarding Pain Intervention(s): Limited activity within patient's tolerance;Premedicated before session;Ice applied    Home Living                      Prior Function            PT Goals (current goals can now be found in the care plan section) Acute Rehab PT Goals Patient Stated Goal: return home Potential to Achieve Goals: Good Progress towards PT goals: Progressing toward goals    Frequency  7X/week    PT Plan Current plan remains appropriate    Co-evaluation             End of Session Equipment Utilized During Treatment: Gait belt Activity Tolerance: Patient limited by pain Patient left: with call bell/phone within reach;in bed     Time: [id number] PT Time Calculation (min) (ACUTE ONLY): 31 min  Charges:  $Gait Training:  8-22 mins $Therapeutic Exercise: 8-22 mins                    G Codes:      Cristela Blue Oct 07, 2015, 2:31 PM  Governor Rooks, PTA pager [phone]

## 2015-09-30 ENCOUNTER — Non-Acute Institutional Stay (SKILLED_NURSING_FACILITY): Payer: Commercial Managed Care - HMO | Admitting: Internal Medicine

## 2015-09-30 ENCOUNTER — Encounter: Payer: Self-pay | Admitting: Internal Medicine

## 2015-09-30 DIAGNOSIS — I480 Paroxysmal atrial fibrillation: Secondary | ICD-10-CM

## 2015-09-30 DIAGNOSIS — R600 Localized edema: Secondary | ICD-10-CM | POA: Diagnosis not present

## 2015-09-30 DIAGNOSIS — R2681 Unsteadiness on feet: Secondary | ICD-10-CM

## 2015-09-30 DIAGNOSIS — K5901 Slow transit constipation: Secondary | ICD-10-CM

## 2015-09-30 DIAGNOSIS — M1612 Unilateral primary osteoarthritis, left hip: Secondary | ICD-10-CM

## 2015-09-30 NOTE — Progress Notes (Signed)
LOCATION: [facility]  PCP: Irven Shelling, MD   Code Status: Full Code  Goals of care: Advanced Directive information Advanced Directives 09/28/2015  Does patient have an advance directive? Yes  Type of Advance Directive [place]  Does patient want to make changes to advanced directive? -  Copy of advanced directive(s) in chart? Yes       Extended Emergency Contact Information Primary Emergency Contact: Vonseggern,Pedro Address: [address]          [place], [place] [address] [place] Phone: [phone] Relation: Spouse   Allergies  Allergen Reactions  . Other Rash    Adhesives-skin irritation  . Iodine Solution [Povidone Iodine] Dermatitis    Topical -causes skin inflammation    Chief Complaint  Patient presents with  . New Admit To SNF    New Admission     HPI:  Patient is a 75 y.o. female seen today for short term rehabilitation post hospital admission from 09/27/15-09/29/15 with primary OA of left hip. She underwent left hip arthroplasty on 09/27/15. She is seen in her room today. She has PMH of afib, leg edema and OA among others.   Review of Systems:  Constitutional: Negative for chills, malaise and diaphoresis. Felt feverish last night. None this morning. Energy level is slowly returning.  HENT: Negative for headache, congestion, nasal discharge, sore throat, difficulty swallowing.   Eyes: Negative for blurred vision, double vision and discharge. Wears glasses.  Respiratory: Negative for cough, shortness of breath and wheezing.   Cardiovascular: Negative for chest pain, palpitations. Positive for chronic leg swelling.  Gastrointestinal: Negative for heartburn, nausea, vomiting, abdominal pain, loss of appetite. Last bowel movement was Tuesday. Passing gas. Had bowel movement every day at home. Genitourinary: Negative for dysuria and flank pain.  Musculoskeletal: Negative for back pain, fall in the facility.  Skin:  Negative for itching, rash.  Neurological: Negative for dizziness. Psychiatric/Behavioral: Negative for depression   Past Medical History  Diagnosis Date  . Varicose vein     hx. of-   . Edema of extremities     usually lower extremities- bilateral  . Eczema     history of  . Pneumonia 1940s  . Family history of adverse reaction to anesthesia     "brother is allergic and gets sick"  . Atrial fibrillation ([facility])   . Migraine     rare occular migraine- none recent (09/28/2015)  . Arthritis     "hands-mild; left hip" (09/28/2015)  . Degenerative disc disease, thoracic   . DDD (degenerative disc disease), lumbar   . Basal cell carcinoma of lower leg     "cut out on both shins"   Past Surgical History  Procedure Laterality Date  . Varicose vein surgery Left 1970s  . Tubal ligation  1970s  . Colonoscopy with propofol N/A 10/28/2012    Procedure: COLONOSCOPY WITH PROPOFOL;  Surgeon: Garlan Fair, MD;  Location: [facility];  Service: Endoscopy;  Laterality: N/A;  . Total hip arthroplasty Left 09/27/2015  . Joint replacement    . Dilation and curettage of uterus    . Basal cell carcinoma excision Bilateral     "shins"  . Total hip arthroplasty Left 09/27/2015    Procedure: TOTAL HIP ARTHROPLASTY ANTERIOR APPROACH;  Surgeon: Melrose Nakayama, MD;  Location: [facility];  Service: Orthopedics;  Laterality: Left;   Social History:   reports that she quit smoking about 32 years ago. Her smoking use included Cigarettes. She  has a 5 pack-year smoking history. She has never used smokeless tobacco. She reports that she drinks about 8.4 oz of alcohol per week. She reports that she does not use illicit drugs.  No family history on file.  Medications:   Medication List       This list is accurate as of: 09/30/15 11:30 AM.  Always use your most recent med list.               calcium-vitamin D 500-200 MG-UNIT tablet  Commonly known as:  OSCAL WITH D  Take 1 tablet by mouth daily.      conjugated estrogens vaginal cream  Commonly known as:  PREMARIN  Place 1 Applicatorful vaginally once a week.     diltiazem 180 MG 24 hr capsule  Commonly known as:  CARTIA XT  Take 1 capsule (180 mg total) by mouth every morning.     diltiazem 30 MG tablet  Commonly known as:  CARDIZEM  Take 1 tablet (30 mg total) by mouth as needed (every 6 hours as needed for episode of rapid heartbeat).     glucosamine-chondroitin 500-400 MG tablet  Take 0.5 tablets by mouth daily.     HYDROcodone-acetaminophen 5-325 MG tablet  Commonly known as:  NORCO/VICODIN  Take 1-2 tablets by mouth every 4 (four) hours as needed (breakthrough pain).     loratadine 10 MG tablet  Commonly known as:  CLARITIN  Take 10 mg by mouth daily as needed (for hives).     methocarbamol 500 MG tablet  Commonly known as:  ROBAXIN  Take 1 tablet (500 mg total) by mouth every 6 (six) hours as needed for muscle spasms.     multivitamin with minerals Tabs tablet  Take 0.5 tablets by mouth daily.     traMADol 50 MG tablet  Commonly known as:  ULTRAM  Take 50 mg by mouth every 6 (six) hours as needed for moderate pain.     triamcinolone cream 0.1 %  Commonly known as:  KENALOG  Apply 1 application topically as needed (rash).     XARELTO 20 MG Tabs tablet  Generic drug:  rivaroxaban  TAKE 1 TABLET EVERY DAY WITH SUPPER        Immunizations:  There is no immunization history on file for this patient.   Physical Exam: Filed Vitals:   09/30/15 1123  BP: 106/66  Pulse: 90  Temp: 99.2 F (37.3 C)  TempSrc: Oral  Resp: 20  Height: 5\' 5"  (1.651 m)  Weight: 151 lb (68.493 kg)  SpO2: 94%   Body mass index is 25.13 kg/(m^2).  General- elderly female, well built, in no acute distress Head- normocephalic, atraumatic Nose- no maxillary or frontal sinus tenderness, no nasal discharge Throat- moist mucus membrane Eyes- PERRLA, EOMI, no pallor, no icterus, no discharge, normal conjunctiva, normal  sclera Neck- no cervical lymphadenopathy Cardiovascular- normal s1,s2, no murmur, trace leg edema Respiratory- bilateral clear to auscultation, no wheeze, no rhonchi, no crackles, no use of accessory muscles Abdomen- bowel sounds present, soft, non tender Musculoskeletal- able to move all 4 extremities, limited left hip range of motion Neurological- alert and oriented to person, place and time Skin- warm and dry, left hip surgical incision with aquacel dressing Psychiatry- normal mood and affect    Labs reviewed: Basic Metabolic Panel:  Recent Labs  03/10/15 1345 09/21/15 09/21/15 1453  NA 138 140 140  K 3.9  --  4.0  CL 102  --  102  CO2 25  --  26  GLUCOSE 86  --  89  BUN 13 11 11   CREATININE 0.66 0.7 0.70  CALCIUM 9.1  --  9.4   CBC:  Recent Labs  03/10/15 1345 09/21/15 09/21/15 1453  WBC 8.3 7.2 7.2  NEUTROABS 5.6  --  4.8  HGB 13.4  --  14.0  HCT 39.7  --  43.8  MCV 93.6  --  95.6  PLT 349  --  357    Radiological Exams: Dg Hip Operative Unilat W Or W/o Pelvis Left  09/27/2015  CLINICAL DATA:  Postop from left anterior total hip arthroplasty. EXAM: OPERATIVE LEFT HIP (WITH PELVIS IF PERFORMED) 2 VIEWS TECHNIQUE: Fluoroscopic spot image(s) were submitted for interpretation post-operatively. COMPARISON:  None. FINDINGS: Total left hip arthroplasty seen in appropriate position. No evidence of fracture or dislocation. IMPRESSION: Expected postoperative appearance of left hip arthroplasty. No evidence of fracture or dislocation. Electronically Signed   By: Earle Gell M.D.   On: 09/27/2015 12:38    Assessment/Plan  Unsteady gait With left hip surgery. Will have patient work with PT/OT as tolerated to regain strength and restore function.  Fall precautions are in place.  Left hip OA S/p left total hip arthroplasty. Will have her work with physical therapy and occupational therapy team to help with gait training and muscle strengthening exercises.fall precautions.  Skin care. Encourage to be out of bed. Has follow up with orthopedics. Continue norco 5-325 mg 1-2 tab q4h prn pain and tramadol 50 mg q6h prn pain. Continue calcium-vitamin d. Continue robaxin 500 mg q6h prn muscle spasm. Continue xarelto for dvt prophylaxis.   Constipation start colace 100 mg bid and add miralax 17 g daily for now. Hydration encouraged  afib Rate controlled. Continue diltiazem 180 mg daily and diltiazem 30 mg q6h prn palpitation. Continue xarelto for anticoagulation. Monitor BP and HR  Leg edema Chronic, add ted hose    Goals of care: short term rehabilitation   Labs/tests ordered: cbc, cmp 10/03/15  Family/ staff Communication: reviewed care plan with patient and nursing supervisor    Blanchie Serve, MD Internal Medicine [place], [place] [address] Cell Phone (Monday-Friday 8 am - 5 pm): [phone] On Call: [phone] and follow prompts after 5 pm and on weekends Office Phone: [phone] Office Fax: [phone]

## 2015-10-03 LAB — CBC AND DIFFERENTIAL
HCT: 33 % — AB (ref 36–46)
HEMOGLOBIN: 10.7 g/dL — AB (ref 12.0–16.0)
Neutrophils Absolute: 3 /uL
PLATELETS: 363 10*3/uL (ref 150–399)
WBC: 6.1 10^3/mL

## 2015-10-03 LAB — BASIC METABOLIC PANEL
BUN: 9 mg/dL (ref 4–21)
CREATININE: 0.6 mg/dL (ref 0.5–1.1)
Glucose: 100 mg/dL
POTASSIUM: 4.2 mmol/L (ref 3.4–5.3)
SODIUM: 140 mmol/L (ref 137–147)

## 2015-10-03 LAB — HEPATIC FUNCTION PANEL
ALK PHOS: 54 U/L (ref 25–125)
ALT: 18 U/L (ref 7–35)
AST: 17 U/L (ref 13–35)
Bilirubin, Total: 0.7 mg/dL

## 2015-10-07 ENCOUNTER — Non-Acute Institutional Stay: Payer: Commercial Managed Care - HMO | Admitting: Adult Health

## 2015-10-07 ENCOUNTER — Encounter: Payer: Self-pay | Admitting: Adult Health

## 2015-10-07 DIAGNOSIS — R2681 Unsteadiness on feet: Secondary | ICD-10-CM

## 2015-10-07 DIAGNOSIS — I480 Paroxysmal atrial fibrillation: Secondary | ICD-10-CM

## 2015-10-07 DIAGNOSIS — R21 Rash and other nonspecific skin eruption: Secondary | ICD-10-CM | POA: Diagnosis not present

## 2015-10-07 DIAGNOSIS — K5901 Slow transit constipation: Secondary | ICD-10-CM

## 2015-10-07 DIAGNOSIS — M1612 Unilateral primary osteoarthritis, left hip: Secondary | ICD-10-CM | POA: Diagnosis not present

## 2015-10-07 NOTE — Progress Notes (Signed)
Patient ID: Tonya Turner, female   DOB: 1941/02/26, 75 y.o.   MRN: [id number]    DATE:  10/07/2015   MRN:  [id number]  BIRTHDAY: [birth year]  Facility:  [facility] Location:  [place] Room Number: [id number]  LEVEL OF CARE:  SNF (31)  Contact Information    Name Relation Home Work Mobile   [place], [place] [phone]  [phone]       Code Status History    This patient does not have a recorded code status. Please follow your organizational policy for patients in this situation.       Chief Complaint  Patient presents with  . Discharge Note    HISTORY OF PRESENT ILLNESS:  This is a 75 year old female who is for discharge home with Home health PT, OT and Nursing. DME:  [facility]side [facility]. She has been admitted to [facility] on 09/29/15. She has PMH of atrial fibrillation, leg edema and osteoarthritis. She was hospitalized due to primary osteoarthritis of left hip for which she had left hip arthroplasty on 09/27/15.  Patient was admitted to this facility for short-term rehabilitation after the patient's recent hospitalization.  Patient has completed SNF rehabilitation and therapy has cleared the patient for discharge.   PAST MEDICAL HISTORY:  Past Medical History  Diagnosis Date  . Varicose vein     hx. of-   . Edema of extremities     usually lower extremities- bilateral  . Eczema     history of  . Pneumonia 1940s  . Family history of adverse reaction to anesthesia     "brother is allergic and gets sick"  . Atrial fibrillation ([place])   . Migraine     rare occular migraine- none recent (09/28/2015)  . Arthritis     "hands-mild; left hip" (09/28/2015)  . Degenerative disc disease, thoracic   . DDD (degenerative disc disease), lumbar   . Basal cell carcinoma of lower leg     "cut out on both shins"  . Unsteady gait   . Primary osteoarthritis of left hip   . PAF (paroxysmal atrial fibrillation) ([facility])      CURRENT MEDICATIONS:  Reviewed  Patient's Medications  New Prescriptions   No medications on file  Previous Medications   CALCIUM-VITAMIN D (OSCAL WITH D) 500-200 MG-UNIT PER TABLET    Take 1 tablet by mouth daily.   DILTIAZEM (CARDIZEM) 30 MG TABLET    Take 1 tablet (30 mg total) by mouth as needed (every 6 hours as needed for episode of rapid heartbeat).   DILTIAZEM (CARTIA XT) 180 MG 24 HR CAPSULE    Take 1 capsule (180 mg total) by mouth every morning.   DOCUSATE SODIUM (COLACE) 100 MG CAPSULE    Take 100 mg by mouth 2 (two) times daily.   GLUCOSAMINE-CHONDROITIN 500-400 MG TABLET    Take 0.5 tablets by mouth daily.    HYDROCODONE-ACETAMINOPHEN (NORCO/VICODIN) 5-325 MG TABLET    Take 1-2 tablets by mouth every 4 (four) hours as needed (breakthrough pain).   HYDROCORTISONE CREAM 1 %    Apply 1 application topically 2 (two) times daily. Take for 2 weeks   LORATADINE (CLARITIN) 10 MG TABLET    Take 10 mg by mouth daily. Take 1 qd, stopping on 5/24.  May also take 1 QD PRN, stopping on 7/6.   METHOCARBAMOL (ROBAXIN) 500 MG TABLET    Take 1 tablet (500 mg total) by mouth every 6 (six) hours as needed for  muscle spasms.   MULTIPLE VITAMIN (MULTIVITAMIN WITH MINERALS) TABS    Take 0.5 tablets by mouth daily.   POLYETHYLENE GLYCOL (MIRALAX / GLYCOLAX) PACKET    Take 17 g by mouth daily.   TRAMADOL (ULTRAM) 50 MG TABLET    Take 50 mg by mouth every 6 (six) hours as needed for moderate pain.    XARELTO 20 MG TABS TABLET    TAKE 1 TABLET EVERY DAY WITH SUPPER  Modified Medications   No medications on file  Discontinued Medications   CONJUGATED ESTROGENS (PREMARIN) VAGINAL CREAM    Place 1 Applicatorful vaginally once a week.    TRIAMCINOLONE CREAM (KENALOG) 0.1 %    Apply 1 application topically as needed (rash).     Allergies  Allergen Reactions  . Other Rash    Adhesives-skin irritation  . Iodine Solution [Povidone Iodine] Dermatitis    Topical -causes skin inflammation     REVIEW OF SYSTEMS:  GENERAL: no  change in appetite, no fatigue, no weight changes, no fever, chills or weakness EYES: Denies change in vision, dry eyes, eye pain, itching or discharge EARS: Denies change in hearing, ringing in ears, or earache NOSE: Denies nasal congestion or epistaxis MOUTH and THROAT: Denies oral discomfort, gingival pain or bleeding, pain from teeth or hoarseness   RESPIRATORY: no cough, SOB, DOE, wheezing, hemoptysis CARDIAC: no chest pain, edema or palpitations GI: no abdominal pain, diarrhea, constipation, heart burn, nausea or vomiting GU: Denies dysuria, frequency, hematuria, incontinence, or discharge PSYCHIATRIC: Denies feeling of depression or anxiety. No report of hallucinations, insomnia, paranoia, or agitation   PHYSICAL EXAMINATION  GENERAL APPEARANCE: Well nourished. In no acute distress. Normal body habitus SKIN:  Left hip surgical incision is covered with aquacel dressing, dry, no erythema, rashes on upper back HEAD: Normal in size and contour. No evidence of trauma EYES: Lids open and close normally. No blepharitis, entropion or ectropion. PERRL. Conjunctivae are clear and sclerae are white. Lenses are without opacity EARS: Pinnae are normal. Patient hears normal voice tunes of the examiner MOUTH and THROAT: Lips are without lesions. Oral mucosa is moist and without lesions. Tongue is normal in shape, size, and color and without lesions NECK: supple, trachea midline, no neck masses, no thyroid tenderness, no thyromegaly LYMPHATICS: no LAN in the neck, no supraclavicular LAN RESPIRATORY: breathing is even & unlabored, BS CTAB CARDIAC: RRR, no murmur,no extra heart sounds, no edema GI: abdomen soft, normal BS, no masses, no tenderness, no hepatomegaly, no splenomegaly EXTREMITIES:  Able to move X 4 extremities PSYCHIATRIC: Alert and oriented X 3. Affect and behavior are appropriate  LABS/RADIOLOGY: Labs reviewed: Basic Metabolic Panel:  Recent Labs  03/10/15 1345 09/21/15  09/21/15 1453 10/03/15  NA 138 140 140 140  K 3.9  --  4.0 4.2  CL 102  --  102  --   CO2 25  --  26  --   GLUCOSE 86  --  89  --   BUN 13 11 11 9   CREATININE 0.66 0.7 0.70 0.6  CALCIUM 9.1  --  9.4  --    Liver Function Tests:  Recent Labs  10/03/15  AST 17  ALT 18  ALKPHOS 54   CBC:  Recent Labs  03/10/15 1345 09/21/15 09/21/15 1453 10/03/15  WBC 8.3 7.2 7.2 6.1  NEUTROABS 5.6  --  4.8 3  HGB 13.4  --  14.0 10.7*  HCT 39.7  --  43.8 33*  MCV 93.6  --  95.6  --  PLT 349  --  357 363       Dg Hip Operative Unilat W Or W/o Pelvis Left  09/27/2015  CLINICAL DATA:  Postop from left anterior total hip arthroplasty. EXAM: OPERATIVE LEFT HIP (WITH PELVIS IF PERFORMED) 2 VIEWS TECHNIQUE: Fluoroscopic spot image(s) were submitted for interpretation post-operatively. COMPARISON:  None. FINDINGS: Total left hip arthroplasty seen in appropriate position. No evidence of fracture or dislocation. IMPRESSION: Expected postoperative appearance of left hip arthroplasty. No evidence of fracture or dislocation. Electronically Signed   By: Earle Gell M.D.   On: 09/27/2015 12:38    ASSESSMENT/PLAN:  Unsteady gait - for Home health PT, OT and Nursing  Left hip Osteoarthritis S/P left total hip arthroplasty - for Home health PT, OT and Nursing; continue Norco 5/325 mg 1-2 tabs PO Q 4 hours PRN and Ultram 50 mg 1 tab PO Q 6 hours PRN for pain; Robaxn 500 mg 1 tab PO Q 6 hours PRN; Xarelto 20 mg 1 tab PO daily for DVT prophylaxis; follow-up with Dr. Rhona Raider, orthopedic surgeon  Constipation - continue Colace 100 mg 1 capsule PO BID and Miralax 17 gm PO Q D PRN  Atrial Fibrillation - rate-controlled; continue Diltiazem 24HR ER 180 mg 1 capsule daily and Diltiazem 30 mg  Q 6 hours PRN and Xarelto 20 mg 1 tab PO daily  Rashes on back - continue Hydrocortisone 1 % cream to rashes on back BID X 2 weeks and continue Claritin 10 mg daily      I have filled out patient's discharge paperwork  and written prescriptions.  Patient will receive home health PT, OT and Nursing.  DME provided:  Bedside [facility]  Total discharge time: Greater than 30 minutes  Discharge time involved coordination of the discharge process with social worker, nursing staff and therapy department. Medical justification for home health services/DME verified.      Durenda Age, NP [employer] [phone]

## 2015-10-07 NOTE — Progress Notes (Signed)
This encounter was created in error - please disregard.

## 2015-10-11 DIAGNOSIS — M19042 Primary osteoarthritis, left hand: Secondary | ICD-10-CM | POA: Diagnosis not present

## 2015-10-11 DIAGNOSIS — M5136 Other intervertebral disc degeneration, lumbar region: Secondary | ICD-10-CM | POA: Diagnosis not present

## 2015-10-11 DIAGNOSIS — M19041 Primary osteoarthritis, right hand: Secondary | ICD-10-CM | POA: Diagnosis not present

## 2015-10-11 DIAGNOSIS — R600 Localized edema: Secondary | ICD-10-CM | POA: Diagnosis not present

## 2015-10-11 DIAGNOSIS — Z471 Aftercare following joint replacement surgery: Secondary | ICD-10-CM | POA: Diagnosis not present

## 2015-10-11 DIAGNOSIS — M5134 Other intervertebral disc degeneration, thoracic region: Secondary | ICD-10-CM | POA: Diagnosis not present

## 2015-10-12 DIAGNOSIS — M1612 Unilateral primary osteoarthritis, left hip: Secondary | ICD-10-CM | POA: Diagnosis not present

## 2015-10-13 DIAGNOSIS — M5134 Other intervertebral disc degeneration, thoracic region: Secondary | ICD-10-CM | POA: Diagnosis not present

## 2015-10-13 DIAGNOSIS — M5136 Other intervertebral disc degeneration, lumbar region: Secondary | ICD-10-CM | POA: Diagnosis not present

## 2015-10-13 DIAGNOSIS — M19041 Primary osteoarthritis, right hand: Secondary | ICD-10-CM | POA: Diagnosis not present

## 2015-10-13 DIAGNOSIS — R600 Localized edema: Secondary | ICD-10-CM | POA: Diagnosis not present

## 2015-10-13 DIAGNOSIS — M19042 Primary osteoarthritis, left hand: Secondary | ICD-10-CM | POA: Diagnosis not present

## 2015-10-13 DIAGNOSIS — Z471 Aftercare following joint replacement surgery: Secondary | ICD-10-CM | POA: Diagnosis not present

## 2015-10-14 DIAGNOSIS — M5134 Other intervertebral disc degeneration, thoracic region: Secondary | ICD-10-CM | POA: Diagnosis not present

## 2015-10-14 DIAGNOSIS — R600 Localized edema: Secondary | ICD-10-CM | POA: Diagnosis not present

## 2015-10-14 DIAGNOSIS — M19041 Primary osteoarthritis, right hand: Secondary | ICD-10-CM | POA: Diagnosis not present

## 2015-10-14 DIAGNOSIS — M19042 Primary osteoarthritis, left hand: Secondary | ICD-10-CM | POA: Diagnosis not present

## 2015-10-14 DIAGNOSIS — M5136 Other intervertebral disc degeneration, lumbar region: Secondary | ICD-10-CM | POA: Diagnosis not present

## 2015-10-14 DIAGNOSIS — Z471 Aftercare following joint replacement surgery: Secondary | ICD-10-CM | POA: Diagnosis not present

## 2015-10-15 DIAGNOSIS — R600 Localized edema: Secondary | ICD-10-CM | POA: Diagnosis not present

## 2015-10-15 DIAGNOSIS — Z471 Aftercare following joint replacement surgery: Secondary | ICD-10-CM | POA: Diagnosis not present

## 2015-10-15 DIAGNOSIS — M5136 Other intervertebral disc degeneration, lumbar region: Secondary | ICD-10-CM | POA: Diagnosis not present

## 2015-10-15 DIAGNOSIS — M19042 Primary osteoarthritis, left hand: Secondary | ICD-10-CM | POA: Diagnosis not present

## 2015-10-15 DIAGNOSIS — M5134 Other intervertebral disc degeneration, thoracic region: Secondary | ICD-10-CM | POA: Diagnosis not present

## 2015-10-15 DIAGNOSIS — M19041 Primary osteoarthritis, right hand: Secondary | ICD-10-CM | POA: Diagnosis not present

## 2015-10-17 DIAGNOSIS — M19041 Primary osteoarthritis, right hand: Secondary | ICD-10-CM | POA: Diagnosis not present

## 2015-10-17 DIAGNOSIS — R600 Localized edema: Secondary | ICD-10-CM | POA: Diagnosis not present

## 2015-10-17 DIAGNOSIS — Z471 Aftercare following joint replacement surgery: Secondary | ICD-10-CM | POA: Diagnosis not present

## 2015-10-17 DIAGNOSIS — M5136 Other intervertebral disc degeneration, lumbar region: Secondary | ICD-10-CM | POA: Diagnosis not present

## 2015-10-17 DIAGNOSIS — M19042 Primary osteoarthritis, left hand: Secondary | ICD-10-CM | POA: Diagnosis not present

## 2015-10-17 DIAGNOSIS — M5134 Other intervertebral disc degeneration, thoracic region: Secondary | ICD-10-CM | POA: Diagnosis not present

## 2015-10-19 ENCOUNTER — Telehealth: Payer: Self-pay | Admitting: Cardiology

## 2015-10-19 DIAGNOSIS — M5134 Other intervertebral disc degeneration, thoracic region: Secondary | ICD-10-CM | POA: Diagnosis not present

## 2015-10-19 DIAGNOSIS — M19042 Primary osteoarthritis, left hand: Secondary | ICD-10-CM | POA: Diagnosis not present

## 2015-10-19 DIAGNOSIS — M19041 Primary osteoarthritis, right hand: Secondary | ICD-10-CM | POA: Diagnosis not present

## 2015-10-19 DIAGNOSIS — Z471 Aftercare following joint replacement surgery: Secondary | ICD-10-CM | POA: Diagnosis not present

## 2015-10-19 DIAGNOSIS — R600 Localized edema: Secondary | ICD-10-CM | POA: Diagnosis not present

## 2015-10-19 DIAGNOSIS — M5136 Other intervertebral disc degeneration, lumbar region: Secondary | ICD-10-CM | POA: Diagnosis not present

## 2015-10-19 NOTE — Telephone Encounter (Signed)
New message    Patient calling recent had hip replacement - Afib incident x 4 wondering what should she need to do next.

## 2015-10-19 NOTE — Telephone Encounter (Signed)
New message      Pt had an episode of AFIB a few days ago.  She has had 3 AFIB episodes since her total hip surgery end of April.  Nurse was at her home a few days ago and at rest her HR was 76, bp 110/66.  She had an "episode" while nurse was there and her HR was 124.  Nurse could not get a bp during the episode.  When she did get a bp it was 130/68.  Pt took a 30mg  diltiazem and 6 min later her HR was 72.  Please call nurse to advise on what to do.

## 2015-10-19 NOTE — Telephone Encounter (Signed)
Returned call to patient.She stated she was calling back for Dr.Skains advice.Stated she has had 4 episodes of AFib since her hip replacement 09/27/15.Stated each episode lasted appox 10 min.Stated she is taking medications as prescribed,has not missed any doses.Stated she was just concerned she thought having the surgery might have caused.She wanted to know what Dr.Skains thinks.She is presently ok in rhythm.Advised I will send message to Dr.Skains.

## 2015-10-19 NOTE — Telephone Encounter (Signed)
Returned call to patient.Dr.Skains advice given.Patient reassured.Advised to call back if needed.

## 2015-10-19 NOTE — Telephone Encounter (Signed)
Agree with current use of PRN diltiazem. I do think that the hip surgery precipitated some of those episodes. This is quite normal during this inflammatory period.  Thank you for the update Candee Furbish, MD

## 2015-10-19 NOTE — Telephone Encounter (Signed)
Received a call from [place] PT with [facility].She stated pt had a left total hip replacement on 09/27/15.Stated patient is concerned since her surgery she has had 3 episodes of AFib.Stated last episode was this past Mon while standing at kitchen sink.She checked her apical pulse 124 beats/min ,B/P 130/68.Pt took a extra Diltiazem 30 mg and in 5 min heart rate 72.Stated she is feeling good just concerned she rarely has AFib.Advised to continue to take a extra Diltiazem 30 mg if needed.I will send message to Dr.Skains for advice.

## 2015-10-19 NOTE — Telephone Encounter (Signed)
Left Mechele Claude at [facility] a message regarding pt, to call back.

## 2015-10-21 DIAGNOSIS — R600 Localized edema: Secondary | ICD-10-CM | POA: Diagnosis not present

## 2015-10-21 DIAGNOSIS — M5136 Other intervertebral disc degeneration, lumbar region: Secondary | ICD-10-CM | POA: Diagnosis not present

## 2015-10-21 DIAGNOSIS — M5134 Other intervertebral disc degeneration, thoracic region: Secondary | ICD-10-CM | POA: Diagnosis not present

## 2015-10-21 DIAGNOSIS — M19042 Primary osteoarthritis, left hand: Secondary | ICD-10-CM | POA: Diagnosis not present

## 2015-10-21 DIAGNOSIS — M19041 Primary osteoarthritis, right hand: Secondary | ICD-10-CM | POA: Diagnosis not present

## 2015-10-21 DIAGNOSIS — Z471 Aftercare following joint replacement surgery: Secondary | ICD-10-CM | POA: Diagnosis not present

## 2015-10-25 DIAGNOSIS — M19041 Primary osteoarthritis, right hand: Secondary | ICD-10-CM | POA: Diagnosis not present

## 2015-10-25 DIAGNOSIS — M5134 Other intervertebral disc degeneration, thoracic region: Secondary | ICD-10-CM | POA: Diagnosis not present

## 2015-10-25 DIAGNOSIS — M19042 Primary osteoarthritis, left hand: Secondary | ICD-10-CM | POA: Diagnosis not present

## 2015-10-25 DIAGNOSIS — R600 Localized edema: Secondary | ICD-10-CM | POA: Diagnosis not present

## 2015-10-25 DIAGNOSIS — M5136 Other intervertebral disc degeneration, lumbar region: Secondary | ICD-10-CM | POA: Diagnosis not present

## 2015-10-25 DIAGNOSIS — Z471 Aftercare following joint replacement surgery: Secondary | ICD-10-CM | POA: Diagnosis not present

## 2015-10-26 DIAGNOSIS — M1612 Unilateral primary osteoarthritis, left hip: Secondary | ICD-10-CM | POA: Diagnosis not present

## 2015-10-27 ENCOUNTER — Other Ambulatory Visit: Payer: Self-pay | Admitting: Cardiology

## 2015-10-27 DIAGNOSIS — Z471 Aftercare following joint replacement surgery: Secondary | ICD-10-CM | POA: Diagnosis not present

## 2015-10-27 DIAGNOSIS — M19041 Primary osteoarthritis, right hand: Secondary | ICD-10-CM | POA: Diagnosis not present

## 2015-10-27 DIAGNOSIS — R600 Localized edema: Secondary | ICD-10-CM | POA: Diagnosis not present

## 2015-10-27 DIAGNOSIS — M5136 Other intervertebral disc degeneration, lumbar region: Secondary | ICD-10-CM | POA: Diagnosis not present

## 2015-10-27 DIAGNOSIS — M19042 Primary osteoarthritis, left hand: Secondary | ICD-10-CM | POA: Diagnosis not present

## 2015-10-27 DIAGNOSIS — M5134 Other intervertebral disc degeneration, thoracic region: Secondary | ICD-10-CM | POA: Diagnosis not present

## 2015-10-28 DIAGNOSIS — M25552 Pain in left hip: Secondary | ICD-10-CM | POA: Diagnosis not present

## 2015-10-31 ENCOUNTER — Other Ambulatory Visit: Payer: Self-pay | Admitting: Cardiology

## 2015-10-31 ENCOUNTER — Telehealth: Payer: Self-pay | Admitting: Cardiology

## 2015-10-31 NOTE — Telephone Encounter (Signed)
New message   Pt is calling she said she is healing from hip replacement and she needs calcium  She is on a blocker for her a-fib  How much calcium can I take?"

## 2015-10-31 NOTE — Telephone Encounter (Signed)
Pt states that she is recently post hip replacement.  Pt states she is currently taking calcium 250mg  daily to help with healing after hip replacement. Pt states she started this on her own. Pt states that [facility] does not think 250mg  of calcium daily is enough since she is post hip replacement. Pt states she does not have osteoporosis.  Pt asking for a recommendation from Dr Marlou Porch about calcium dose to help with healing after hip replacement.  Pt states her orthopedic MD or primary care MD have not made a recommendation for calcium supplementation, she feels Dr Marlou Porch is the appropriate provider to make this recommendation.  Pt advised I will forward to Dr Marlou Porch for review.

## 2015-11-01 DIAGNOSIS — M25552 Pain in left hip: Secondary | ICD-10-CM | POA: Diagnosis not present

## 2015-11-01 NOTE — Telephone Encounter (Signed)
Pt advised Dr Marlou Porch recommended pt contact PCP to recommend calcium dose.

## 2015-11-01 NOTE — Telephone Encounter (Signed)
PCP please  Candee Furbish, MD

## 2015-11-01 NOTE — Telephone Encounter (Signed)
LMTCB

## 2015-11-04 DIAGNOSIS — M25552 Pain in left hip: Secondary | ICD-10-CM | POA: Diagnosis not present

## 2015-11-07 DIAGNOSIS — M25552 Pain in left hip: Secondary | ICD-10-CM | POA: Diagnosis not present

## 2015-11-09 DIAGNOSIS — N83209 Unspecified ovarian cyst, unspecified side: Secondary | ICD-10-CM | POA: Diagnosis not present

## 2015-11-10 DIAGNOSIS — M25552 Pain in left hip: Secondary | ICD-10-CM | POA: Diagnosis not present

## 2015-11-14 DIAGNOSIS — M25552 Pain in left hip: Secondary | ICD-10-CM | POA: Diagnosis not present

## 2015-11-17 DIAGNOSIS — M25552 Pain in left hip: Secondary | ICD-10-CM | POA: Diagnosis not present

## 2015-11-21 DIAGNOSIS — M25552 Pain in left hip: Secondary | ICD-10-CM | POA: Diagnosis not present

## 2015-11-24 DIAGNOSIS — M25552 Pain in left hip: Secondary | ICD-10-CM | POA: Diagnosis not present

## 2015-11-30 DIAGNOSIS — M25552 Pain in left hip: Secondary | ICD-10-CM | POA: Diagnosis not present

## 2015-12-15 ENCOUNTER — Encounter: Payer: Self-pay | Admitting: Podiatry

## 2015-12-15 ENCOUNTER — Ambulatory Visit (INDEPENDENT_AMBULATORY_CARE_PROVIDER_SITE_OTHER): Payer: Commercial Managed Care - HMO | Admitting: Podiatry

## 2015-12-15 VITALS — BP 95/67 | HR 80 | Resp 16

## 2015-12-15 DIAGNOSIS — M779 Enthesopathy, unspecified: Secondary | ICD-10-CM

## 2015-12-17 NOTE — Progress Notes (Signed)
Subjective:     Patient ID: Tonya Turner, female   DOB: November 13, 1940, 75 y.o.   MRN: [id number]  HPI patient presents stating she feels like something is coming through the bottom of her right foot and the bone still prominent and it seems that she has increased irritation   Review of Systems     Objective:   Physical Exam Neurovascular status intact muscle strength adequate with discomfort in the plantar aspect right foot around the lesser metatarsal heads localized in nature with no indications of proximal edema erythema or drainage noted    Assessment:     Appears to be prominent metatarsal bone secondary to mild-to-moderate digital contraction creating pressure against the metatarsals localized in nature    Plan:     Reviewed condition and recommended padding therapy to disperse weight off the joint surfaces and explained anti-inflammatories physical therapy and went ahead and scanned for orthotics to try to take stress off the plantar feet bilateral

## 2015-12-28 DIAGNOSIS — M25552 Pain in left hip: Secondary | ICD-10-CM | POA: Diagnosis not present

## 2016-01-04 ENCOUNTER — Ambulatory Visit: Payer: Commercial Managed Care - HMO | Admitting: *Deleted

## 2016-01-04 ENCOUNTER — Other Ambulatory Visit: Payer: Self-pay | Admitting: Dermatology

## 2016-01-04 DIAGNOSIS — L570 Actinic keratosis: Secondary | ICD-10-CM | POA: Diagnosis not present

## 2016-01-04 DIAGNOSIS — L821 Other seborrheic keratosis: Secondary | ICD-10-CM | POA: Diagnosis not present

## 2016-01-04 DIAGNOSIS — M779 Enthesopathy, unspecified: Secondary | ICD-10-CM

## 2016-01-04 DIAGNOSIS — D485 Neoplasm of uncertain behavior of skin: Secondary | ICD-10-CM | POA: Diagnosis not present

## 2016-01-04 DIAGNOSIS — D239 Other benign neoplasm of skin, unspecified: Secondary | ICD-10-CM | POA: Diagnosis not present

## 2016-01-04 NOTE — Patient Instructions (Signed)

## 2016-01-04 NOTE — Progress Notes (Signed)
Patient ID: Tonya Turner, female   DOB: July 10, 1940, 75 y.o.   MRN: [id number] Patient presents for orthotic pick up.  Verbal and written break in and wear instructions given.  Patient will follow up in 4 weeks if symptoms worsen or fail to improve.

## 2016-01-09 ENCOUNTER — Telehealth: Payer: Self-pay | Admitting: *Deleted

## 2016-01-09 NOTE — Telephone Encounter (Signed)
needs Xarelto refill, [facility] refused to fill this stating they did not have a refill on file, we show one 10/27/15 to them, will call and see what i can find out and call the pt back with the info.  Caren Griffins verified they have the [place] on file with refills from the 10/27/15 send in, verified they are getting order ready & shipping order.

## 2016-02-02 ENCOUNTER — Other Ambulatory Visit: Payer: Self-pay | Admitting: *Deleted

## 2016-02-02 MED ORDER — DILTIAZEM HCL ER COATED BEADS 180 MG PO CP24
180.0000 mg | ORAL_CAPSULE | Freq: Every morning | ORAL | 1 refills | Status: DC
Start: 2016-02-02 — End: 2016-05-24

## 2016-02-06 DIAGNOSIS — Z23 Encounter for immunization: Secondary | ICD-10-CM | POA: Diagnosis not present

## 2016-03-19 ENCOUNTER — Encounter (INDEPENDENT_AMBULATORY_CARE_PROVIDER_SITE_OTHER): Payer: Self-pay

## 2016-03-19 ENCOUNTER — Encounter: Payer: Self-pay | Admitting: Cardiology

## 2016-03-19 ENCOUNTER — Ambulatory Visit (INDEPENDENT_AMBULATORY_CARE_PROVIDER_SITE_OTHER): Payer: Commercial Managed Care - HMO | Admitting: Cardiology

## 2016-03-19 VITALS — BP 128/80 | HR 72 | Ht 65.5 in | Wt 152.4 lb

## 2016-03-19 DIAGNOSIS — Z79899 Other long term (current) drug therapy: Secondary | ICD-10-CM | POA: Diagnosis not present

## 2016-03-19 DIAGNOSIS — I480 Paroxysmal atrial fibrillation: Secondary | ICD-10-CM | POA: Diagnosis not present

## 2016-03-19 LAB — CBC
HCT: 41.5 % (ref 35.0–45.0)
HEMOGLOBIN: 13.5 g/dL (ref 11.7–15.5)
MCH: 30.9 pg (ref 27.0–33.0)
MCHC: 32.5 g/dL (ref 32.0–36.0)
MCV: 95 fL (ref 80.0–100.0)
MPV: 9.3 fL (ref 7.5–12.5)
PLATELETS: 354 10*3/uL (ref 140–400)
RBC: 4.37 MIL/uL (ref 3.80–5.10)
RDW: 14.2 % (ref 11.0–15.0)
WBC: 7.6 10*3/uL (ref 3.8–10.8)

## 2016-03-19 NOTE — Patient Instructions (Signed)
Medication Instructions:  The current medical regimen is effective;  continue present plan and medications.  Labwork: Please have blood work today [facility])  Follow-Up: Follow up in 6 months with Dr. Marlou Porch.  You will receive a letter in the mail 2 months before you are due.  Please call us when you receive this letter to schedule your follow up appointment.  If you need a refill on your cardiac medications before your next appointment, please call your pharmacy.  Thank you for choosing [facility]!!

## 2016-03-19 NOTE — Progress Notes (Signed)
[facility] [address], [address] [place], [place]  [address] Phone: [phone] Fax:  [phone]  Date:  03/19/2016   ID:  Tonya Turner, DOB 1940/10/12, MRN [id number]  PCP:  Tonya Shelling, MD   History of Present Illness: Tonya Turner is a 75 y.o. female for paroxysmal atrial fibrillation followup. Very rare episode of AFIB. After surgery she had several episodes but these have subsided. Once she experienced when she was walking with a friend. Has happened when working out at [employer]. She sits down. Drinks water. She is being protected with anticoagulation. She was very diligent about trying to find out the difference in price between oral anticoagulants.  Overall, she feels well. She's not had any stroke, bleeding, chest pain symptoms.  We went over her CHADS-VAS score and it is 2 -female, age greater than 31. Because of this, she deserves anticoagulation. She is taking Xarelto, expensive.  Blood work has been reassuring.  She has done quite well since hip replacement. No chest pain, no syncope, no orthopnea, no bleeding. She does have some easy bruising with Xarelto.  Wt Readings from Last 3 Encounters:  03/19/16 152 lb 6.4 oz (69.1 kg)  10/07/15 151 lb (68.5 kg)  09/30/15 151 lb (68.5 kg)     Past Medical History:  Diagnosis Date  . Arthritis    "hands-mild; left hip" (09/28/2015)  . Atrial fibrillation ([place])   . Basal cell carcinoma of lower leg    "cut out on both shins"  . DDD (degenerative disc disease), lumbar   . Degenerative disc disease, thoracic   . Eczema    history of  . Edema of extremities    usually lower extremities- bilateral  . Family history of adverse reaction to anesthesia    "brother is allergic and gets sick"  . Migraine    rare occular migraine- none recent (09/28/2015)  . PAF (paroxysmal atrial fibrillation) ([place])   . Pneumonia 1940s  . Primary osteoarthritis of left hip   . Unsteady gait   . Varicose vein    hx. of-     Past  Surgical History:  Procedure Laterality Date  . BASAL CELL CARCINOMA EXCISION Bilateral    "shins"  . COLONOSCOPY WITH PROPOFOL N/A 10/28/2012   Procedure: COLONOSCOPY WITH PROPOFOL;  Surgeon: Garlan Fair, MD;  Location: [facility];  Service: Endoscopy;  Laterality: N/A;  . DILATION AND CURETTAGE OF UTERUS    . JOINT REPLACEMENT    . TOTAL HIP ARTHROPLASTY Left 09/27/2015  . TOTAL HIP ARTHROPLASTY Left 09/27/2015   Procedure: TOTAL HIP ARTHROPLASTY ANTERIOR APPROACH;  Surgeon: Melrose Nakayama, MD;  Location: [facility];  Service: Orthopedics;  Laterality: Left;  . TUBAL LIGATION  1970s  . VARICOSE VEIN SURGERY Left 1970s    Current Outpatient Prescriptions  Medication Sig Dispense Refill  . calcium-vitamin D (OSCAL WITH D) 500-200 MG-UNIT per tablet Take 1 tablet by mouth daily.    Marland Kitchen conjugated estrogens (PREMARIN) vaginal cream Place 1 Applicatorful vaginally. 2 times a week    . diltiazem (CARDIZEM) 30 MG tablet Take 1 tablet (30 mg total) by mouth every 6 (six) hours as needed (for rapid heart beat). 120 tablet 10  . diltiazem (CARTIA XT) 180 MG 24 hr capsule Take 1 capsule (180 mg total) by mouth every morning. 90 capsule 1  . glucosamine-chondroitin 500-400 MG tablet Take 0.5 tablets by mouth daily.     . hydrocortisone cream 1 % Apply 1 application  topically 2 (two) times daily. Take for 2 weeks    . loratadine (CLARITIN) 10 MG tablet Take 10 mg by mouth daily. Take 1 qd, stopping on 5/24.  May also take 1 QD PRN, stopping on 7/6.    . multivitamin with minerals (CERTA-VITE) LIQD Take 1 tablet by mouth daily.    . rivaroxaban (XARELTO) 20 MG TABS tablet Take 1 tablet (20 mg total) by mouth daily with supper. 90 tablet 3   No current facility-administered medications for this visit.     Allergies:    Allergies  Allergen Reactions  . Other Rash    Adhesives-skin irritation  . Iodine Solution [Povidone Iodine] Dermatitis    Topical -causes skin inflammation    Social History:   The patient  reports that she quit smoking about 32 years ago. Her smoking use included Cigarettes. She has a 5.00 pack-year smoking history. She has never used smokeless tobacco. She reports that she drinks about 8.4 oz of alcohol per week . She reports that she does not use drugs.   ROS:  Please see the history of present illness.  Positive for ocular migraines, irregular heartbeats, rash, contact dermatitis, easy bruising. Denies any bleeding, syncope, orthopnea, PND    PHYSICAL EXAM: VS:  BP 128/80   Pulse 72   Ht 5' 5.5" (1.664 m)   Wt 152 lb 6.4 oz (69.1 kg)   LMP  (LMP Unknown)   BMI 24.97 kg/m  Well nourished, well developed, in no acute distress  HEENT: normal  Neck: no JVD  Cardiac:  normal S1, S2; RRR; no murmur  Lungs:  clear to auscultation bilaterally, no wheezing, rhonchi or rales  Abd: soft, nontender, no hepatomegaly  Ext: no edema 2+ pulses Skin: warm and dry  Neuro: no focal abnormalities noted  EKG:  Today 03/19/16-sinus rhythm 64 with no other abnormalities personally viewed-prior 03/14/15-sinus rhythm, 67, left atrial enlargement, personally viewed-prior 02/24/14-sinus rhythm, 76, no other abnormalities  ECHO: 1. Normal LV size and function.2. There were no regional wall motion abnormalities.3. Left ventricular ejection fraction estimated by 2D at 60-65 percent. 4. Trace mitral valve regurgitation.5. Trivial tricuspid regurgitation. Labwork reviewed, stable kidney function and hemoglobin  ASSESSMENT AND PLAN:   Atrial fibrillation   - 7 episodes May - June shortly after hip surgery. Now doing very well. Excited. None over past 2 months.  - paroxysmal. Doing very well. No changes. She's had a few brief episodes usually 5 minutes duration. Takes an extra diltiazem when necessary. Continue with current medication. We have added short-acting diltiazem 30 mg by mouth every 6 hours to be taken on as-needed basis if she does have an episode of atrial fibrillation. No  obvious triggers. Interestingly, she does describe a phenomenon prior to her atrial fibrillation episodes, feeling of darkening of her vision, mild dizziness.  Chronic anticoagulation -no changes made to medications. Check lab work every 6 months. Lab work reviewed and reassuring. Creatinine 0.6, hemoglobin 13.5. Labs reviewed. Previously applied for long-term care insurance  We are going to check lab work.  Discussed her medications.  1. Six-month follow-up  Signed, Candee Furbish, MD [facility]  03/19/2016 4:31 PM

## 2016-03-20 LAB — BASIC METABOLIC PANEL
BUN: 10 mg/dL (ref 7–25)
CALCIUM: 9.4 mg/dL (ref 8.6–10.4)
CO2: 27 mmol/L (ref 20–31)
Chloride: 101 mmol/L (ref 98–110)
Creat: 0.69 mg/dL (ref 0.60–0.93)
GLUCOSE: 94 mg/dL (ref 65–99)
POTASSIUM: 4.1 mmol/L (ref 3.5–5.3)
SODIUM: 138 mmol/L (ref 135–146)

## 2016-03-23 ENCOUNTER — Other Ambulatory Visit: Payer: Self-pay | Admitting: Internal Medicine

## 2016-03-23 DIAGNOSIS — Z1231 Encounter for screening mammogram for malignant neoplasm of breast: Secondary | ICD-10-CM

## 2016-03-27 ENCOUNTER — Telehealth: Payer: Self-pay | Admitting: *Deleted

## 2016-03-27 NOTE — Telephone Encounter (Signed)
Pt states she is having a frustrating time ordering a 2nd pair of orthotics.

## 2016-03-28 DIAGNOSIS — M779 Enthesopathy, unspecified: Secondary | ICD-10-CM

## 2016-03-29 ENCOUNTER — Telehealth: Payer: Self-pay | Admitting: Cardiology

## 2016-03-29 NOTE — Telephone Encounter (Signed)
Pt calling requesting samples of Xarelto 20 mg tablets. Pt stated that she is in the donut hole and that she needed some samples. I informed the pt that I was leaving her a 2 weeks supply at the front desk and if she has any other problems or concerns to call our office. Pt verbalized  Understanding. Xarelto 20 mg tablet    LOT# [id number]      EXP: 02-20

## 2016-04-02 DIAGNOSIS — M25552 Pain in left hip: Secondary | ICD-10-CM | POA: Diagnosis not present

## 2016-04-24 DIAGNOSIS — M6281 Muscle weakness (generalized): Secondary | ICD-10-CM | POA: Diagnosis not present

## 2016-04-24 DIAGNOSIS — M25552 Pain in left hip: Secondary | ICD-10-CM | POA: Diagnosis not present

## 2016-04-24 DIAGNOSIS — Z96642 Presence of left artificial hip joint: Secondary | ICD-10-CM | POA: Diagnosis not present

## 2016-04-25 DIAGNOSIS — M778 Other enthesopathies, not elsewhere classified: Secondary | ICD-10-CM | POA: Diagnosis not present

## 2016-04-27 DIAGNOSIS — H2513 Age-related nuclear cataract, bilateral: Secondary | ICD-10-CM | POA: Diagnosis not present

## 2016-04-27 DIAGNOSIS — H52203 Unspecified astigmatism, bilateral: Secondary | ICD-10-CM | POA: Diagnosis not present

## 2016-05-03 DIAGNOSIS — M25552 Pain in left hip: Secondary | ICD-10-CM | POA: Diagnosis not present

## 2016-05-03 DIAGNOSIS — M6281 Muscle weakness (generalized): Secondary | ICD-10-CM | POA: Diagnosis not present

## 2016-05-03 DIAGNOSIS — Z96642 Presence of left artificial hip joint: Secondary | ICD-10-CM | POA: Diagnosis not present

## 2016-05-04 ENCOUNTER — Ambulatory Visit
Admission: RE | Admit: 2016-05-04 | Discharge: 2016-05-04 | Disposition: A | Discharge status: Home / Self Care | Payer: Commercial Managed Care - HMO | Source: Ambulatory Visit | Attending: Internal Medicine | Admitting: Internal Medicine

## 2016-05-04 DIAGNOSIS — Z1231 Encounter for screening mammogram for malignant neoplasm of breast: Secondary | ICD-10-CM

## 2016-05-08 DIAGNOSIS — M6281 Muscle weakness (generalized): Secondary | ICD-10-CM | POA: Diagnosis not present

## 2016-05-08 DIAGNOSIS — Z96642 Presence of left artificial hip joint: Secondary | ICD-10-CM | POA: Diagnosis not present

## 2016-05-08 DIAGNOSIS — M25552 Pain in left hip: Secondary | ICD-10-CM | POA: Diagnosis not present

## 2016-05-10 DIAGNOSIS — M6281 Muscle weakness (generalized): Secondary | ICD-10-CM | POA: Diagnosis not present

## 2016-05-10 DIAGNOSIS — M25552 Pain in left hip: Secondary | ICD-10-CM | POA: Diagnosis not present

## 2016-05-10 DIAGNOSIS — Z96642 Presence of left artificial hip joint: Secondary | ICD-10-CM | POA: Diagnosis not present

## 2016-05-14 DIAGNOSIS — Z96642 Presence of left artificial hip joint: Secondary | ICD-10-CM | POA: Diagnosis not present

## 2016-05-14 DIAGNOSIS — M6281 Muscle weakness (generalized): Secondary | ICD-10-CM | POA: Diagnosis not present

## 2016-05-14 DIAGNOSIS — M25552 Pain in left hip: Secondary | ICD-10-CM | POA: Diagnosis not present

## 2016-05-17 DIAGNOSIS — M6281 Muscle weakness (generalized): Secondary | ICD-10-CM | POA: Diagnosis not present

## 2016-05-17 DIAGNOSIS — M25552 Pain in left hip: Secondary | ICD-10-CM | POA: Diagnosis not present

## 2016-05-17 DIAGNOSIS — Z96642 Presence of left artificial hip joint: Secondary | ICD-10-CM | POA: Diagnosis not present

## 2016-05-22 ENCOUNTER — Encounter (HOSPITAL_COMMUNITY): Payer: Self-pay | Admitting: Emergency Medicine

## 2016-05-22 ENCOUNTER — Emergency Department (HOSPITAL_COMMUNITY): Payer: Commercial Managed Care - HMO

## 2016-05-22 ENCOUNTER — Observation Stay (HOSPITAL_COMMUNITY)
Admission: EM | Admit: 2016-05-22 | Discharge: 2016-05-24 | Disposition: A | Discharge status: Home / Self Care | Payer: Commercial Managed Care - HMO | Attending: Family Medicine | Admitting: Family Medicine

## 2016-05-22 DIAGNOSIS — Z87891 Personal history of nicotine dependence: Secondary | ICD-10-CM | POA: Insufficient documentation

## 2016-05-22 DIAGNOSIS — R2681 Unsteadiness on feet: Secondary | ICD-10-CM | POA: Insufficient documentation

## 2016-05-22 DIAGNOSIS — W19XXXA Unspecified fall, initial encounter: Secondary | ICD-10-CM | POA: Diagnosis not present

## 2016-05-22 DIAGNOSIS — I480 Paroxysmal atrial fibrillation: Secondary | ICD-10-CM | POA: Diagnosis present

## 2016-05-22 DIAGNOSIS — Z96642 Presence of left artificial hip joint: Secondary | ICD-10-CM | POA: Diagnosis not present

## 2016-05-22 DIAGNOSIS — Z883 Allergy status to other anti-infective agents status: Secondary | ICD-10-CM | POA: Insufficient documentation

## 2016-05-22 DIAGNOSIS — M1612 Unilateral primary osteoarthritis, left hip: Secondary | ICD-10-CM | POA: Diagnosis not present

## 2016-05-22 DIAGNOSIS — Z833 Family history of diabetes mellitus: Secondary | ICD-10-CM | POA: Insufficient documentation

## 2016-05-22 DIAGNOSIS — G43909 Migraine, unspecified, not intractable, without status migrainosus: Secondary | ICD-10-CM | POA: Diagnosis not present

## 2016-05-22 DIAGNOSIS — S069X9A Unspecified intracranial injury with loss of consciousness of unspecified duration, initial encounter: Secondary | ICD-10-CM | POA: Insufficient documentation

## 2016-05-22 DIAGNOSIS — M5136 Other intervertebral disc degeneration, lumbar region: Secondary | ICD-10-CM | POA: Diagnosis not present

## 2016-05-22 DIAGNOSIS — Z7901 Long term (current) use of anticoagulants: Secondary | ICD-10-CM | POA: Diagnosis not present

## 2016-05-22 DIAGNOSIS — Z8249 Family history of ischemic heart disease and other diseases of the circulatory system: Secondary | ICD-10-CM | POA: Insufficient documentation

## 2016-05-22 DIAGNOSIS — Z85828 Personal history of other malignant neoplasm of skin: Secondary | ICD-10-CM | POA: Insufficient documentation

## 2016-05-22 DIAGNOSIS — R531 Weakness: Secondary | ICD-10-CM | POA: Diagnosis not present

## 2016-05-22 DIAGNOSIS — Z780 Asymptomatic menopausal state: Secondary | ICD-10-CM | POA: Insufficient documentation

## 2016-05-22 DIAGNOSIS — R000 Tachycardia, unspecified: Secondary | ICD-10-CM | POA: Diagnosis not present

## 2016-05-22 DIAGNOSIS — I471 Supraventricular tachycardia: Secondary | ICD-10-CM

## 2016-05-22 DIAGNOSIS — R55 Syncope and collapse: Principal | ICD-10-CM | POA: Diagnosis present

## 2016-05-22 LAB — CBC WITH DIFFERENTIAL/PLATELET
Basophils Absolute: 0.1 10*3/uL (ref 0.0–0.1)
Basophils Relative: 1 %
Eosinophils Absolute: 0.1 10*3/uL (ref 0.0–0.7)
Eosinophils Relative: 1 %
HEMATOCRIT: 45 % (ref 36.0–46.0)
Hemoglobin: 15 g/dL (ref 12.0–15.0)
Lymphocytes Relative: 30 %
Lymphs Abs: 2.4 10*3/uL (ref 0.7–4.0)
MCH: 31.8 pg (ref 26.0–34.0)
MCHC: 33.3 g/dL (ref 30.0–36.0)
MCV: 95.5 fL (ref 78.0–100.0)
MONO ABS: 0.4 10*3/uL (ref 0.1–1.0)
MONOS PCT: 5 %
NEUTROS ABS: 5 10*3/uL (ref 1.7–7.7)
NEUTROS PCT: 63 %
Platelets: 306 10*3/uL (ref 150–400)
RBC: 4.71 MIL/uL (ref 3.87–5.11)
RDW: 13.2 % (ref 11.5–15.5)
WBC: 8 10*3/uL (ref 4.0–10.5)

## 2016-05-22 LAB — MAGNESIUM: MAGNESIUM: 2 mg/dL (ref 1.7–2.4)

## 2016-05-22 LAB — COMPREHENSIVE METABOLIC PANEL
ALBUMIN: 4.7 g/dL (ref 3.5–5.0)
ALK PHOS: 51 U/L (ref 38–126)
ALT: 20 U/L (ref 14–54)
AST: 25 U/L (ref 15–41)
Anion gap: 11 (ref 5–15)
BILIRUBIN TOTAL: 0.6 mg/dL (ref 0.3–1.2)
BUN: 6 mg/dL (ref 6–20)
CALCIUM: 9.7 mg/dL (ref 8.9–10.3)
CO2: 25 mmol/L (ref 22–32)
Chloride: 101 mmol/L (ref 101–111)
Creatinine, Ser: 0.75 mg/dL (ref 0.44–1.00)
GFR calc Af Amer: >60 mL/min (ref >60)
GLUCOSE: 101 mg/dL — AB (ref 65–99)
POTASSIUM: 3.9 mmol/L (ref 3.5–5.1)
Sodium: 137 mmol/L (ref 135–145)
TOTAL PROTEIN: 7.5 g/dL (ref 6.5–8.1)

## 2016-05-22 LAB — I-STAT TROPONIN, ED: TROPONIN I, POC: 0 ng/mL (ref 0.00–0.08)

## 2016-05-22 LAB — BRAIN NATRIURETIC PEPTIDE: B Natriuretic Peptide: 31.7 pg/mL (ref 0.0–100.0)

## 2016-05-22 LAB — PHOSPHORUS: Phosphorus: 3 mg/dL (ref 2.5–4.6)

## 2016-05-22 MED ORDER — RIVAROXABAN 20 MG PO TABS
20.0000 mg | ORAL_TABLET | Freq: Every day | ORAL | Status: DC
  Administered 2016-05-23 (×2): 20 mg via ORAL
  Filled 2016-05-22 (×2): qty 1

## 2016-05-22 MED ORDER — LORATADINE 10 MG PO TABS
10.0000 mg | ORAL_TABLET | Freq: Every day | ORAL | Status: DC
  Administered 2016-05-23: 10 mg via ORAL
  Filled 2016-05-22 (×2): qty 1

## 2016-05-22 MED ORDER — GLUCOSAMINE-CHONDROITIN 500-400 MG PO TABS: 0.5000 | ORAL_TABLET | Freq: Every day | ORAL | Status: DC

## 2016-05-22 MED ORDER — ACETAMINOPHEN 650 MG RE SUPP: 650.0000 mg | Freq: Four times a day (QID) | RECTAL | Status: DC | PRN

## 2016-05-22 MED ORDER — ACETAMINOPHEN 325 MG PO TABS: 650.0000 mg | ORAL_TABLET | Freq: Four times a day (QID) | ORAL | Status: DC | PRN

## 2016-05-22 MED ORDER — SODIUM CHLORIDE 0.9 % IV SOLN
INTRAVENOUS | Status: DC
  Administered 2016-05-23: via INTRAVENOUS

## 2016-05-22 MED ORDER — CALCIUM CARBONATE-VITAMIN D 500-200 MG-UNIT PO TABS
1.0000 | ORAL_TABLET | Freq: Every day | ORAL | Status: DC
  Administered 2016-05-23 – 2016-05-24 (×2): 1 via ORAL
  Filled 2016-05-22 (×2): qty 1

## 2016-05-22 MED ORDER — ONDANSETRON HCL 4 MG/2ML IJ SOLN: 4.0000 mg | Freq: Four times a day (QID) | INTRAMUSCULAR | Status: DC | PRN

## 2016-05-22 MED ORDER — DILTIAZEM HCL ER COATED BEADS 180 MG PO CP24
180.0000 mg | ORAL_CAPSULE | Freq: Every morning | ORAL | Status: DC
  Administered 2016-05-23: 180 mg via ORAL
  Filled 2016-05-22: qty 1

## 2016-05-22 MED ORDER — ONDANSETRON HCL 4 MG PO TABS: 4.0000 mg | ORAL_TABLET | Freq: Four times a day (QID) | ORAL | Status: DC | PRN

## 2016-05-22 NOTE — H&P (Signed)
History and Physical    Tonya Turner [id number] DOB: Apr 11, 1941 DOA: 05/22/2016  PCP: Irven Shelling, MD  Patient coming from: Home.  Chief Complaint: Loss of consciousness.  HPI: Tonya Turner is a 75 y.o. female with history of paroxysmal atrial fibrillation on Cardizem and xarelto was brought to the ER after patient had brief episode of loss of consciousness while working out in a gym. Patient states last evening patient was working out in the gym when patient started experiencing palpitations. Patient thought about taking her Cardizem by mouth patient suddenly lost consciousness and was on the floor. Patient states she did not have any incontinence of urine or bowel. Denies any associated chest pain or shortness of breath. On exam patient was nonfocal denies any headache. CT head was unremarkable.  As per the ER physician at the site EMS recorded heart rate around more than 200 bpm with monitor showing SVT. Patient's heart rate reverted back to sinus rhythm without any intervention. I do not have access to the EMS strip. EKG in the ER shows normal sinus rhythm.   ED Course: Chest x-ray and EKG were unremarkable. CT head was done which was showing nothing acute.  Review of Systems: As per HPI, rest all negative.   Past Medical History:  Diagnosis Date  . Arthritis    "hands-mild; left hip" (09/28/2015)  . Atrial fibrillation ([place])   . Basal cell carcinoma of lower leg    "cut out on both shins"  . DDD (degenerative disc disease), lumbar   . Degenerative disc disease, thoracic   . Eczema    history of  . Edema of extremities    usually lower extremities- bilateral  . Family history of adverse reaction to anesthesia    "brother is allergic and gets sick"  . Migraine    rare occular migraine- none recent (09/28/2015)  . PAF (paroxysmal atrial fibrillation) ([place])   . Pneumonia 1940s  . Primary osteoarthritis of left hip   . Unsteady gait   . Varicose vein    hx. of-      Past Surgical History:  Procedure Laterality Date  . BASAL CELL CARCINOMA EXCISION Bilateral    "shins"  . COLONOSCOPY WITH PROPOFOL N/A 10/28/2012   Procedure: COLONOSCOPY WITH PROPOFOL;  Surgeon: Garlan Fair, MD;  Location: [facility];  Service: Endoscopy;  Laterality: N/A;  . DILATION AND CURETTAGE OF UTERUS    . JOINT REPLACEMENT    . TOTAL HIP ARTHROPLASTY Left 09/27/2015  . TOTAL HIP ARTHROPLASTY Left 09/27/2015   Procedure: TOTAL HIP ARTHROPLASTY ANTERIOR APPROACH;  Surgeon: Melrose Nakayama, MD;  Location: [facility];  Service: Orthopedics;  Laterality: Left;  . TUBAL LIGATION  1970s  . VARICOSE VEIN SURGERY Left 1970s     reports that she quit smoking about 32 years ago. Her smoking use included Cigarettes. She has a 5.00 pack-year smoking history. She has never used smokeless tobacco. She reports that she drinks about 8.4 oz of alcohol per week . She reports that she does not use drugs.  Allergies  Allergen Reactions  . Other Rash    Adhesives-skin irritation  . Iodine Solution [Povidone Iodine] Dermatitis    Topical -causes skin inflammation    Family History  Problem Relation Age of Onset  . Diabetes type II Brother   . Hypertension Brother   . Stroke Neg Hx     Prior to Admission medications   Medication Sig Start Date End Date Taking? Authorizing Provider  calcium-vitamin D (OSCAL WITH D) 500-200 MG-UNIT per tablet Take 1 tablet by mouth daily.   Yes Historical Provider, MD  conjugated estrogens (PREMARIN) vaginal cream Place 1 Applicatorful vaginally once a week. No specific day   Yes Historical Provider, MD  diltiazem (CARDIZEM) 30 MG tablet Take 1 tablet (30 mg total) by mouth every 6 (six) hours as needed (for rapid heart beat). 11/01/15  Yes Jerline Pain, MD  diltiazem (CARTIA XT) 180 MG 24 hr capsule Take 1 capsule (180 mg total) by mouth every morning. 02/02/16  Yes Jerline Pain, MD  glucosamine-chondroitin 500-400 MG tablet Take 0.5 tablets by mouth daily.     Yes Historical Provider, MD  loratadine (CLARITIN) 10 MG tablet Take 10 mg by mouth daily. Take 1 qd, stopping on 5/24.  May also take 1 QD PRN, stopping on 7/6.   Yes Historical Provider, MD  multivitamin with minerals (CERTA-VITE) LIQD Take 1 tablet by mouth daily. 01/05/16  Yes Historical Provider, MD  rivaroxaban (XARELTO) 20 MG TABS tablet Take 1 tablet (20 mg total) by mouth daily with supper. 10/27/15  Yes Jerline Pain, MD  triamcinolone cream (KENALOG) 0.1 % Apply 1 application topically daily as needed. For rash and itching   Yes Historical Provider, MD    Physical Exam: Vitals:   05/22/16 2030 05/22/16 2130 05/22/16 2200 05/22/16 2300  BP: 125/90 (!) 108/53 114/69 119/69  Pulse: 77 67 78 74  Resp: 22 14 13 18   Temp:    98.2 F (36.8 C)  TempSrc:    Oral  SpO2: 99% 100% 97% 100%  Weight:    68.2 kg (150 lb 4.8 oz)  Height:    5\' 5"  (1.651 m)      Constitutional: Moderately built and nourished. Vitals:   05/22/16 2030 05/22/16 2130 05/22/16 2200 05/22/16 2300  BP: 125/90 (!) 108/53 114/69 119/69  Pulse: 77 67 78 74  Resp: 22 14 13 18   Temp:    98.2 F (36.8 C)  TempSrc:    Oral  SpO2: 99% 100% 97% 100%  Weight:    68.2 kg (150 lb 4.8 oz)  Height:    5\' 5"  (1.651 m)   Eyes: Anicteric no pallor. ENMT: No discharge from the ears eyes nose or mouth. Neck: No mass felt. No JVD appreciated. Respiratory: No rhonchi or crepitations. Cardiovascular: S1-S2 heard. No murmurs appreciated. Abdomen: Soft nontender bowel sounds present. No guarding or rigidity. Musculoskeletal: No edema. No joint effusion. Skin: Appears warm. No rash. Neurologic: Alert awake oriented to time place and person. Moves all extremities 5 x 5. Psychiatric: Appears normal. Normal affect.   Labs on Admission: I have personally reviewed following labs and imaging studies  CBC:  Recent Labs Lab 05/22/16 1925  WBC 8.0  NEUTROABS 5.0  HGB 15.0  HCT 45.0  MCV 95.5  PLT [id number]   Basic Metabolic  Panel:  Recent Labs Lab 05/22/16 1925  NA 137  K 3.9  CL 101  CO2 25  GLUCOSE 101*  BUN 6  CREATININE 0.75  CALCIUM 9.7  MG 2.0  PHOS 3.0   GFR: Estimated Creatinine Clearance: 53.8 mL/min (by C-G formula based on SCr of 0.75 mg/dL). Liver Function Tests:  Recent Labs Lab 05/22/16 1925  AST 25  ALT 20  ALKPHOS 51  BILITOT 0.6  PROT 7.5  ALBUMIN 4.7   No results for input(s): LIPASE, AMYLASE in the last 168 hours. No results for input(s): AMMONIA in the last 168 hours.  Coagulation Profile: No results for input(s): INR, PROTIME in the last 168 hours. Cardiac Enzymes: No results for input(s): CKTOTAL, CKMB, CKMBINDEX, TROPONINI in the last 168 hours. BNP (last 3 results) No results for input(s): PROBNP in the last 8760 hours. HbA1C: No results for input(s): HGBA1C in the last 72 hours. CBG: No results for input(s): GLUCAP in the last 168 hours. Lipid Profile: No results for input(s): CHOL, HDL, LDLCALC, TRIG, CHOLHDL, LDLDIRECT in the last 72 hours. Thyroid Function Tests: No results for input(s): TSH, T4TOTAL, FREET4, T3FREE, THYROIDAB in the last 72 hours. Anemia Panel: No results for input(s): VITAMINB12, FOLATE, FERRITIN, TIBC, IRON, RETICCTPCT in the last 72 hours. Urine analysis:    Component Value Date/Time   COLORURINE YELLOW 09/21/2015 [place] 09/21/2015 1454   LABSPEC 1.010 09/21/2015 1454   PHURINE 7.0 09/21/2015 1454   GLUCOSEU NEGATIVE 09/21/2015 1454   HGBUR NEGATIVE 09/21/2015 1454   BILIRUBINUR NEGATIVE 09/21/2015 1454   KETONESUR NEGATIVE 09/21/2015 1454   PROTEINUR NEGATIVE 09/21/2015 1454   NITRITE NEGATIVE 09/21/2015 1454   LEUKOCYTESUR NEGATIVE 09/21/2015 1454   Sepsis Labs: @LABRCNTIP (procalcitonin:4,lacticidven:4) )No results found for this or any previous visit (from the past 240 hour(s)).   Radiological Exams on Admission: Ct Head Wo Contrast  Result Date: 05/22/2016 CLINICAL DATA:  Fall with loss of  consciousness. Struck head on the floor. On anticoagulation. EXAM: CT HEAD WITHOUT CONTRAST TECHNIQUE: Contiguous axial images were obtained from the base of the skull through the vertex without intravenous contrast. COMPARISON:  None. FINDINGS: Brain: No evidence of acute infarction, hemorrhage, hydrocephalus, extra-axial collection or mass lesion/mass effect. Mild generalized atrophy and chronic small vessel ischemia, normal for age. Tiny lacunar infarct in the left basal ganglia versus prominent perivascular space. Vascular: No hyperdense vessel or unexpected calcification. Skull: Normal. Negative for fracture or focal lesion. Sinuses/Orbits: Paranasal sinuses and mastoid air cells are clear. The visualized orbits are unremarkable. Other: None. IMPRESSION: No acute intracranial abnormality. Electronically Signed   By: Jeb Levering M.D.   On: 05/22/2016 21:16   Dg Chest Portable 1 View  Result Date: 05/22/2016 CLINICAL DATA:  Syncope and palpitations EXAM: PORTABLE CHEST 1 VIEW COMPARISON:  None FINDINGS: The heart size and mediastinal contours are within normal limits. Both lungs are clear. The visualized skeletal structures are unremarkable. IMPRESSION: No active disease. Electronically Signed   By: Kerby Moors M.D.   On: 05/22/2016 20:08    EKG: Independently reviewed. Normal sinus rhythm with QRS of 79 ms and QTC of 436 ms.  Assessment/Plan Principal Problem:   Syncope Active Problems:   PAF (paroxysmal atrial fibrillation) ([facility])    1. Syncope with SVT - will closely observe in telemetry. I have requested cardiology consult. Check 2-D echo TSH. 2. Paroxysmal atrial fibrillation - chads 2 vasc score of 2 patient is on xarelto. Continue Cardizem.   DVT prophylaxis: Xarelto. Code Status: Full code.  Family Communication: Discussed with patient.  Disposition Plan: Home.  Consults called: Cardiology.  Admission status: Observation.    Rise Patience MD [facility] Pager [phone].  If 7PM-7AM, please contact night-coverage [email or web address] Password [facility]  05/22/2016, 11:53 PM

## 2016-05-22 NOTE — ED Provider Notes (Signed)
[facility] DEPT Provider Note   CSN: [id number] Arrival date & time: 05/22/16  1824     History   Chief Complaint Chief Complaint  Patient presents with  . Tachycardia    HPI Tonya Turner is a 75 y.o. female.  HPI   Patient is a 75 year old female with a past medical history of atrial fibrillation currently on Xarelto who presents today with syncope that occurred merely prior to arrival. Patient arrives via EMS. Patient had just finished working out at the gym when she was stretching and had an episode of syncope. Endorses tunnel vision right before she collapsed. States that she was out for several seconds. Immediately took diltiazem 30 mg by mouth upon waking. EMS relates that the patient had a narrow regular tachycardia on rhythm strip on their arrival. Rate on my review the strip is approximately 197 bpm. This was all spontaneously without any intervention. Patient denies any chest pain associated with this. States that she feels nearly back to baseline. Denies any recent fevers, chills, nausea, vomiting, diarrhea, chest pain, shortness of breath. Denies any prior similar symptoms.  Past Medical History:  Diagnosis Date  . Arthritis    "hands-mild; left hip" (09/28/2015)  . Atrial fibrillation ([place])   . Basal cell carcinoma of lower leg    "cut out on both shins"  . DDD (degenerative disc disease), lumbar   . Degenerative disc disease, thoracic   . Eczema    history of  . Edema of extremities    usually lower extremities- bilateral  . Family history of adverse reaction to anesthesia    "brother is allergic and gets sick"  . Migraine    rare occular migraine- none recent (09/28/2015)  . PAF (paroxysmal atrial fibrillation) ([place])   . Pneumonia 1940s  . Primary osteoarthritis of left hip   . Unsteady gait   . Varicose vein    hx. of-     Patient Active Problem List   Diagnosis Date Noted  . Syncope 05/22/2016  . Primary osteoarthritis of left hip 09/27/2015  .  Medication management 08/25/2014  . PAF (paroxysmal atrial fibrillation) ([place]) 08/19/2013  . Chronic anticoagulation 08/19/2013  . Dysrhythmia   . Varicose vein   . Edema of extremities   . Cancer ([place])   . Arthritis     Past Surgical History:  Procedure Laterality Date  . BASAL CELL CARCINOMA EXCISION Bilateral    "shins"  . COLONOSCOPY WITH PROPOFOL N/A 10/28/2012   Procedure: COLONOSCOPY WITH PROPOFOL;  Surgeon: Garlan Fair, MD;  Location: [facility];  Service: Endoscopy;  Laterality: N/A;  . DILATION AND CURETTAGE OF UTERUS    . JOINT REPLACEMENT    . TOTAL HIP ARTHROPLASTY Left 09/27/2015  . TOTAL HIP ARTHROPLASTY Left 09/27/2015   Procedure: TOTAL HIP ARTHROPLASTY ANTERIOR APPROACH;  Surgeon: Melrose Nakayama, MD;  Location: [facility];  Service: Orthopedics;  Laterality: Left;  . TUBAL LIGATION  1970s  . VARICOSE VEIN SURGERY Left 1970s    OB History    No data available       Home Medications    Prior to Admission medications   Medication Sig Start Date End Date Taking? Authorizing Provider  calcium-vitamin D (OSCAL WITH D) 500-200 MG-UNIT per tablet Take 1 tablet by mouth daily.   Yes Historical Provider, MD  conjugated estrogens (PREMARIN) vaginal cream Place 1 Applicatorful vaginally once a week. No specific day   Yes Historical Provider, MD  diltiazem (CARDIZEM) 30 MG tablet Take 1  tablet (30 mg total) by mouth every 6 (six) hours as needed (for rapid heart beat). 11/01/15  Yes Jerline Pain, MD  diltiazem (CARTIA XT) 180 MG 24 hr capsule Take 1 capsule (180 mg total) by mouth every morning. 02/02/16  Yes Jerline Pain, MD  glucosamine-chondroitin 500-400 MG tablet Take 0.5 tablets by mouth daily.    Yes Historical Provider, MD  loratadine (CLARITIN) 10 MG tablet Take 10 mg by mouth daily. Take 1 qd, stopping on 5/24.  May also take 1 QD PRN, stopping on 7/6.   Yes Historical Provider, MD  multivitamin with minerals (CERTA-VITE) LIQD Take 1 tablet by mouth daily. 01/05/16   Yes Historical Provider, MD  rivaroxaban (XARELTO) 20 MG TABS tablet Take 1 tablet (20 mg total) by mouth daily with supper. 10/27/15  Yes Jerline Pain, MD  triamcinolone cream (KENALOG) 0.1 % Apply 1 application topically daily as needed. For rash and itching   Yes Historical Provider, MD    Family History No family history on file.  Social History Social History  Substance Use Topics  . Smoking status: Former Smoker    Packs/day: 0.25    Years: 20.00    Types: Cigarettes    Quit date: 10/01/1983  . Smokeless tobacco: Never Used  . Alcohol use 8.4 oz/week    14 Glasses of wine per week     Allergies   Other and Iodine solution [povidone iodine]   Review of Systems Review of Systems  Constitutional: Negative for chills and fever.  HENT: Negative for ear pain and sore throat.   Respiratory: Negative for cough and shortness of breath.   Cardiovascular: Negative for chest pain and palpitations.  Gastrointestinal: Negative for abdominal pain and vomiting.  Genitourinary: Negative for dysuria and hematuria.  Musculoskeletal: Negative for arthralgias and back pain.  Skin: Negative for color change and rash.  Neurological: Positive for syncope and light-headedness (prior with tunnel vision). Negative for seizures, weakness and numbness.  Psychiatric/Behavioral: Negative for agitation and confusion.  All other systems reviewed and are negative.    Physical Exam Updated Vital Signs BP 119/69 (BP Location: Left Arm)   Pulse 74   Temp 98.2 F (36.8 C) (Oral)   Resp 18   Ht 5\' 5"  (1.651 m)   Wt 68.2 kg   LMP  (LMP Unknown)   SpO2 100%   BMI 25.01 kg/m   Physical Exam  Constitutional: She is oriented to person, place, and time. She appears well-developed and well-nourished. No distress.  HENT:  Head: Normocephalic and atraumatic.  Eyes: Conjunctivae and EOM are normal. Pupils are equal, round, and reactive to light.  Neck: Neck supple.  Cardiovascular: Normal rate and  regular rhythm.   No murmur heard. Pulmonary/Chest: Effort normal and breath sounds normal. No respiratory distress.  Abdominal: Soft. There is no tenderness.  Musculoskeletal: Normal range of motion. She exhibits no edema.  Neurological: She is alert and oriented to person, place, and time. She has normal strength. No cranial nerve deficit (CN II - XII intact) or sensory deficit. GCS eye subscore is 4. GCS verbal subscore is 5. GCS motor subscore is 6.  Skin: Skin is warm and dry.  Psychiatric: She has a normal mood and affect.  Nursing note and vitals reviewed.    ED Treatments / Results  Labs (all labs ordered are listed, but only abnormal results are displayed) Labs Reviewed  COMPREHENSIVE METABOLIC PANEL - Abnormal; Notable for the following:  Result Value   Glucose, Bld 101 (*)    All other components within normal limits  CBC WITH DIFFERENTIAL/PLATELET  MAGNESIUM  PHOSPHORUS  BRAIN NATRIURETIC PEPTIDE  I-STAT TROPOININ, ED    EKG  EKG Interpretation  Date/Time:  Tuesday May 22 2016 18:37:03 EST Ventricular Rate:  79 PR Interval:    QRS Duration: 79 QT Interval:  380 QTC Calculation: 436 R Axis:   73 Text Interpretation:  Sinus rhythm Probable left atrial enlargement Normal ECG Confirmed by Carmin Muskrat  MD ([id number]) on 05/22/2016 7:24:02 PM       Radiology Ct Head Wo Contrast  Result Date: 05/22/2016 CLINICAL DATA:  Fall with loss of consciousness. Struck head on the floor. On anticoagulation. EXAM: CT HEAD WITHOUT CONTRAST TECHNIQUE: Contiguous axial images were obtained from the base of the skull through the vertex without intravenous contrast. COMPARISON:  None. FINDINGS: Brain: No evidence of acute infarction, hemorrhage, hydrocephalus, extra-axial collection or mass lesion/mass effect. Mild generalized atrophy and chronic small vessel ischemia, normal for age. Tiny lacunar infarct in the left basal ganglia versus prominent perivascular space.  Vascular: No hyperdense vessel or unexpected calcification. Skull: Normal. Negative for fracture or focal lesion. Sinuses/Orbits: Paranasal sinuses and mastoid air cells are clear. The visualized orbits are unremarkable. Other: None. IMPRESSION: No acute intracranial abnormality. Electronically Signed   By: Jeb Levering M.D.   On: 05/22/2016 21:16   Dg Chest Portable 1 View  Result Date: 05/22/2016 CLINICAL DATA:  Syncope and palpitations EXAM: PORTABLE CHEST 1 VIEW COMPARISON:  None FINDINGS: The heart size and mediastinal contours are within normal limits. Both lungs are clear. The visualized skeletal structures are unremarkable. IMPRESSION: No active disease. Electronically Signed   By: Kerby Moors M.D.   On: 05/22/2016 20:08    Procedures Procedures (including critical care time)  Medications Ordered in ED Medications - No data to display   Initial Impression / Assessment and Plan / ED Course  I have reviewed the triage vital signs and the nursing notes.  Pertinent labs & imaging results that were available during my care of the patient were reviewed by me and considered in my medical decision making (see chart for details).  Clinical Course     Patient presents today with syncope that occurred merely prior to arrival. Her EKG here demonstrates normal sinus rhythm without any acute ischemic changes on my review. She has no acute neurological deficits. No prior similar symptoms. Given her age and concerning syncope secondary to likely tachycardia arrhythmia feel that she would be appropriate for overnight telemetry and further workup by inpatient team. Additionally, patient is on anticoagulation and I therefore obtained a CT of her head as she did have a fall and hit her head. This was notably unremarkable.  Discussed with inpatient team who is agreeable to admit the patient. Patient is agreeable with this plan as well. Patient was stable the time of admission. She had no further  tachycardia arrhythmic events here in the emergency department.  Final Clinical Impressions(s) / ED Diagnoses   Final diagnoses:  SVT (supraventricular tachycardia) ([place])  Syncope, unspecified syncope type    New Prescriptions Current Discharge Medication List       Theodosia Quay, MD 05/22/16 2344    Carmin Muskrat, MD 05/23/16 [phone]

## 2016-05-22 NOTE — Progress Notes (Signed)
PHARMACIST - PHYSICIAN ORDER COMMUNICATION  CONCERNING: P&T Medication Policy on Herbal Medications  DESCRIPTION:  This patient's order for:  Glucosamine-chondroitin  has been noted.  This product(s) is classified as an "herbal" or natural product. Due to a lack of definitive safety studies or FDA approval, nonstandard manufacturing practices, plus the potential risk of unknown drug-drug interactions while on inpatient medications, the Pharmacy and Therapeutics Committee does not permit the use of "herbal" or natural products of this type within [facility].   ACTION TAKEN: The pharmacy department is unable to verify this order at this time and your patient has been informed of this safety policy. Please reevaluate patient's clinical condition at discharge and address if the herbal or natural product(s) should be resumed at that time.  Sherlon Handing, PharmD, BCPS 05/22/2016 11:54 PM

## 2016-05-22 NOTE — ED Triage Notes (Signed)
Per EMS, Pt reports being at [employer], doing stretches. She noticed she was going into A-fib, her heart was racing. She lost consciousness and hit her head on the floor. Pt denies chest pain or N/V. Pt took fast acting diltiazem approximately 30 mg. (pt unsure of dose). EMS reports tachycardia of 197 bpm. Pt converted on EMS with no intervention to NSR 93 bpm. Pt is currently stable and in no cute distress.

## 2016-05-22 NOTE — ED Notes (Signed)
Pt given water and eating per Dr Vanita Panda

## 2016-05-22 NOTE — ED Notes (Signed)
Patient undressed, in gown, on monitor, continuous pulse oximetry and blood pressure cuff 

## 2016-05-23 ENCOUNTER — Other Ambulatory Visit: Payer: Self-pay | Admitting: Physician Assistant

## 2016-05-23 ENCOUNTER — Ambulatory Visit (HOSPITAL_BASED_OUTPATIENT_CLINIC_OR_DEPARTMENT_OTHER): Payer: Commercial Managed Care - HMO

## 2016-05-23 DIAGNOSIS — R55 Syncope and collapse: Secondary | ICD-10-CM

## 2016-05-23 DIAGNOSIS — I480 Paroxysmal atrial fibrillation: Secondary | ICD-10-CM | POA: Diagnosis not present

## 2016-05-23 DIAGNOSIS — I471 Supraventricular tachycardia: Secondary | ICD-10-CM

## 2016-05-23 LAB — BASIC METABOLIC PANEL
Anion gap: 8 (ref 5–15)
BUN: 8 mg/dL (ref 6–20)
CALCIUM: 9.6 mg/dL (ref 8.9–10.3)
CHLORIDE: 106 mmol/L (ref 101–111)
CO2: 27 mmol/L (ref 22–32)
CREATININE: 0.72 mg/dL (ref 0.44–1.00)
Glucose, Bld: 120 mg/dL — ABNORMAL HIGH (ref 65–99)
Potassium: 3.9 mmol/L (ref 3.5–5.1)
SODIUM: 141 mmol/L (ref 135–145)

## 2016-05-23 LAB — ECHOCARDIOGRAM COMPLETE
Height: 65 in
WEIGHTICAEL: 2404.8 [oz_av]

## 2016-05-23 LAB — CBC
HCT: 42.5 % (ref 36.0–46.0)
Hemoglobin: 14.1 g/dL (ref 12.0–15.0)
MCH: 31.7 pg (ref 26.0–34.0)
MCHC: 33.2 g/dL (ref 30.0–36.0)
MCV: 95.5 fL (ref 78.0–100.0)
PLATELETS: 299 10*3/uL (ref 150–400)
RBC: 4.45 MIL/uL (ref 3.87–5.11)
RDW: 13.3 % (ref 11.5–15.5)
WBC: 8.4 10*3/uL (ref 4.0–10.5)

## 2016-05-23 LAB — TROPONIN I
TROPONIN I: 0.03 ng/mL — AB (ref <0.03)
Troponin I: <0.03 ng/mL (ref <0.03)

## 2016-05-23 LAB — TSH: TSH: 6.001 u[IU]/mL — AB (ref 0.350–4.500)

## 2016-05-23 MED ORDER — TRIAMCINOLONE ACETONIDE 0.1 % EX LOTN
TOPICAL_LOTION | Freq: Three times a day (TID) | CUTANEOUS | Status: DC
  Administered 2016-05-24: 09:00:00 via TOPICAL
  Filled 2016-05-23: qty 60

## 2016-05-23 NOTE — Care Management Obs Status (Signed)
[facility] NOTIFICATION   Patient Details  Name: Tonya Turner MRN: [id number] Date of Birth: 1940/09/18   Medicare Observation Status Notification Given:  Yes    [name], RN 05/23/2016, 3:39 PM

## 2016-05-23 NOTE — Progress Notes (Signed)
  Echocardiogram 2D Echocardiogram has been performed.  Jennette Dubin 05/23/2016, 2:27 PM

## 2016-05-23 NOTE — Progress Notes (Signed)
CRITICAL VALUE ALERT  Critical value received: troponin 0.03  Date of notification: 05/23/16   Time of notification:  01:09  Critical value read back:Yes.    Nurse who received alert:  Griffin Dakin    MD notified (1st page):  K.Schorr NP   Time of first page: 01:20

## 2016-05-23 NOTE — Progress Notes (Signed)
PROGRESS NOTE    Tonya Turner  [id number] DOB: 1941-02-09 DOA: 05/22/2016 PCP: Irven Shelling, MD    Brief Narrative:   75 y/o ? prior resident [facility] Known P Afib CHad2Vasc2 score=2 on Xarelto S/p L hip arthroplasty 09/2015 Known Eczema Prior normal screening COlonoscopy 2004 in setting diverticulosis  Working out at the gym.episodic syncope while stretching-tunnel vision prior to episode--noted that the patient took Cardizem 30 and was found to have a narrow complex reg tachycardia at rate 197  Cardiology was consulted TSH was 6.00 Echo ordered  Ct head neg cxr no active disease  Rest of labs wnl   Assessment & Plan:   Principal Problem:   Syncope Active Problems:   PAF (paroxysmal atrial fibrillation) ([facility])   Episodic syncope-possible bradycardia causing the same Paroxysmal atrial fib [place] score 2 anticoagulation ? SVT as well Has had extensive discussions in the outpatient with Dr. Sondra Come diagnosis apparently was made based off of a one-time EKG that was done Been placed on when necessary Cardizem 30 mg earlier this year in the office by Dr. Prince Solian She voices concern about falling and hitting her head while on Xarelto which is a very good point Defer to cardiology and agreed that event monitor would be very useful to determine what exactly is going on with her on telemetry today I strips showing some bradycafrdia may benefit from down titration of the dose of Cardizem and/or use of the same more when necessary    diastolic heart failure grade 1 with no acute component Stable I have discontinued IV saline  Female postmenopausal syndrome-continue Premarin cream topically  Stable process continue Os-Cal D     DVT prophylaxis: anticoagulated Code Status: Full Family Communication: none Disposition Plan: home 24 hr   Consultants:  Cardiology  procedures:  Echo [id number] Systolic function was   normal. The estimated ejection fraction was  in the range of 55%   to 60%. Wall motion was normal; there were no regional wall   motion abnormalities. Doppler parameters are consistent with   abnormal left ventricular relaxation (grade 1 diastolic   dysfunction   Antimicrobials:     Subjective:  Alert pleasant oriented no apparent distress Eating drinking chest pain no chills no fever no rigor   subjective: Vitals:   05/22/16 2130 05/22/16 2200 05/22/16 2300 05/23/16 0452  BP: (!) 108/53 114/69 119/69 113/62  Pulse: 67 78 74 70  Resp: 14 13 18 20   Temp:   98.2 F (36.8 C) 98.1 F (36.7 C)  TempSrc:   Oral Oral  SpO2: 100% 97% 100% 97%  Weight:   68.2 kg (150 lb 4.8 oz)   Height:   5\' 5"  (1.651 m)     Intake/Output Summary (Last 24 hours) at 05/23/16 0755 Last data filed at 05/23/16 0730  Gross per 24 hour  Intake           438.75 ml  Output              800 ml  Net          -361.25 ml   Filed Weights   05/22/16 1837 05/22/16 2300  Weight: 68 kg (150 lb) 68.2 kg (150 lb 4.8 oz)    Examination:  General exam: Appears calm and comfortable, Good dentition  Respiratory system: Clear to auscultation. Respiratory effort normal. Cardiovascular system: S1 & S2 heard, RRRoccasional PVC . No JVD Gastrointestinal system: Abdomen is nondistended, soft and nontender. No organomegaly or masses felt. Normal  bowel sounds heard. Central nervous system: Alert and oriented. No focal neurological deficits. Extremities: Symmetric 5 x 5 power. Skin: No rashes, lesions or ulcers Psychiatry: Judgement and insight appear normal. Mood & affect appropriate.     Data Reviewed: I have personally reviewed following labs and imaging studies  CBC:  Recent Labs Lab 05/22/16 1925 05/23/16 0001  WBC 8.0 8.4  NEUTROABS 5.0  --   HGB 15.0 14.1  HCT 45.0 42.5  MCV 95.5 95.5  PLT 306 [id number]   Basic Metabolic Panel:  Recent Labs Lab 05/22/16 1925 05/23/16 0001  NA 137 141  K 3.9 3.9  CL 101 106  CO2 25 27  GLUCOSE 101*  120*  BUN 6 8  CREATININE 0.75 0.72  CALCIUM 9.7 9.6  MG 2.0  --   PHOS 3.0  --    GFR: Estimated Creatinine Clearance: 53.8 mL/min (by C-G formula based on SCr of 0.72 mg/dL). Liver Function Tests:  Recent Labs Lab 05/22/16 1925  AST 25  ALT 20  ALKPHOS 51  BILITOT 0.6  PROT 7.5  ALBUMIN 4.7   No results for input(s): LIPASE, AMYLASE in the last 168 hours. No results for input(s): AMMONIA in the last 168 hours. Coagulation Profile: No results for input(s): INR, PROTIME in the last 168 hours. Cardiac Enzymes:  Recent Labs Lab 05/23/16 0001  TROPONINI 0.03*   BNP (last 3 results) No results for input(s): PROBNP in the last 8760 hours. HbA1C: No results for input(s): HGBA1C in the last 72 hours. CBG: No results for input(s): GLUCAP in the last 168 hours. Lipid Profile: No results for input(s): CHOL, HDL, LDLCALC, TRIG, CHOLHDL, LDLDIRECT in the last 72 hours. Thyroid Function Tests:  Recent Labs  05/23/16 0001  TSH 6.001*   Anemia Panel: No results for input(s): VITAMINB12, FOLATE, FERRITIN, TIBC, IRON, RETICCTPCT in the last 72 hours. Sepsis Labs: No results for input(s): PROCALCITON, LATICACIDVEN in the last 168 hours.  No results found for this or any previous visit (from the past 240 hour(s)).       Radiology Studies: Ct Head Wo Contrast  Result Date: 05/22/2016 CLINICAL DATA:  Fall with loss of consciousness. Struck head on the floor. On anticoagulation. EXAM: CT HEAD WITHOUT CONTRAST TECHNIQUE: Contiguous axial images were obtained from the base of the skull through the vertex without intravenous contrast. COMPARISON:  None. FINDINGS: Brain: No evidence of acute infarction, hemorrhage, hydrocephalus, extra-axial collection or mass lesion/mass effect. Mild generalized atrophy and chronic small vessel ischemia, normal for age. Tiny lacunar infarct in the left basal ganglia versus prominent perivascular space. Vascular: No hyperdense vessel or unexpected  calcification. Skull: Normal. Negative for fracture or focal lesion. Sinuses/Orbits: Paranasal sinuses and mastoid air cells are clear. The visualized orbits are unremarkable. Other: None. IMPRESSION: No acute intracranial abnormality. Electronically Signed   By: Jeb Levering M.D.   On: 05/22/2016 21:16   Dg Chest Portable 1 View  Result Date: 05/22/2016 CLINICAL DATA:  Syncope and palpitations EXAM: PORTABLE CHEST 1 VIEW COMPARISON:  None FINDINGS: The heart size and mediastinal contours are within normal limits. Both lungs are clear. The visualized skeletal structures are unremarkable. IMPRESSION: No active disease. Electronically Signed   By: Kerby Moors M.D.   On: 05/22/2016 20:08        Scheduled Meds: . calcium-vitamin D  1 tablet Oral Daily  . diltiazem  180 mg Oral q morning - 10a  . loratadine  10 mg Oral Daily  . rivaroxaban  20  mg Oral Q supper   Continuous Infusions: . sodium chloride 75 mL/hr at 05/23/16 0009     LOS: 0 days    Time spent: [place], [place], MD [facility] Pager [phone]  If 7PM-7AM, please contact night-coverage [email or web address] Password [facility] 05/23/2016, 7:55 AM

## 2016-05-23 NOTE — Consult Note (Signed)
CARDIOLOGY CONSULT NOTE   Patient ID: Tonya Turner MRN: [id number] DOB/AGE: [birth year] 75 y.o.  Admit date: 05/22/2016  Primary Physician   Irven Shelling, MD Primary Cardiologist   Dr. Marlou Porch Reason for Consultation   SVT/syncope Requesting Physician  Dr. Verlon Au  HPI: Tonya Turner is a 75 y.o. female with a history of PAF on Xarelto who presented by EMS 05/22/16 after syncope.  Hx of presume PAF. No documentations. She had several episodes of pounding sensation presume as afib after her hip surgery May-June 2017. Last episode Aug. Has happened when working out at [employer]. She sits down. Drinks water.  Yesterday at [employer] after work up she was stretching out and felt "typical pounding sensation". She was unable to process what to do and next thing remember to waking out by people. LOS for few seconds. She hit her head. She took her PRN cardizem. EKG strip showed SVT. It's under media "EMS run sheet 05/23/16". She felt back to normal on her way to ER. One episode of SVT overnight on telemetry for 3 beats. No palpations. The patient denies nausea, vomiting, fever, chest pain,shortness of breath, orthopnea, PND, dizziness,  cough, congestion, abdominal pain, hematochezia, melena, lower extremity edema.   Past Medical History:  Diagnosis Date  . Arthritis    "hands-mild; left hip" (09/28/2015)  . Atrial fibrillation ([place])   . Basal cell carcinoma of lower leg    "cut out on both shins"  . DDD (degenerative disc disease), lumbar   . Degenerative disc disease, thoracic   . Eczema    history of  . Edema of extremities    usually lower extremities- bilateral  . Family history of adverse reaction to anesthesia    "brother is allergic and gets sick"  . Migraine    rare occular migraine- none recent (09/28/2015)  . PAF (paroxysmal atrial fibrillation) ([place])   . Pneumonia 1940s  . Primary osteoarthritis of left hip   . Unsteady gait   . Varicose vein    hx. of-      Past  Surgical History:  Procedure Laterality Date  . BASAL CELL CARCINOMA EXCISION Bilateral    "shins"  . COLONOSCOPY WITH PROPOFOL N/A 10/28/2012   Procedure: COLONOSCOPY WITH PROPOFOL;  Surgeon: Garlan Fair, MD;  Location: [facility];  Service: Endoscopy;  Laterality: N/A;  . DILATION AND CURETTAGE OF UTERUS    . JOINT REPLACEMENT    . TOTAL HIP ARTHROPLASTY Left 09/27/2015  . TOTAL HIP ARTHROPLASTY Left 09/27/2015   Procedure: TOTAL HIP ARTHROPLASTY ANTERIOR APPROACH;  Surgeon: Melrose Nakayama, MD;  Location: [place] [facility];  Service: Orthopedics;  Laterality: Left;  . TUBAL LIGATION  1970s  . VARICOSE VEIN SURGERY Left 1970s    Allergies  Allergen Reactions  . Other Rash    Adhesives-skin irritation  . Iodine Solution [Povidone Iodine] Dermatitis    Topical -causes skin inflammation    I have reviewed the patient's current medications . calcium-vitamin D  1 tablet Oral Daily  . diltiazem  180 mg Oral q morning - 10a  . loratadine  10 mg Oral Daily  . rivaroxaban  20 mg Oral Q supper   . sodium chloride 75 mL/hr at 05/23/16 0009   acetaminophen **OR** acetaminophen, ondansetron **OR** ondansetron (ZOFRAN) IV  Prior to Admission medications   Medication Sig Start Date End Date Taking? Authorizing Provider  calcium-vitamin D (OSCAL WITH D) 500-200 MG-UNIT per tablet Take 1 tablet by mouth daily.   Yes Historical Provider,  MD  conjugated estrogens (PREMARIN) vaginal cream Place 1 Applicatorful vaginally once a week. No specific day   Yes Historical Provider, MD  diltiazem (CARDIZEM) 30 MG tablet Take 1 tablet (30 mg total) by mouth every 6 (six) hours as needed (for rapid heart beat). 11/01/15  Yes Jerline Pain, MD  diltiazem (CARTIA XT) 180 MG 24 hr capsule Take 1 capsule (180 mg total) by mouth every morning. 02/02/16  Yes Jerline Pain, MD  glucosamine-chondroitin 500-400 MG tablet Take 0.5 tablets by mouth daily.    Yes Historical Provider, MD  loratadine (CLARITIN) 10 MG tablet Take 10  mg by mouth daily. Take 1 qd, stopping on 5/24.  May also take 1 QD PRN, stopping on 7/6.   Yes Historical Provider, MD  multivitamin with minerals (CERTA-VITE) LIQD Take 1 tablet by mouth daily. 01/05/16  Yes Historical Provider, MD  rivaroxaban (XARELTO) 20 MG TABS tablet Take 1 tablet (20 mg total) by mouth daily with supper. 10/27/15  Yes Jerline Pain, MD  triamcinolone cream (KENALOG) 0.1 % Apply 1 application topically daily as needed. For rash and itching   Yes Historical Provider, MD     Social History   Social History  . Marital status: Married    Spouse name: N/A  . Number of children: N/A  . Years of education: N/A   Occupational History  . Not on file.   Social History Main Topics  . Smoking status: Former Smoker    Packs/day: 0.25    Years: 20.00    Types: Cigarettes    Quit date: 10/01/1983  . Smokeless tobacco: Never Used  . Alcohol use 8.4 oz/week    14 Glasses of wine per week  . Drug use: No  . Sexual activity: Not Currently   Other Topics Concern  . Not on file   Social History Narrative  . No narrative on file    Family Status  Relation Status  . Brother   . Neg Hx    Family History  Problem Relation Age of Onset  . Diabetes type II Brother   . Hypertension Brother   . Stroke Neg Hx       ROS:  Full 14 point review of systems complete and found to be negative unless listed above.  Physical Exam: Blood pressure 113/62, pulse 70, temperature 98.1 F (36.7 C), temperature source Oral, resp. rate 20, height 5\' 5"  (1.651 m), weight 150 lb 4.8 oz (68.2 kg), SpO2 97 %.  General: Well developed, well nourished, female in no acute distress Head: Eyes PERRLA, No xanthomas. Normocephalic and atraumatic, oropharynx without edema or exudate.  Lungs: Resp regular and unlabored, CTA. Heart: RRR no s3, s4, or murmurs..   Neck: No carotid bruits. No lymphadenopathy.  JVD. Abdomen: Bowel sounds present, abdomen soft and non-tender without masses or hernias  noted. Msk:  No spine or cva tenderness. No weakness, no joint deformities or effusions. Extremities: No clubbing, cyanosis or edema. DP/PT/Radials 2+ and equal bilaterally. Neuro: Alert and oriented X 3. No focal deficits noted. Psych:  Good affect, responds appropriately Skin: No rashes or lesions noted.  Labs:   Lab Results  Component Value Date   WBC 8.4 05/23/2016   HGB 14.1 05/23/2016   HCT 42.5 05/23/2016   MCV 95.5 05/23/2016   PLT 299 05/23/2016   No results for input(s): INR in the last 72 hours.  Recent Labs Lab 05/22/16 1925 05/23/16 0001  NA 137 141  K 3.9 3.9  CL 101 106  CO2 25 27  BUN 6 8  CREATININE 0.75 0.72  CALCIUM 9.7 9.6  PROT 7.5  --   BILITOT 0.6  --   ALKPHOS 51  --   ALT 20  --   AST 25  --   GLUCOSE 101* 120*  ALBUMIN 4.7  --    Magnesium  Date Value Ref Range Status  05/22/2016 2.0 1.7 - 2.4 mg/dL Final    Recent Labs  05/23/16 0001 05/23/16 0823  TROPONINI 0.03* <0.03    Recent Labs  05/22/16 1956  TROPIPOC 0.00   No results found for: PROBNP No results found for: CHOL, HDL, LDLCALC, TRIG No results found for: DDIMER No results found for: LIPASE, AMYLASE TSH  Date/Time Value Ref Range Status  05/23/2016 12:01 AM 6.001 (H) 0.350 - 4.500 uIU/mL Final    Comment:    Performed by a 3rd Generation assay with a functional sensitivity of <=0.01 uIU/mL.   No results found for: VITAMINB12, FOLATE, FERRITIN, TIBC, IRON, RETICCTPCT  Echo: pending  ECG Sinus rhythm at rate of 79 bpm  Radiology:  Ct Head Wo Contrast  Result Date: 05/22/2016 CLINICAL DATA:  Fall with loss of consciousness. Struck head on the floor. On anticoagulation. EXAM: CT HEAD WITHOUT CONTRAST TECHNIQUE: Contiguous axial images were obtained from the base of the skull through the vertex without intravenous contrast. COMPARISON:  None. FINDINGS: Brain: No evidence of acute infarction, hemorrhage, hydrocephalus, extra-axial collection or mass lesion/mass  effect. Mild generalized atrophy and chronic small vessel ischemia, normal for age. Tiny lacunar infarct in the left basal ganglia versus prominent perivascular space. Vascular: No hyperdense vessel or unexpected calcification. Skull: Normal. Negative for fracture or focal lesion. Sinuses/Orbits: Paranasal sinuses and mastoid air cells are clear. The visualized orbits are unremarkable. Other: None. IMPRESSION: No acute intracranial abnormality. Electronically Signed   By: Jeb Levering M.D.   On: 05/22/2016 21:16   Dg Chest Portable 1 View  Result Date: 05/22/2016 CLINICAL DATA:  Syncope and palpitations EXAM: PORTABLE CHEST 1 VIEW COMPARISON:  None FINDINGS: The heart size and mediastinal contours are within normal limits. Both lungs are clear. The visualized skeletal structures are unremarkable. IMPRESSION: No active disease. Electronically Signed   By: Kerby Moors M.D.   On: 05/22/2016 20:08    ASSESSMENT AND PLAN:     1. Syncope - In setting of SVT. EKG in media "EMS run sheet 05/23/16". She has presume diagnosis of PAF after her hip surgery . Will get event monitor. Head CT normal. Pt will f/u with Dr. Marlou Porch regarding long term anticoagulation. Continue PRN cardizem. Pending echo. TSH elevated.   2. PAF (paroxysmal atrial fibrillation) ([place]) - CHADSVASCs is 2 for age and sex. As above.   3. Abnormal TSH - Per primary  Signed: Bhagat,Bhavinkumar, PA 05/23/2016, 10:43 AM Pager [phone]  Co-Sign MD  Patient examined chart reviewed. Exam benign with clear lungs and no murmur. EMS strip with SVT History of PAF that is poorly documented. But on chronic anticoagulation with CHADVASC 2 Syncope likely related to vagal episode in setting of rapid SVT that broke spontaneously. If echo alright And no further arrhythmias can d/c in am with outpatient event monitor and f/u with Dr Bertram Savin

## 2016-05-24 DIAGNOSIS — I480 Paroxysmal atrial fibrillation: Secondary | ICD-10-CM | POA: Diagnosis not present

## 2016-05-24 DIAGNOSIS — R55 Syncope and collapse: Secondary | ICD-10-CM | POA: Diagnosis not present

## 2016-05-24 DIAGNOSIS — I471 Supraventricular tachycardia: Secondary | ICD-10-CM | POA: Diagnosis not present

## 2016-05-24 MED ORDER — DILTIAZEM HCL ER COATED BEADS 240 MG PO CP24: 240.0000 mg | ORAL_CAPSULE | Freq: Every morning | ORAL | 0 refills | Status: DC

## 2016-05-24 MED ORDER — DILTIAZEM HCL ER COATED BEADS 240 MG PO CP24
240.0000 mg | ORAL_CAPSULE | Freq: Every morning | ORAL | Status: DC
  Administered 2016-05-24: 240 mg via ORAL
  Filled 2016-05-24: qty 1

## 2016-05-24 NOTE — Progress Notes (Signed)
Patient given exit care documents on Supraventricular tachycardia and syncope. Will monitor patient Tonya Turner, Bettina Gavia RN

## 2016-05-24 NOTE — Progress Notes (Signed)
Patient Name: Tonya Turner Date of Encounter: 05/24/2016  Primary Cardiologist: [facility] Problem List     Principal Problem:   Syncope Active Problems:   PAF (paroxysmal atrial fibrillation) ([facility])     Subjective   Feels well, and relating well. No further syncopal episodes. She describes a brief or a, sensation of about to have her arrhythmia prior to her dizzy episodes. This however was associated with fleeting syncope, she says lasted maybe a second or 2. She does not remember being drenched with sweat or diaphoretic. She does not drink water during exercise but she does hydrate previously to exercise.  Inpatient Medications    Scheduled Meds: . calcium-vitamin D  1 tablet Oral Daily  . diltiazem  240 mg Oral q morning - 10a  . loratadine  10 mg Oral Daily  . rivaroxaban  20 mg Oral Q supper  . triamcinolone lotion   Topical TID   Continuous Infusions:  PRN Meds: acetaminophen **OR** acetaminophen, ondansetron **OR** ondansetron (ZOFRAN) IV   Vital Signs    Vitals:   05/23/16 1119 05/23/16 1450 05/23/16 2141 05/24/16 0550  BP: 123/63 126/74 111/69 119/60  Pulse:   63 79  Resp:  19 18 18   Temp:  98.5 F (36.9 C) 98.2 F (36.8 C) 97.8 F (36.6 C)  TempSrc:  Oral Oral Oral  SpO2:  100% 96% 99%  Weight:      Height:        Intake/Output Summary (Last 24 hours) at 05/24/16 0846 Last data filed at 05/23/16 1800  Gross per 24 hour  Intake              240 ml  Output             1300 ml  Net            -1060 ml   Filed Weights   05/22/16 1837 05/22/16 2300  Weight: 150 lb (68 kg) 150 lb 4.8 oz (68.2 kg)    Physical Exam    GEN: Well nourished, well developed, in no acute distress.  HEENT: Grossly normal.  Neck: Supple, no JVD, carotid bruits, or masses. Cardiac: RRR, no murmurs, rubs, or gallops. No clubbing, cyanosis, edema.  Radials/DP/PT 2+ and equal bilaterally.  Respiratory:  Respirations regular and unlabored, clear to auscultation  bilaterally. GI: Soft, nontender, nondistended, BS + x 4. MS: no deformity or atrophy. Skin: warm and dry, no rash. Neuro:  Strength and sensation are intact. Psych: AAOx3.  Normal affect.  Labs    CBC  Recent Labs  05/22/16 1925 05/23/16 0001  WBC 8.0 8.4  NEUTROABS 5.0  --   HGB 15.0 14.1  HCT 45.0 42.5  MCV 95.5 95.5  PLT 306 [id number]   Basic Metabolic Panel  Recent Labs  05/22/16 1925 05/23/16 0001  NA 137 141  K 3.9 3.9  CL 101 106  CO2 25 27  GLUCOSE 101* 120*  BUN 6 8  CREATININE 0.75 0.72  CALCIUM 9.7 9.6  MG 2.0  --   PHOS 3.0  --    Liver Function Tests  Recent Labs  05/22/16 1925  AST 25  ALT 20  ALKPHOS 51  BILITOT 0.6  PROT 7.5  ALBUMIN 4.7   No results for input(s): LIPASE, AMYLASE in the last 72 hours. Cardiac Enzymes  Recent Labs  05/23/16 0823 05/23/16 1244 05/23/16 1801  TROPONINI <0.03 <0.03 <0.03   BNP Invalid input(s): POCBNP D-Dimer No results for input(s):  DDIMER in the last 72 hours. Hemoglobin A1C No results for input(s): HGBA1C in the last 72 hours. Fasting Lipid Panel No results for input(s): CHOL, HDL, LDLCALC, TRIG, CHOLHDL, LDLDIRECT in the last 72 hours. Thyroid Function Tests  Recent Labs  05/23/16 0001  TSH 6.001*    Telemetry    EMS telemetry strip shows 197 bpm retrograde P wave, see media in chart. Telemetry here shows brief paroxysmal atrial tachycardia approximate 20 beats. - Personally Reviewed  ECG    Sinus rhythm - Personally Reviewed  Radiology    Ct Head Wo Contrast  Result Date: 05/22/2016 CLINICAL DATA:  Fall with loss of consciousness. Struck head on the floor. On anticoagulation. EXAM: CT HEAD WITHOUT CONTRAST TECHNIQUE: Contiguous axial images were obtained from the base of the skull through the vertex without intravenous contrast. COMPARISON:  None. FINDINGS: Brain: No evidence of acute infarction, hemorrhage, hydrocephalus, extra-axial collection or mass lesion/mass effect. Mild  generalized atrophy and chronic small vessel ischemia, normal for age. Tiny lacunar infarct in the left basal ganglia versus prominent perivascular space. Vascular: No hyperdense vessel or unexpected calcification. Skull: Normal. Negative for fracture or focal lesion. Sinuses/Orbits: Paranasal sinuses and mastoid air cells are clear. The visualized orbits are unremarkable. Other: None. IMPRESSION: No acute intracranial abnormality. Electronically Signed   By: Jeb Levering M.D.   On: 05/22/2016 21:16   Dg Chest Portable 1 View  Result Date: 05/22/2016 CLINICAL DATA:  Syncope and palpitations EXAM: PORTABLE CHEST 1 VIEW COMPARISON:  None FINDINGS: The heart size and mediastinal contours are within normal limits. Both lungs are clear. The visualized skeletal structures are unremarkable. IMPRESSION: No active disease. Electronically Signed   By: Kerby Moors M.D.   On: 05/22/2016 20:08    Cardiac Studies   ECHO: normal EF  Patient Profile     75 year old with 5-6 year history of PAF (carried over diagnosis from Dr. Laurann Montana) here with Brief syncope in the setting of paroxysmal supraventricular tachycardia captured on media strip EMS which appears to have retrograde P wave suggestive of reentrant tachycardia with heart rate of 197 bpm.  Assessment & Plan    PSVT  - No further reentrant tachycardia recognized on telemetry monitoring throughout her hospital stay.  - I increased her diltiazem from 180-240 mg once a day CD.  - I'm concerned that she had reentrant tachycardia despite attempt at suppression with diltiazem 180 mg CD daily.  - Discussed the possibility of electrophysiology referral with possible EP study and ablative therapy. She requested to see Dr. Rayann Heman possible.  Paroxysmal atrial fibrillation???  - After carefully reviewing her history, she has never worn a monitor in the past. Her sibling had atrial fibrillation and she assumed in the past that her pounding, racing heartbeat  was atrial fibrillation and this was diagnosed previously by Dr. Lavone Orn after she saw his nurse practitioner at [facility] during one of these lengthy episodes. She remembers there being some concern for possible atrial flutter. He was treating her with anticoagulation at the time and at her request she was referred to me to follow her atrial fibrillation. In other words, this diagnosis had been carried over for approximately a year until I saw her.  - Clearly, she has not demonstrated atrial fibrillation here in the hospital. She has had paroxysmal atrial tachycardia on cardiac strips/telemetry here with heart rate of approximate 120 bpm with clear P waves preceding the QRS complexes sometimes buried within the T-wave. Her EMS strip when  she had a brief syncopal episodes while at [employer] clearly demonstrates a PSVT of 197 bpm, a different arrhythmia, with what appears to be retrograde P waves. None of the strips have shown atrial fibrillation.  - We will go ahead and place a 30 day event monitor. If no atrial fibrillation is detected, we both mutually decided that we will did discontinue her anticoagulation at that point understanding that if she does have atrial fibrillation there could be an increased risk of stroke.  - I'm comfortable with her being discharged home.  Signed, Candee Furbish, MD  05/24/2016, 8:46 AM

## 2016-05-24 NOTE — Care Management Note (Signed)
Case Management Note  Patient Details  Name: Tonya Turner MRN: [id number] Date of Birth: December 15, 1940  Subjective/Objective:    Syncope, PAF            Action/Plan: Discharge Planning: AVS reviewed Chart reviewed. No NCM needs identified.   PCP- Lavone Orn MD   Expected Discharge Date: 05/24/2016            Expected Discharge Plan:  Home/Self Care  In-House Referral:  NA  Discharge planning Services  CM Consult  Post Acute Care Choice:  NA Choice offered to:  NA  DME Arranged:  N/A DME Agency:     HH Arranged:  NA HH Agency:  NA  Status of Service:  Completed, signed off  If discussed at [place] of Stay Meetings, dates discussed:    Additional Comments:  Erenest Rasher, RN 05/24/2016, 10:39 AM

## 2016-05-24 NOTE — Progress Notes (Signed)
Reviewed discharge instructions, AVS, medication, follow up appointments with patient and spouse, paper prescription given to patient and also explained that one was also sent to mail order pharmacy . All questions answered.  IV and tele dcd. Will discharge home as ordered . Lorah Kalina, Bettina Gavia RN

## 2016-05-24 NOTE — Discharge Summary (Addendum)
Physician Discharge Summary  Tonya Turner [id number] DOB: [birth year] DOA: 05/22/2016  PCP: Irven Shelling, MD  Admit date: 05/22/2016 Discharge date: 05/24/2016  Time spent: 40 minutes  Recommendations for Outpatient Follow-up:  1. Patient to pickup the event monitor and follow-up with Dr. Marlou Porch as an outpatient 2. To continue Xarelto  Discharge Diagnoses:  Principal Problem:   Syncope Active Problems:   PAF (paroxysmal atrial fibrillation) ([place])   Discharge Condition: Improved  Diet recommendation: Heart healthy  Filed Weights   05/22/16 1837 05/22/16 2300  Weight: 68 kg (150 lb) 68.2 kg (150 lb 4.8 oz)    History of present illness:  75 y/o ? 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 Sondra Come diagnosis apparently was made based off of a one-time EKG that was done at [facility] office Been placed on when necessary Cardizem 30 mg earlier this year in the office by Dr. Marlou Porch Decision making per cardiology was to have patient follow with an event monitor and continue Xarelto cardizem increased this admission from 180--->240 daily on d/c To continue prn 30 mg cardizem   diastolic heart failure grade 1 with no acute component Stable I have discontinued IV saline  Female postmenopausal syndrome-continue Premarin cream topically  Stable process continue Os-Cal D  Procedures:  Echocardiogram    Consultations:  Cardiology  Discharge Exam: Vitals:   05/24/16 0550 05/24/16 1021  BP: 119/60 116/68  Pulse: 79   Resp: 18   Temp: 97.8 F (36.6 C)     General: EOMI NCAT no pallor Cardiovascular: S1-S2 slightly tachycardic 90s but regular Respiratory: Clinically clear no added sound Abdomen soft nontender  Discharge Instructions   Discharge Instructions    Diet - low sodium heart healthy    Complete by:  As directed    Discharge instructions    Complete by:  As directed    Continue your medications without change Please pick up the cardiac monitor Continue your blood thinner Follow up with Dr. Marlou Porch in about 1 month at his office for further care   Increase activity slowly    Complete by:  As directed      Current Discharge Medication List    CONTINUE these medications which have NOT CHANGED   Details  calcium-vitamin D (OSCAL WITH D) 500-200 MG-UNIT per tablet Take 1 tablet by mouth daily.    conjugated estrogens (PREMARIN) vaginal cream Place 1 Applicatorful vaginally once a week. No specific day    diltiazem (CARDIZEM) 30 MG tablet Take 1 tablet (30 mg total) by mouth every 6 (six) hours as needed (for rapid heart beat). Qty: 120 tablet, Refills: 10    diltiazem (CARTIA XT) 180 MG 24 hr capsule Take 1 capsule (180 mg total) by mouth every morning. Qty: 90 capsule, Refills: 1    glucosamine-chondroitin 500-400 MG tablet Take 0.5 tablets by mouth daily.     loratadine (CLARITIN) 10 MG tablet Take 10 mg by mouth daily. Take 1 qd, stopping on 5/24.  May also take  1 QD PRN, stopping on 7/6.    multivitamin with minerals (CERTA-VITE) LIQD Take 1 tablet by mouth daily.    rivaroxaban (XARELTO) 20 MG TABS tablet Take 1 tablet (20 mg total) by mouth daily with supper. Qty: 90 tablet, Refills: 3    triamcinolone cream (KENALOG) 0.1 % Apply 1 application topically daily as needed. For rash and itching       Allergies  Allergen Reactions  . Other Rash     Adhesives-skin irritation  . Iodine Solution [Povidone Iodine] Dermatitis    Topical -causes skin inflammation   Follow-up Information    Candee Furbish, MD. Go on 07/04/2016.   Specialty:  Cardiology Why:  @10 :30 for post hospital  Office will call  appointment time and date for monitor set up.  Contact information: [id number] [address] [place] [address] [phone]            The results of significant diagnostics from this hospitalization (including imaging, microbiology, ancillary and laboratory) are listed below for reference.    Significant Diagnostic Studies: Ct Head Wo Contrast  Result Date: 05/22/2016 CLINICAL DATA:  Fall with loss of consciousness. Struck head on the floor. On anticoagulation. EXAM: CT HEAD WITHOUT CONTRAST TECHNIQUE: Contiguous axial images were obtained from the base of the skull through the vertex without intravenous contrast. COMPARISON:  None. FINDINGS: Brain: No evidence of acute infarction, hemorrhage, hydrocephalus, extra-axial collection or mass lesion/mass effect. Mild generalized atrophy and chronic small vessel ischemia, normal for age. Tiny lacunar infarct in the left basal ganglia versus prominent perivascular space. Vascular: No hyperdense vessel or unexpected calcification. Skull: Normal. Negative for fracture or focal lesion. Sinuses/Orbits: Paranasal sinuses and mastoid air cells are clear. The visualized orbits are unremarkable. Other: None. IMPRESSION: No acute intracranial abnormality. Electronically Signed   By: Jeb Levering M.D.   On: 05/22/2016 21:16   Dg Chest Portable 1 View  Result Date: 05/22/2016 CLINICAL DATA:  Syncope and palpitations EXAM: PORTABLE CHEST 1 VIEW COMPARISON:  None FINDINGS: The heart size and mediastinal contours are within normal limits. Both lungs are clear. The visualized skeletal structures are unremarkable. IMPRESSION: No active disease. Electronically Signed   By: Kerby Moors M.D.   On:  05/22/2016 20:08   Mm Digital Screening Bilateral  Result Date: 05/07/2016 CLINICAL DATA:  Screening. EXAM: DIGITAL SCREENING BILATERAL MAMMOGRAM WITH CAD COMPARISON:  Previous exam(s). ACR Breast Density Category c: The breast tissue is heterogeneously dense, which may obscure small masses. FINDINGS: There are no findings suspicious for malignancy. Images were processed with CAD. IMPRESSION: No mammographic evidence of malignancy. A result letter of this screening mammogram will be mailed directly to the patient. RECOMMENDATION: Screening mammogram in one year. (Code:SM-B-01Y) BI-RADS CATEGORY  1: Negative. Electronically Signed   By: Lovey Newcomer M.D.   On: 05/07/2016 13:36    Microbiology: No results found for this or any previous visit (from the past 240 hour(s)).   Labs: Basic Metabolic Panel:  Recent Labs Lab 05/22/16 1925 05/23/16 0001  NA 137 141  K 3.9 3.9  CL 101 106  CO2 25 27  GLUCOSE 101* 120*  BUN 6 8  CREATININE 0.75 0.72  CALCIUM 9.7 9.6  MG 2.0  --   PHOS 3.0  --    Liver Function Tests:  Recent Labs Lab 05/22/16 1925  AST 25  ALT 20  ALKPHOS 51  BILITOT 0.6  PROT 7.5  ALBUMIN 4.7   No results for input(s): LIPASE, AMYLASE in  the last 168 hours. No results for input(s): AMMONIA in the last 168 hours. CBC:  Recent Labs Lab 05/22/16 1925 05/23/16 0001  WBC 8.0 8.4  NEUTROABS 5.0  --   HGB 15.0 14.1  HCT 45.0 42.5  MCV 95.5 95.5  PLT 306 299   Cardiac Enzymes:  Recent Labs Lab 05/23/16 0001 05/23/16 0823 05/23/16 1244 05/23/16 1801  TROPONINI 0.03* <0.03 <0.03 <0.03   BNP: BNP (last 3 results)  Recent Labs  05/22/16 1925  BNP 31.7    ProBNP (last 3 results) No results for input(s): PROBNP in the last 8760 hours.  CBG: No results for input(s): GLUCAP in the last 168 hours.     SignedNita Sells MD   [facility] 05/24/2016, 10:30 AM

## 2016-05-29 ENCOUNTER — Ambulatory Visit (INDEPENDENT_AMBULATORY_CARE_PROVIDER_SITE_OTHER): Payer: Commercial Managed Care - HMO

## 2016-05-29 DIAGNOSIS — R55 Syncope and collapse: Secondary | ICD-10-CM | POA: Diagnosis not present

## 2016-05-29 DIAGNOSIS — I471 Supraventricular tachycardia: Secondary | ICD-10-CM | POA: Diagnosis not present

## 2016-05-30 ENCOUNTER — Telehealth: Payer: Self-pay | Admitting: Cardiology

## 2016-05-30 NOTE — Telephone Encounter (Signed)
Walk In pt Form-Pt left list of questions for [name]/[name] gave to [name] to take around.

## 2016-05-31 ENCOUNTER — Telehealth: Payer: Self-pay | Admitting: Cardiology

## 2016-05-31 NOTE — Telephone Encounter (Signed)
Pt left a list of questions she has for Dr Marlou Porch and would like them answered as soon as possible.  Advised her he is not in the office until next week.  Advised her I do have her list and will review it with him and call her back next week with further recommendations.  Mainly she is questioning if it is OK to go back to [employer] to work-out (if there are "any conditions or caveats"), is there any type of exercise to avoid.  She is aware I will call back next week once he has reviewed her list of questions.

## 2016-05-31 NOTE — Telephone Encounter (Signed)
Tonya Turner is requesting someone call her in regards to the questions she left at appointment. Questions were given to [facility]. Please call, thanks.

## 2016-06-06 NOTE — Telephone Encounter (Signed)
Per Dr Marlou Porch.  OK to return to [employer] but avoid squats due to possible changes in BP with up and down movement.  Continue sports drinks, OK with any specific exercise. Reviewed with pt who states understanding.  She is very concerned about the need to be cautious doing squats as that is what she has been doing the most of since she had hip surgery.  Advised OK to continue with those as long as she is not having any dizziness or drops in her BP.  Advised to hold onto a bar or countertop and to make sure she is well hydrated.  Advised if dizziness occurs she should stop and sit down.  She reports this is what she has been doing. She would like for me to review with Dr Marlou Porch which I did.  He is in agreement of the above.  Pt is aware.

## 2016-06-07 DIAGNOSIS — Z96642 Presence of left artificial hip joint: Secondary | ICD-10-CM | POA: Diagnosis not present

## 2016-06-07 DIAGNOSIS — M6281 Muscle weakness (generalized): Secondary | ICD-10-CM | POA: Diagnosis not present

## 2016-06-07 DIAGNOSIS — M25552 Pain in left hip: Secondary | ICD-10-CM | POA: Diagnosis not present

## 2016-06-15 ENCOUNTER — Other Ambulatory Visit: Payer: Self-pay | Admitting: *Deleted

## 2016-06-15 MED ORDER — DILTIAZEM HCL ER COATED BEADS 240 MG PO CP24: 240.0000 mg | ORAL_CAPSULE | Freq: Every morning | ORAL | 2 refills | Status: DC

## 2016-06-25 ENCOUNTER — Telehealth: Payer: Self-pay

## 2016-06-25 DIAGNOSIS — I471 Supraventricular tachycardia: Secondary | ICD-10-CM

## 2016-06-25 NOTE — Telephone Encounter (Signed)
Called, spoke with pt. Informed pt Dr. Marlou Porch recommends pt to be seen by EP (Dr. Rayann Heman or Dr. Curt Bears). Pt prefers to see Dr. Rayann Heman. Informed it may take longer to get in with Dr. Rayann Heman. Pt stated that is fine. Informed someone from scheduling will call to schedule pt with Dr. Rayann Heman. Informed to call our office with questions or concerns (if HR sustains or having symptoms). Pt verbalized understanding.

## 2016-06-25 NOTE — Telephone Encounter (Signed)
Received telemerty 4 reports from [employer] for events on 06/23/16 (all bewtween 9:11-9:13 AM CT). Called, spoke with pt. Pt informed the events happened after pt had been working out. Pt stated activity level was a light physical therapy work out. Pt stated she felt an aura prior to event. Pt stated she experienced slight dizziness during events. Pt denied SOB or CP. Pt stated she is doing okay today. I will forward to Dr. Marlou Porch to advise.

## 2016-07-04 ENCOUNTER — Ambulatory Visit (INDEPENDENT_AMBULATORY_CARE_PROVIDER_SITE_OTHER): Payer: Medicare HMO | Admitting: Cardiology

## 2016-07-04 ENCOUNTER — Encounter: Payer: Self-pay | Admitting: Cardiology

## 2016-07-04 ENCOUNTER — Encounter (INDEPENDENT_AMBULATORY_CARE_PROVIDER_SITE_OTHER): Payer: Self-pay

## 2016-07-04 VITALS — BP 118/80 | HR 76 | Ht 65.5 in | Wt 155.0 lb

## 2016-07-04 DIAGNOSIS — I471 Supraventricular tachycardia, unspecified: Secondary | ICD-10-CM

## 2016-07-04 DIAGNOSIS — I480 Paroxysmal atrial fibrillation: Secondary | ICD-10-CM

## 2016-07-04 DIAGNOSIS — Z7901 Long term (current) use of anticoagulants: Secondary | ICD-10-CM | POA: Diagnosis not present

## 2016-07-04 MED ORDER — DILTIAZEM HCL 30 MG PO TABS: 30.0000 mg | ORAL_TABLET | Freq: Four times a day (QID) | ORAL | 10 refills | Status: DC | PRN

## 2016-07-04 NOTE — Patient Instructions (Signed)
Medication Instructions:  Your physician recommends that you continue on your current medications as directed. Please refer to the Current Medication list given to you today.   Labwork: none  Testing/Procedures: none  Follow-Up: Your physician recommends that you schedule a follow-up appointment as needed with Dr Marlou Porch.  See Dr. Rayann Heman next week as planned.     Any Other Special Instructions Will Be Listed Below (If Applicable).     If you need a refill on your cardiac medications before your next appointment, please call your pharmacy.

## 2016-07-04 NOTE — Progress Notes (Signed)
Cardiology Office Note    Date:  07/04/2016   ID:  Tonya Turner, DOB 17-Sep-1940, MRN [id number]  PCP:  Irven Shelling, MD  Cardiologist:   Candee Furbish, MD     History of Present Illness:  Tonya Turner is a 76 y.o. female here for follow-up of PSVT. She was wearing a monitor and had heart rate of 206 bpm. Dizzy. Symptomatic. Referred her to Dr. Rayann Heman who she will be seeing soon for possible ablation. She has carried a diagnosis of paroxysmal atrial fibrillation however her current monitor seems to only demonstrate paroxysmal supraventricular tachycardia. Please see below for discussion but I inherited her as a patient a few years back after she had already been "diagnosed with atrial fibrillation ". I reviewed previous EKGs and do not see any overt evidence of atrial fibrillation. Xarelto.CHADS-VAS score and it is 2 -female, age greater than 64 She has diltiazem to 240 mg once a day She takes diltiazem as needed.  While she was sitting here she said "oh no I am feeling that sensation, aura, and sure enough I auscultated her and she was having SVT once again at approximately 200 bpm. By the time she laid down to get her EKG this had already broken.  Past Medical History:  Diagnosis Date  . Arthritis    "hands-mild; left hip" (09/28/2015)  . Atrial fibrillation ([place])   . Basal cell carcinoma of lower leg    "cut out on both shins"  . DDD (degenerative disc disease), lumbar   . Degenerative disc disease, thoracic   . Eczema    history of  . Edema of extremities    usually lower extremities- bilateral  . Family history of adverse reaction to anesthesia    "brother is allergic and gets sick"  . Migraine    rare occular migraine- none recent (09/28/2015)  . PAF (paroxysmal atrial fibrillation) ([place])   . Pneumonia 1940s  . Primary osteoarthritis of left hip   . Unsteady gait   . Varicose vein    hx. of-     Past Surgical History:  Procedure Laterality Date  . BASAL CELL  CARCINOMA EXCISION Bilateral    "shins"  . COLONOSCOPY WITH PROPOFOL N/A 10/28/2012   Procedure: COLONOSCOPY WITH PROPOFOL;  Surgeon: Garlan Fair, MD;  Location: [facility];  Service: Endoscopy;  Laterality: N/A;  . DILATION AND CURETTAGE OF UTERUS    . JOINT REPLACEMENT    . TOTAL HIP ARTHROPLASTY Left 09/27/2015  . TOTAL HIP ARTHROPLASTY Left 09/27/2015   Procedure: TOTAL HIP ARTHROPLASTY ANTERIOR APPROACH;  Surgeon: Melrose Nakayama, MD;  Location: [facility];  Service: Orthopedics;  Laterality: Left;  . TUBAL LIGATION  1970s  . VARICOSE VEIN SURGERY Left 1970s    Current Medications: Outpatient Medications Prior to Visit  Medication Sig Dispense Refill  . calcium-vitamin D (OSCAL WITH D) 500-200 MG-UNIT per tablet Take 1 tablet by mouth daily.    Marland Kitchen conjugated estrogens (PREMARIN) vaginal cream Place 1 Applicatorful vaginally once a week. No specific day    . diltiazem (CARDIZEM CD) 240 MG 24 hr capsule Take 1 capsule (240 mg total) by mouth every morning. 90 capsule 2  . glucosamine-chondroitin 500-400 MG tablet Take 0.5 tablets by mouth daily.     Marland Kitchen loratadine (CLARITIN) 10 MG tablet Take 10 mg by mouth daily. Take 1 qd, stopping on 5/24.  May also take 1 QD PRN, stopping on 7/6.    . multivitamin with minerals (CERTA-VITE) LIQD  Take 1 tablet by mouth daily.    . rivaroxaban (XARELTO) 20 MG TABS tablet Take 1 tablet (20 mg total) by mouth daily with supper. 90 tablet 3  . triamcinolone cream (KENALOG) 0.1 % Apply 1 application topically daily as needed. For rash and itching    . diltiazem (CARDIZEM) 30 MG tablet Take 1 tablet (30 mg total) by mouth every 6 (six) hours as needed (for rapid heart beat). 120 tablet 10   No facility-administered medications prior to visit.      Allergies:   Other and Iodine solution [povidone iodine]   Social History   Social History  . Marital status: Married    Spouse name: N/A  . Number of children: N/A  . Years of education: N/A   Social History  Main Topics  . Smoking status: Former Smoker    Packs/day: 0.25    Years: 20.00    Types: Cigarettes    Quit date: 10/01/1983  . Smokeless tobacco: Never Used  . Alcohol use 8.4 oz/week    14 Glasses of wine per week  . Drug use: No  . Sexual activity: Not Currently   Other Topics Concern  . None   Social History Narrative  . None     Family History:  The patient's family history includes Diabetes type II in her brother; Hypertension in her brother.   ROS:   Please see the history of present illness.    ROS All other systems reviewed and are negative.   PHYSICAL EXAM:   VS:  BP 118/80   Pulse 76   Ht 5' 5.5" (1.664 m)   Wt 155 lb (70.3 kg)   LMP  (LMP Unknown)   BMI 25.40 kg/m    GEN: Well nourished, well developed, in no acute distress  HEENT: normal  Neck: no JVD, carotid bruits, or masses Cardiac: RRR; no murmurs, rubs, or gallops,no edema (East brief PSVT episode approximately 200 bpm auscultated when she felt aura) Respiratory:  clear to auscultation bilaterally, normal work of breathing GI: soft, nontender, nondistended, + BS MS: no deformity or atrophy  Skin: warm and dry, no rash Neuro:  Alert and Oriented x 3, Strength and sensation are intact Psych: euthymic mood, full affect  Wt Readings from Last 3 Encounters:  07/04/16 155 lb (70.3 kg)  05/22/16 150 lb 4.8 oz (68.2 kg)  03/19/16 152 lb 6.4 oz (69.1 kg)      Studies/Labs Reviewed:   EKG:  EKG is ordered today.  The ekg ordered today demonstrates 07/04/16-on auscultation she was going to 200 bpm, by the time EKG was set up she was back in normal sinus rhythm at 75 bpm with no other abnormalities. Some reviewed  Recent Labs: 05/22/2016: ALT 20; B Natriuretic Peptide 31.7; Magnesium 2.0 05/23/2016: BUN 8; Creatinine, Ser 0.72; Hemoglobin 14.1; Platelets 299; Potassium 3.9; Sodium 141; TSH 6.001   Lipid Panel No results found for: CHOL, TRIG, HDL, CHOLHDL, VLDL, LDLCALC, LDLDIRECT  Additional  studies/ records that were reviewed today include:  EKG, hospital records, lab work reviewed    ASSESSMENT:    1. SVT (supraventricular tachycardia) ([place])   2. PAF (paroxysmal atrial fibrillation) ([place])   3. Chronic anticoagulation      PLAN:  In order of problems listed above:  PSVT-207 bpm. Dizzy with this. EP referral is in. Dr. Rayann Heman preferred by patient. This SVT seems to have some mild irregularity to it, I wonder if this is rapid atrial fibrillation and not a  reentrant circuit.? This is more likely behaving like PSVT. She feels an aura before her episodes. She had one here in our office. I was able to auscultate her and she was going about 200 bpm.  I think she would benefit from ablative procedure since we are increasing her diltiazem without any change in symptoms. They are increasing in frequency.  EMS telemetry strip personally viewed from previous hospitalization in late December showed retrograde P wave at 197 bpm, see media in chart.  Paroxysmal atrial fibrillation???  - After carefully reviewing her history, she has never worn a monitor in the past. Her sibling had atrial fibrillation and she assumed in the past that her pounding, racing heartbeat was atrial fibrillation and this was diagnosed previously by Dr. Lavone Orn after she saw his nurse practitioner at [facility] during one of these lengthy episodes. She remembers there being some concern for possible atrial flutter. He was treating her with anticoagulation at the time and at her request she was referred to me to follow her atrial fibrillation. In other words, this diagnosis had been carried over for approximately a year until I saw her.  - Clearly, she has not demonstrated atrial fibrillation here in the hospital. She has had paroxysmal atrial tachycardia on cardiac strips/telemetry here with heart rate of approximate 120 bpm with clear P waves preceding the QRS complexes sometimes buried within the T-wave.  Her EMS strip when she had a brief syncopal episodes while at [employer] clearly demonstrates a PSVT of 197 bpm, a different arrhythmia, with what appears to be retrograde P waves. None of the strips have shown atrial fibrillation.  She may not need Xarelto.  Medication Adjustments/Labs and Tests Ordered: Current medicines are reviewed at length with the patient today.  Concerns regarding medicines are outlined above.  Medication changes, Labs and Tests ordered today are listed in the Patient Instructions below. Patient Instructions  Medication Instructions:  Your physician recommends that you continue on your current medications as directed. Please refer to the Current Medication list given to you today.   Labwork: none  Testing/Procedures: none  Follow-Up: Your physician recommends that you schedule a follow-up appointment as needed with Dr Marlou Porch.  See Dr. Rayann Heman next week as planned.     Any Other Special Instructions Will Be Listed Below (If Applicable).     If you need a refill on your cardiac medications before your next appointment, please call your pharmacy.      Signed, Candee Furbish, MD  07/04/2016 3:05 PM    [facility] [place], [place],   [address] Phone: [phone]; Fax: [phone]

## 2016-07-06 ENCOUNTER — Encounter: Payer: Self-pay | Admitting: Internal Medicine

## 2016-07-11 ENCOUNTER — Encounter: Payer: Self-pay | Admitting: Internal Medicine

## 2016-07-11 ENCOUNTER — Ambulatory Visit (INDEPENDENT_AMBULATORY_CARE_PROVIDER_SITE_OTHER): Payer: Medicare HMO | Admitting: Internal Medicine

## 2016-07-11 VITALS — BP 102/80 | HR 94 | Ht 65.5 in | Wt 154.6 lb

## 2016-07-11 DIAGNOSIS — I471 Supraventricular tachycardia: Secondary | ICD-10-CM | POA: Diagnosis not present

## 2016-07-11 DIAGNOSIS — I480 Paroxysmal atrial fibrillation: Secondary | ICD-10-CM

## 2016-07-11 NOTE — Patient Instructions (Addendum)
Medication Instructions:  Your physician recommends that you continue on your current medications as directed. Please refer to the Current Medication list given to you today.   Labwork: Your physician recommends that you return for lab work on 08/02/16 at 3:30---you do not have to fast   Testing/Procedures:  Your physician has recommended that you have an ablation. Catheter ablation is a medical procedure used to treat some cardiac arrhythmias (irregular heartbeats). During catheter ablation, a long, thin, flexible tube is put into a blood vessel in your groin (upper thigh), or neck. This tube is called an ablation catheter. It is then guided to your heart through the blood vessel. Radio frequency waves destroy small areas of heart tissue where abnormal heartbeats may cause an arrhythmia to start. Please see the instruction sheet given to you today.--08/09/16   Please arrive at [facility] at 5:30am Do not eat or drink after midnight the night before your procedure Do not take any medications the morning of the procedure Hold Diltiazem for 48 hours  Plan for one night stay  Will need someone to drive you home at discharge        Follow-Up:  Your physician recommends that you schedule a follow-up appointment in: 4 weeks from 08/09/16 with Dr Rayann Heman    Any Other Special Instructions Will Be Listed Below (If Applicable).     If you need a refill on your cardiac medications before your next appointment, please call your pharmacy.

## 2016-07-15 NOTE — Progress Notes (Addendum)
Electrophysiology Office Note   Date:  07/15/2016   ID:  Tonya Turner, DOB September 16, 1940, MRN [id number]  PCP:  Irven Shelling, MD  Cardiologist:  Dr Marlou Porch Primary Electrophysiologist: Thompson Grayer, MD    Chief Complaint  Patient presents with  . New Patient (Initial Visit)    SVT     History of Present Illness: Tonya Turner is a 76 y.o. female who presents today for electrophysiology evaluation.   She has been followed by Dr Marlou Porch for several years (I have reviewed notes back to 2015).  Though she was initially thought to have afib, she has recently been found to have primarily SVT.  She has frequent but short episodes.  She describes palpitations and fatigue.  She is quite anxious.  She recently wore an event monitor which documented mid RP SVT up to 205 bpm.  She has not previously found vagal maneuvers to be helpful.  She has never required adenosine as episodes will typically terminate.    Today, she denies symptoms of  chest pain, shortness of breath, orthopnea, PND, lower extremity edema, claudication, dizziness, presyncope, syncope, bleeding, or neurologic sequela. The patient is tolerating medications without difficulties and is otherwise without complaint today.    Past Medical History:  Diagnosis Date  . Arthritis    "hands-mild; left hip" (09/28/2015)  . Atrial fibrillation ([place])   . Basal cell carcinoma of lower leg    "cut out on both shins"  . DDD (degenerative disc disease), lumbar   . Degenerative disc disease, thoracic   . Eczema    history of  . Edema of extremities    usually lower extremities- bilateral  . Family history of adverse reaction to anesthesia    "brother is allergic and gets sick"  . Migraine    rare occular migraine- none recent (09/28/2015)  . PAF (paroxysmal atrial fibrillation) ([place])   . Pneumonia 1940s  . Primary osteoarthritis of left hip   . Unsteady gait   . Varicose vein    hx. of-    Past Surgical History:  Procedure  Laterality Date  . BASAL CELL CARCINOMA EXCISION Bilateral    "shins"  . COLONOSCOPY WITH PROPOFOL N/A 10/28/2012   Procedure: COLONOSCOPY WITH PROPOFOL;  Surgeon: Garlan Fair, MD;  Location: [facility];  Service: Endoscopy;  Laterality: N/A;  . DILATION AND CURETTAGE OF UTERUS    . JOINT REPLACEMENT    . TOTAL HIP ARTHROPLASTY Left 09/27/2015  . TOTAL HIP ARTHROPLASTY Left 09/27/2015   Procedure: TOTAL HIP ARTHROPLASTY ANTERIOR APPROACH;  Surgeon: Melrose Nakayama, MD;  Location: [facility];  Service: Orthopedics;  Laterality: Left;  . TUBAL LIGATION  1970s  . VARICOSE VEIN SURGERY Left 1970s     Current Outpatient Prescriptions  Medication Sig Dispense Refill  . Biotin 1 MG CAPS Take 1 capsule by mouth daily.    . calcium-vitamin D (OSCAL WITH D) 500-200 MG-UNIT per tablet Take 1 tablet by mouth daily.    Marland Kitchen conjugated estrogens (PREMARIN) vaginal cream Place 1 Applicatorful vaginally once a week. No specific day    . diltiazem (CARDIZEM CD) 240 MG 24 hr capsule Take 1 capsule (240 mg total) by mouth every morning. 90 capsule 2  . diltiazem (CARDIZEM) 30 MG tablet Take 1 tablet (30 mg total) by mouth every 6 (six) hours as needed (for rapid heart beat). 60 tablet 10  . glucosamine-chondroitin 500-400 MG tablet Take 0.5 tablets by mouth daily.     . Multiple Vitamins-Minerals (  CENTRUM ADULTS PO) Take 0.5 tablets by mouth daily. [employer] brand    . rivaroxaban (XARELTO) 20 MG TABS tablet Take 1 tablet (20 mg total) by mouth daily with supper. 90 tablet 3  . triamcinolone cream (KENALOG) 0.1 % Apply 1 application topically daily as needed. For rash and itching     No current facility-administered medications for this visit.     Allergies:   Other and Iodine solution [povidone iodine]   Social History:  The patient  reports that she quit smoking about 32 years ago. Her smoking use included Cigarettes. She has a 5.00 pack-year smoking history. She has never used smokeless tobacco. She reports  that she drinks about 8.4 oz of alcohol per week . She reports that she does not use drugs.   Family History:  The patient's  family history includes Diabetes type II in her brother; Hypertension in her brother.    ROS:  Please see the history of present illness.   All other systems are reviewed and negative.    PHYSICAL EXAM: VS:  BP 102/80   Pulse 94   Ht 5' 5.5" (1.664 m)   Wt 154 lb 9.6 oz (70.1 kg)   LMP  (LMP Unknown)   BMI 25.34 kg/m  , BMI Body mass index is 25.34 kg/m. GEN: Well nourished, well developed, in no acute distress  HEENT: normal  Neck: no JVD, carotid bruits, or masses Cardiac: RRR; no murmurs, rubs, or gallops,no edema  Respiratory:  clear to auscultation bilaterally, normal work of breathing GI: soft, nontender, nondistended, + BS MS: no deformity or atrophy  Skin: warm and dry  Neuro:  Strength and sensation are intact Psych: anxious mood, full affect  EKG:  EKG 07/04/16 is reviewed   Recent Labs: 05/22/2016: ALT 20; B Natriuretic Peptide 31.7; Magnesium 2.0 05/23/2016: BUN 8; Creatinine, Ser 0.72; Hemoglobin 14.1; Platelets 299; Potassium 3.9; Sodium 141; TSH 6.001    Lipid Panel  No results found for: CHOL, TRIG, HDL, CHOLHDL, VLDL, LDLCALC, LDLDIRECT   Wt Readings from Last 3 Encounters:  07/11/16 154 lb 9.6 oz (70.1 kg)  07/04/16 155 lb (70.3 kg)  05/22/16 150 lb 4.8 oz (68.2 kg)      Other studies Reviewed: Additional studies/ records that were reviewed today include: Dr Marlou Porch notes are reviewed , echo 12/17 is reviewed, event monitor is reviewed Review of the above records today demonstrates: as above   ASSESSMENT AND PLAN:  1.  SVT The patient has very symptomatic recurrent mid RP SVT documented on recent monitor.  She has failed medical therapy with diltiazem. Therapeutic strategies for supraventricular tachycardia including medicine and ablation were discussed in detail with the patient today. Risk, benefits, and alternatives to  EP study and radiofrequency ablation were also discussed in detail today. These risks include but are not limited to stroke, bleeding, vascular damage, tamponade, perforation, damage to the heart and other structures, AV block requiring pacemaker, worsening renal function, and death. The patient understands these risk and wishes to proceed.  We will therefore proceed with catheter ablation at the next available time. Will request carto and anesthesia for this procedure.  2. afib Though she carries a diagnosis of afib, this is not well documented. afib was initially described after an office visit with Dr Laurann Montana.  I have asked her to try to obtain this ekg for my review.   Would consider stopping anticoagulation post ablation of SVT if she has no additional arrhythmias going forward.  Current medicines  are reviewed at length with the patient today.   The patient does not have concerns regarding her medicines.  The following changes were made today:  none  Labs/ tests ordered today include:  Orders Placed This Encounter  Procedures  . Basic metabolic panel  . CBC     Signed, Thompson Grayer, MD      [place] [address] [phone] (office) [phone] (fax)    Addendum: ekg from 01/30/2011 from Dr Delene Ruffini office is obtained and personally reviewed.  This ekg reveals afib with V rates 121 bpm.  Thompson Grayer MD, [facility] 07/23/2016 5:29 PM

## 2016-07-23 ENCOUNTER — Encounter: Payer: Self-pay | Admitting: Internal Medicine

## 2016-07-24 ENCOUNTER — Telehealth: Payer: Self-pay | Admitting: Internal Medicine

## 2016-07-24 DIAGNOSIS — Z13820 Encounter for screening for osteoporosis: Secondary | ICD-10-CM | POA: Diagnosis not present

## 2016-07-24 DIAGNOSIS — I471 Supraventricular tachycardia: Secondary | ICD-10-CM | POA: Diagnosis not present

## 2016-07-24 DIAGNOSIS — Z1389 Encounter for screening for other disorder: Secondary | ICD-10-CM | POA: Diagnosis not present

## 2016-07-24 DIAGNOSIS — Z0000 Encounter for general adult medical examination without abnormal findings: Secondary | ICD-10-CM | POA: Diagnosis not present

## 2016-07-24 NOTE — Telephone Encounter (Signed)
New message       Pt is scheduled to have an ablation 08-09-16.  She was seen on 07-11-16.  Pt states that she really did not get a chance to talk to Dr Rayann Heman about the procedure and to find out about drug options.  An appt has been scheduled for 3-12 at 8:30 to talk about this.  However, pt states that if Dr Rayann Heman can call her wed between 11:30-2:30 to answer her questions, she will not need to keep 08-06-16 appt.  Please let her know if she needs to keep 3-12 appt or if Dr Rayann Heman can call her wed.

## 2016-08-01 ENCOUNTER — Ambulatory Visit (INDEPENDENT_AMBULATORY_CARE_PROVIDER_SITE_OTHER): Payer: Medicare HMO | Admitting: Internal Medicine

## 2016-08-01 ENCOUNTER — Encounter: Payer: Self-pay | Admitting: Internal Medicine

## 2016-08-01 VITALS — BP 108/80 | HR 85 | Ht 65.0 in | Wt 155.0 lb

## 2016-08-01 DIAGNOSIS — I480 Paroxysmal atrial fibrillation: Secondary | ICD-10-CM

## 2016-08-01 DIAGNOSIS — I471 Supraventricular tachycardia: Secondary | ICD-10-CM

## 2016-08-01 MED ORDER — FLECAINIDE ACETATE 50 MG PO TABS: 50.0000 mg | ORAL_TABLET | Freq: Two times a day (BID) | ORAL | 3 refills | Status: DC

## 2016-08-01 NOTE — Progress Notes (Signed)
Electrophysiology Office Note   Date:  08/01/2016   ID:  Tonya Turner, DOB January 19, 1941, MRN [id number]  PCP:  Irven Shelling, MD  Cardiologist:  Dr Marlou Porch Primary Electrophysiologist: Thompson Grayer, MD    Chief Complaint  Patient presents with  . Atrial Fibrillation     History of Present Illness: Tonya Turner is a 76 y.o. female who presents today for electrophysiology evaluation.   She has been followed by Dr Marlou Porch for several years (I have reviewed notes back to 2015).  Review of prior ekgs reveals that she did have afib documented 01/30/2011.  More recently, she been found to have primarily SVT.  She has frequent but short episodes.  She describes palpitations and fatigue.  She is quite anxious.  She recently wore an event monitor which documented mid RP SVT up to 205 bpm.  She has not previously found vagal maneuvers to be helpful.  She has never required adenosine as episodes will typically terminate.   She saw me several weeks ago and decided to proceed with ablation.  Since that time, she has changed her mind.  She presents today for further discussions regarding medicine options.  Today, she denies symptoms of  chest pain, shortness of breath, orthopnea, PND, lower extremity edema, claudication, dizziness, presyncope, syncope, bleeding, or neurologic sequela. The patient is tolerating medications without difficulties and is otherwise without complaint today.    Past Medical History:  Diagnosis Date  . Arthritis    "hands-mild; left hip" (09/28/2015)  . Basal cell carcinoma of lower leg    "cut out on both shins"  . DDD (degenerative disc disease), lumbar   . Degenerative disc disease, thoracic   . Eczema    history of  . Edema of extremities    usually lower extremities- bilateral  . Family history of adverse reaction to anesthesia    "brother is allergic and gets sick"  . Migraine    rare occular migraine- none recent (09/28/2015)  . PAF (paroxysmal atrial  fibrillation) ([place]) 01/30/2011   ekg from Dr Delene Ruffini office is reviewed and confirms afib  . Pneumonia 1940s  . Primary osteoarthritis of left hip   . Unsteady gait   . Varicose vein    hx. of-    Past Surgical History:  Procedure Laterality Date  . BASAL CELL CARCINOMA EXCISION Bilateral    "shins"  . COLONOSCOPY WITH PROPOFOL N/A 10/28/2012   Procedure: COLONOSCOPY WITH PROPOFOL;  Surgeon: Garlan Fair, MD;  Location: [facility];  Service: Endoscopy;  Laterality: N/A;  . DILATION AND CURETTAGE OF UTERUS    . JOINT REPLACEMENT    . TOTAL HIP ARTHROPLASTY Left 09/27/2015  . TOTAL HIP ARTHROPLASTY Left 09/27/2015   Procedure: TOTAL HIP ARTHROPLASTY ANTERIOR APPROACH;  Surgeon: Melrose Nakayama, MD;  Location: [facility];  Service: Orthopedics;  Laterality: Left;  . TUBAL LIGATION  1970s  . VARICOSE VEIN SURGERY Left 1970s     Current Outpatient Prescriptions  Medication Sig Dispense Refill  . Biotin 1 MG CAPS Take 2 capsules by mouth daily.     . calcium-vitamin D (OSCAL WITH D) 500-200 MG-UNIT per tablet Take 1 tablet by mouth daily.    Marland Kitchen conjugated estrogens (PREMARIN) vaginal cream Place 1 Applicatorful vaginally once a week. No specific day    . diltiazem (CARDIZEM CD) 240 MG 24 hr capsule Take 1 capsule (240 mg total) by mouth every morning. 90 capsule 2  . diltiazem (CARDIZEM) 30 MG tablet Take 1 tablet (  30 mg total) by mouth every 6 (six) hours as needed (for rapid heart beat). 60 tablet 10  . glucosamine-chondroitin 500-400 MG tablet Take 0.5 tablets by mouth daily.     . Multiple Vitamins-Minerals (CENTRUM ADULTS PO) Take 0.5 tablets by mouth daily. [employer] brand    . rivaroxaban (XARELTO) 20 MG TABS tablet Take 1 tablet (20 mg total) by mouth daily with supper. 90 tablet 3  . triamcinolone cream (KENALOG) 0.1 % Apply 1 application topically daily as needed. For rash and itching     No current facility-administered medications for this visit.     Allergies:   Other; Adhesive  [tape]; and Iodine solution [povidone iodine]   Social History:  The patient  reports that she quit smoking about 32 years ago. Her smoking use included Cigarettes. She has a 5.00 pack-year smoking history. She has never used smokeless tobacco. She reports that she drinks about 8.4 oz of alcohol per week . She reports that she does not use drugs.   Family History:  The patient's  family history includes Diabetes type II in her brother; Hypertension in her brother.    ROS:  Please see the history of present illness.   All other systems are reviewed and negative.    PHYSICAL EXAM: VS:  BP 108/80   Pulse 85   Ht 5\' 5"  (1.651 m)   Wt 155 lb (70.3 kg)   LMP  (LMP Unknown)   SpO2 99%   BMI 25.79 kg/m  , BMI Body mass index is 25.79 kg/m. GEN: Well nourished, well developed, in no acute distress  HEENT: normal  Neck: no JVD, carotid bruits, or masses Cardiac: RRR; no murmurs, rubs, or gallops,no edema  Respiratory:  clear to auscultation bilaterally, normal work of breathing GI: soft, nontender, nondistended, + BS MS: no deformity or atrophy  Skin: warm and dry  Neuro:  Strength and sensation are intact Psych: anxious mood, full affect  EKG:  EKG 07/04/16 is personally reviewed again today   Recent Labs: 05/22/2016: ALT 20; B Natriuretic Peptide 31.7; Magnesium 2.0 05/23/2016: BUN 8; Creatinine, Ser 0.72; Hemoglobin 14.1; Platelets 299; Potassium 3.9; Sodium 141; TSH 6.001    Lipid Panel  No results found for: CHOL, TRIG, HDL, CHOLHDL, VLDL, LDLCALC, LDLDIRECT   Wt Readings from Last 3 Encounters:  08/01/16 155 lb (70.3 kg)  07/11/16 154 lb 9.6 oz (70.1 kg)  07/04/16 155 lb (70.3 kg)     ASSESSMENT AND PLAN:  1.  SVT The patient has very symptomatic recurrent mid RP SVT documented on recent monitor.  She has failed medical therapy with diltiazem. Therapeutic strategies for supraventricular tachycardia including medicine and ablation were discussed in detail with the  patient today. Risk, benefits, and alternatives to EP study and radiofrequency ablation were also discussed in detail today. At this time, she would prefer medical therapy.  We discussed risks and benefits of flecainide today.  She would like to proceed with this medicine. I will therefore start flecainide 50mg  BID today.  She will return in 4-5 days for EKG.  If she has additional SVT, we will increase flecainide to 100mg  BID at that time.   She has scheduled follow-up with Dr Marlou Porch 09/25/16.  If she is tolerating flecainide at that time and arrhythmias are controlled, I would advise exercise treadmill be arranged at that time.  2. afib Documented AF on ekg in 2012. Continue current therapy  Return to see Dr Marlou Porch as scheduled 09/25/16.  I will  see when needed going forward  Today, I have spent 25 minutes with the patient discussing SVT .  More than 50% of the visit time today was spent on this issue.   Army Fossa MD, [facility] 08/01/2016 2:29 PM

## 2016-08-01 NOTE — Patient Instructions (Signed)
Medication Instructions:  Your physician has recommended you make the following change in your medication:  1) Start Flecainide 50 mg twice daily   Labwork: None ordered   Testing/Procedures: None ordered   Follow-Up: Your physician recommends that you schedule a follow-up appointment on Mon for an EKG in nurse room.   Any Other Special Instructions Will Be Listed Below (If Applicable).     If you need a refill on your cardiac medications before your next appointment, please call your pharmacy.

## 2016-08-02 ENCOUNTER — Other Ambulatory Visit: Payer: Medicare HMO

## 2016-08-06 ENCOUNTER — Ambulatory Visit: Payer: Medicare HMO | Admitting: Internal Medicine

## 2016-08-06 ENCOUNTER — Ambulatory Visit (INDEPENDENT_AMBULATORY_CARE_PROVIDER_SITE_OTHER): Payer: Medicare HMO | Admitting: *Deleted

## 2016-08-06 DIAGNOSIS — I480 Paroxysmal atrial fibrillation: Secondary | ICD-10-CM

## 2016-08-06 DIAGNOSIS — Z79899 Other long term (current) drug therapy: Secondary | ICD-10-CM

## 2016-08-06 NOTE — Progress Notes (Signed)
1.  Reason for visit: EKG post low dose Flecainide start  2.  Name of MD requesting visit:  Allred  3. H&P:  N/A  4.  ROS related to problem:  N/A  5.  Assessment and plan per MD:  EKG showing NSR, reviewed w/ Dr. Rayann Heman.  Advised patient to continue on currently treatment plan.

## 2016-08-07 NOTE — Addendum Note (Signed)
Addended by: Stanton Kidney on: 08/07/2016 06:19 PM   Modules accepted: Orders

## 2016-08-09 ENCOUNTER — Ambulatory Visit (HOSPITAL_COMMUNITY): Admission: RE | Admit: 2016-08-09 | Payer: Medicare HMO | Source: Ambulatory Visit | Admitting: Internal Medicine

## 2016-08-09 ENCOUNTER — Encounter (HOSPITAL_COMMUNITY): Admission: RE | Payer: Self-pay | Source: Ambulatory Visit

## 2016-08-09 SURGERY — SVT ABLATION
Anesthesia: Monitor Anesthesia Care

## 2016-08-28 DIAGNOSIS — Z8619 Personal history of other infectious and parasitic diseases: Secondary | ICD-10-CM | POA: Diagnosis not present

## 2016-08-28 DIAGNOSIS — R350 Frequency of micturition: Secondary | ICD-10-CM | POA: Diagnosis not present

## 2016-08-28 DIAGNOSIS — Z9189 Other specified personal risk factors, not elsewhere classified: Secondary | ICD-10-CM | POA: Diagnosis not present

## 2016-09-05 ENCOUNTER — Ambulatory Visit: Payer: Medicare HMO | Admitting: Internal Medicine

## 2016-09-12 ENCOUNTER — Ambulatory Visit: Payer: Medicare HMO | Admitting: Internal Medicine

## 2016-09-25 ENCOUNTER — Encounter (INDEPENDENT_AMBULATORY_CARE_PROVIDER_SITE_OTHER): Payer: Self-pay

## 2016-09-25 ENCOUNTER — Ambulatory Visit (INDEPENDENT_AMBULATORY_CARE_PROVIDER_SITE_OTHER): Payer: Medicare HMO | Admitting: Cardiology

## 2016-09-25 ENCOUNTER — Encounter: Payer: Self-pay | Admitting: Cardiology

## 2016-09-25 VITALS — BP 110/68 | HR 65 | Ht 65.0 in | Wt 153.2 lb

## 2016-09-25 DIAGNOSIS — I471 Supraventricular tachycardia: Secondary | ICD-10-CM | POA: Diagnosis not present

## 2016-09-25 DIAGNOSIS — I480 Paroxysmal atrial fibrillation: Secondary | ICD-10-CM

## 2016-09-25 MED ORDER — FLECAINIDE ACETATE 50 MG PO TABS: 50.0000 mg | ORAL_TABLET | Freq: Two times a day (BID) | ORAL | 3 refills | Status: DC

## 2016-09-25 NOTE — Patient Instructions (Signed)
Medication Instructions:  The current medical regimen is effective;  continue present plan and medications.  Testing/Procedures: Your physician has requested that you have an exercise tolerance test. For further information please visit [email or web address]. Please also follow instruction sheet, as given.  Follow-Up: Follow up in 4 months with Dr Marlou Porch.  If you need a refill on your cardiac medications before your next appointment, please call your pharmacy.  Thank you for choosing [facility]!!

## 2016-09-25 NOTE — Progress Notes (Signed)
Cardiology Office Note    Date:  09/25/2016   ID:  Tonya Turner, DOB [birth year], MRN [id number]  PCP:  Tonya Shelling, MD  Cardiologist:   Tonya Furbish, MD     History of Present Illness:  Tonya Turner is a 76 y.o. female here for follow-up of PSVT. She was wearing a monitor and had heart rate of 206 bpm. Dizzy. Symptomatic. Referred her to Tonya Turner who she will be seeing soon for possible ablation. She has carried a diagnosis of paroxysmal atrial fibrillation however her current monitor seems to only demonstrate paroxysmal supraventricular tachycardia. Please see below for discussion but I inherited her as a patient a few years back after she had already been "diagnosed with atrial fibrillation ". I reviewed previous EKGs and do not see any overt evidence of atrial fibrillation. Xarelto.CHADS-VAS score and it is 2 -female, age greater than 23 She has diltiazem to 240 mg once a day She takes diltiazem as needed.  While she was sitting here she said "oh no I am feeling that sensation, aura, and sure enough I auscultated her and she was having SVT once again at approximately 200 bpm. By the time she laid down to get her EKG this had already broken.  09/25/16- I reviewed Tonya Turner note recomment on her very symptomatic recurrent mid RP SVT documented on monitor. She had failed medical therapy with diltiazem. Medicine and ablation were discussed at length. She preferred medical therapy and flecainide 50 mg twice a day was begun. She returned for 4-5 days for EKG, if she has additional SVT increased flecainide to 100 mg twice a day. He was aware of our visit today and stated that if she was tolerating her flecainide and that arrhythmias were controlled that he would advise an exercise treadmill test arranged at that time.  She reminds me that she was able to bring a copy of an EKG and presented to Tonya Turner for review and it was interpreted as atrial fibrillation. 2012.   Past Medical  History:  Diagnosis Date  . Arthritis    "hands-mild; left hip" (09/28/2015)  . Basal cell carcinoma of lower leg    "cut out on both shins"  . DDD (degenerative disc disease), lumbar   . Degenerative disc disease, thoracic   . Eczema    history of  . Edema of extremities    usually lower extremities- bilateral  . Family history of adverse reaction to anesthesia    "brother is allergic and gets sick"  . Migraine    rare occular migraine- none recent (09/28/2015)  . PAF (paroxysmal atrial fibrillation) ([place]) 01/30/2011   ekg from Dr Tonya Turner office is reviewed and confirms afib  . Pneumonia 1940s  . Primary osteoarthritis of left hip   . Unsteady gait   . Varicose vein    hx. of-     Past Surgical History:  Procedure Laterality Date  . BASAL CELL CARCINOMA EXCISION Bilateral    "shins"  . COLONOSCOPY WITH PROPOFOL N/A 10/28/2012   Procedure: COLONOSCOPY WITH PROPOFOL;  Surgeon: Tonya Fair, MD;  Location: [facility];  Service: Endoscopy;  Laterality: N/A;  . DILATION AND CURETTAGE OF UTERUS    . JOINT REPLACEMENT    . TOTAL HIP ARTHROPLASTY Left 09/27/2015  . TOTAL HIP ARTHROPLASTY Left 09/27/2015   Procedure: TOTAL HIP ARTHROPLASTY ANTERIOR APPROACH;  Surgeon: Tonya Nakayama, MD;  Location: [facility];  Service: Orthopedics;  Laterality: Left;  . TUBAL LIGATION  1970s  .  VARICOSE VEIN SURGERY Left 1970s    Current Medications: Outpatient Medications Prior to Visit  Medication Sig Dispense Refill  . Biotin 1 MG CAPS Take 2 capsules by mouth daily.     . calcium-vitamin D (OSCAL WITH D) 500-200 MG-UNIT per tablet Take 1 tablet by mouth daily.    Marland Kitchen conjugated estrogens (PREMARIN) vaginal cream Place 1 Applicatorful vaginally 2 (two) times a week. No specific day    . diltiazem (CARDIZEM CD) 240 MG 24 hr capsule Take 1 capsule (240 mg total) by mouth every morning. 90 capsule 2  . diltiazem (CARDIZEM) 30 MG tablet Take 1 tablet (30 mg total) by mouth every 6 (six) hours as needed  (for rapid heart beat). 60 tablet 10  . glucosamine-chondroitin 500-400 MG tablet Take 0.5 tablets by mouth daily.     . Multiple Vitamins-Minerals (CENTRUM ADULTS PO) Take 0.5 tablets by mouth daily. [employer] brand    . rivaroxaban (XARELTO) 20 MG TABS tablet Take 1 tablet (20 mg total) by mouth daily with supper. 90 tablet 3  . triamcinolone cream (KENALOG) 0.1 % Apply 1 application topically daily as needed. For rash and itching    . flecainide (TAMBOCOR) 50 MG tablet Take 1 tablet (50 mg total) by mouth 2 (two) times daily. 180 tablet 3   No facility-administered medications prior to visit.      Allergies:   Other; Adhesive [tape]; and Iodine solution [povidone iodine]   Social History   Social History  . Marital status: Married    Spouse name: N/A  . Number of children: N/A  . Years of education: N/A   Social History Main Topics  . Smoking status: Former Smoker    Packs/day: 0.25    Years: 20.00    Types: Cigarettes    Quit date: 10/01/1983  . Smokeless tobacco: Never Used  . Alcohol use 8.4 oz/week    14 Glasses of wine per week  . Drug use: No  . Sexual activity: Not Currently   Other Topics Concern  . None   Social History Narrative  . None     Family History:  The patient's family history includes Diabetes type II in her brother; Hypertension in her brother.   ROS:   Please see the history of present illness.    ROS no syncope, no further PSVT symptoms. Unless stated above all other review of systems negative.   PHYSICAL EXAM:   VS:  BP 110/68   Pulse 65   Ht 5\' 5"  (1.651 m)   Wt 153 lb 3.2 oz (69.5 kg)   LMP  (LMP Unknown)   BMI 25.49 kg/m    GEN: Well nourished, well developed, in no acute distress  HEENT: normal  Neck: no JVD, carotid bruits, or masses Cardiac: RRR; no murmurs, rubs, or gallops,no edema  Respiratory:  clear to auscultation bilaterally, normal work of breathing GI: soft, nontender, nondistended, + BS MS: no deformity or atrophy    Skin: warm and dry, no rash Neuro:  Alert and Oriented x 3, Strength and sensation are intact Psych: euthymic mood, full affect \  Wt Readings from Last 3 Encounters:  09/25/16 153 lb 3.2 oz (69.5 kg)  08/01/16 155 lb (70.3 kg)  07/11/16 154 lb 9.6 oz (70.1 kg)      Studies/Labs Reviewed:   EKG:  EKG is ordered today.  09/25/16 - SR 1st AVB, LAD QRS 62ms. The ekg ordered today demonstrates 07/04/16-on auscultation she was going to 200 bpm, by  the time EKG was set up she was back in normal sinus rhythm at 75 bpm with no other abnormalities. Some reviewed  Recent Labs: 05/22/2016: ALT 20; B Natriuretic Peptide 31.7; Magnesium 2.0 05/23/2016: BUN 8; Creatinine, Ser 0.72; Hemoglobin 14.1; Platelets 299; Potassium 3.9; Sodium 141; TSH 6.001   Lipid Panel No results found for: CHOL, TRIG, HDL, CHOLHDL, VLDL, LDLCALC, LDLDIRECT  Additional studies/ records that were reviewed today include:  EKG, hospital records, lab work reviewed    ASSESSMENT:    1. PSVT (paroxysmal supraventricular tachycardia) ([place])   2. PAF (paroxysmal atrial fibrillation) ([facility])      PLAN:  In order of problems listed above:  PSVT-207 bpm.Discussion with Tonya Turner reviewed. Flecainide 51 g twice a day is doing a great job. QRS narrow. Checking treadmill test.   Paroxysmal atrial fibrillation???  - in 2012, EKG demonstrates atrial fibrillation, this was reviewed by Tonya Turner. Paper copy presented.  - Xarelto. Despite not having any further documented episodes, she is still at risk and warrants anticoagulation.  Medication Adjustments/Labs and Tests Ordered: Current medicines are reviewed at length with the patient today.  Concerns regarding medicines are outlined above.  Medication changes, Labs and Tests ordered today are listed in the Patient Instructions below. Patient Instructions  Medication Instructions:  The current medical regimen is effective;  continue present plan and  medications.  Testing/Procedures: Your physician has requested that you have an exercise tolerance test. For further information please visit [email or web address]. Please also follow instruction sheet, as given.  Follow-Up: Follow up in 4 months with Dr Marlou Porch.  If you need a refill on your cardiac medications before your next appointment, please call your pharmacy.  Thank you for choosing [facility]!!        Signed, Tonya Furbish, MD  09/25/2016 2:47 PM    [address], [place], [place]  [address] Phone: [phone]; Fax: [phone]

## 2016-10-09 DIAGNOSIS — M8588 Other specified disorders of bone density and structure, other site: Secondary | ICD-10-CM | POA: Diagnosis not present

## 2016-10-09 DIAGNOSIS — Z13820 Encounter for screening for osteoporosis: Secondary | ICD-10-CM | POA: Diagnosis not present

## 2016-10-09 DIAGNOSIS — Z780 Asymptomatic menopausal state: Secondary | ICD-10-CM | POA: Diagnosis not present

## 2016-10-16 ENCOUNTER — Ambulatory Visit (INDEPENDENT_AMBULATORY_CARE_PROVIDER_SITE_OTHER): Payer: Medicare HMO

## 2016-10-16 ENCOUNTER — Encounter (INDEPENDENT_AMBULATORY_CARE_PROVIDER_SITE_OTHER): Payer: Self-pay

## 2016-10-16 DIAGNOSIS — I471 Supraventricular tachycardia: Secondary | ICD-10-CM | POA: Diagnosis not present

## 2016-10-16 LAB — EXERCISE TOLERANCE TEST
CSEPED: 6 min
CSEPEW: 7 METS
CSEPPHR: 131 {beats}/min
Exercise duration (sec): 0 s
MPHR: 144 {beats}/min
Percent HR: 90 %
RPE: 17
Rest HR: 73 {beats}/min

## 2016-10-19 DIAGNOSIS — M25552 Pain in left hip: Secondary | ICD-10-CM | POA: Diagnosis not present

## 2016-11-06 DIAGNOSIS — H00012 Hordeolum externum right lower eyelid: Secondary | ICD-10-CM | POA: Diagnosis not present

## 2016-11-13 ENCOUNTER — Other Ambulatory Visit: Payer: Self-pay | Admitting: Cardiology

## 2016-11-14 ENCOUNTER — Telehealth: Payer: Self-pay | Admitting: Cardiology

## 2016-11-14 MED ORDER — RIVAROXABAN 20 MG PO TABS: 20.0000 mg | ORAL_TABLET | Freq: Every day | ORAL | 1 refills | Status: DC

## 2016-11-14 NOTE — Telephone Encounter (Signed)
Refill sent to [facility].

## 2016-11-14 NOTE — Telephone Encounter (Signed)
Request for Xarelto 20mg  received; pt is 76 yrs old, wt-69.5kg, Crea-0.72 on 05/18/16, CrCl-71.60ml/min, last seen by Dr. Marlou Porch on 09/25/16; will send in refill request to requested pharmacy.

## 2016-11-14 NOTE — Telephone Encounter (Signed)
New message    *STAT* If patient is at the pharmacy, call can be transferred to refill team.   1. Which medications need to be refilled? (please list name of each medication and dose if known) XARELTO 20 MG TABS tablet  2. Which pharmacy/location (including street and city if local pharmacy) is medication to be sent to? [facility]  3. Do they need a 30 day or 90 day supply? 90 day supply

## 2016-11-14 NOTE — Telephone Encounter (Signed)
Routing to [facility] for review

## 2016-11-16 DIAGNOSIS — R42 Dizziness and giddiness: Secondary | ICD-10-CM | POA: Diagnosis not present

## 2016-11-20 DIAGNOSIS — N83209 Unspecified ovarian cyst, unspecified side: Secondary | ICD-10-CM | POA: Diagnosis not present

## 2016-11-20 DIAGNOSIS — N83201 Unspecified ovarian cyst, right side: Secondary | ICD-10-CM | POA: Diagnosis not present

## 2016-11-20 DIAGNOSIS — N83202 Unspecified ovarian cyst, left side: Secondary | ICD-10-CM | POA: Diagnosis not present

## 2016-11-21 ENCOUNTER — Other Ambulatory Visit: Payer: Self-pay | Admitting: Dermatology

## 2016-11-21 DIAGNOSIS — D492 Neoplasm of unspecified behavior of bone, soft tissue, and skin: Secondary | ICD-10-CM | POA: Diagnosis not present

## 2016-11-21 DIAGNOSIS — L820 Inflamed seborrheic keratosis: Secondary | ICD-10-CM | POA: Diagnosis not present

## 2016-11-21 DIAGNOSIS — L728 Other follicular cysts of the skin and subcutaneous tissue: Secondary | ICD-10-CM | POA: Diagnosis not present

## 2016-11-22 DIAGNOSIS — N949 Unspecified condition associated with female genital organs and menstrual cycle: Secondary | ICD-10-CM | POA: Diagnosis not present

## 2016-12-10 ENCOUNTER — Telehealth: Payer: Self-pay | Admitting: Gynecologic Oncology

## 2016-12-10 ENCOUNTER — Encounter: Payer: Self-pay | Admitting: Gynecologic Oncology

## 2016-12-10 NOTE — Telephone Encounter (Signed)
Appt has been scheduled for the pt to see Dr. Denman George on 7/23 at [facility] from Dr. Sundra Aland office will notify the. Letter mailed to the pt.

## 2016-12-17 ENCOUNTER — Encounter: Payer: Self-pay | Admitting: Gynecologic Oncology

## 2016-12-17 ENCOUNTER — Ambulatory Visit: Payer: Medicare HMO | Attending: Gynecologic Oncology | Admitting: Gynecologic Oncology

## 2016-12-17 VITALS — BP 134/57 | HR 74 | Temp 97.8°F | Resp 18 | Ht 65.0 in | Wt 152.1 lb

## 2016-12-17 DIAGNOSIS — M5136 Other intervertebral disc degeneration, lumbar region: Secondary | ICD-10-CM | POA: Diagnosis not present

## 2016-12-17 DIAGNOSIS — Z87891 Personal history of nicotine dependence: Secondary | ICD-10-CM | POA: Insufficient documentation

## 2016-12-17 DIAGNOSIS — N83201 Unspecified ovarian cyst, right side: Secondary | ICD-10-CM | POA: Diagnosis not present

## 2016-12-17 DIAGNOSIS — Z85828 Personal history of other malignant neoplasm of skin: Secondary | ICD-10-CM | POA: Diagnosis not present

## 2016-12-17 DIAGNOSIS — N83209 Unspecified ovarian cyst, unspecified side: Secondary | ICD-10-CM

## 2016-12-17 DIAGNOSIS — M5134 Other intervertebral disc degeneration, thoracic region: Secondary | ICD-10-CM | POA: Diagnosis not present

## 2016-12-17 DIAGNOSIS — N83202 Unspecified ovarian cyst, left side: Secondary | ICD-10-CM

## 2016-12-17 DIAGNOSIS — Z7901 Long term (current) use of anticoagulants: Secondary | ICD-10-CM | POA: Diagnosis not present

## 2016-12-17 DIAGNOSIS — I480 Paroxysmal atrial fibrillation: Secondary | ICD-10-CM | POA: Diagnosis not present

## 2016-12-17 NOTE — Patient Instructions (Signed)
Plan to follow up with Dr. Landry Mellow with an annual ultrasound.  Please call for any questions or concerns.

## 2016-12-17 NOTE — Progress Notes (Signed)
Consult Note: Gyn-Onc  Consult was requested by Dr. Landry Mellow for the evaluation of Tonya Turner 76 y.o. female  CC:  Chief Complaint  Patient presents with  . Cyst of ovary, unspecified laterality    Assessment/Plan:  Tonya Turner  is a 76 y.o.  year old with bilateral ovarian simple cysts and normal CA 125.  I reviewed the [place] report and CA 125. I feel confident that despite the growth in the left ovarian cyst, it is a benign lesion. I discussed with [name] that she could either have repeated annual US's to monitor the cyst (for peace of mind) or US's ordered based on progression of symptoms of mass effect. Either would be reasonable. [name] is electing for annual [place] surveillance.   I discussed that it is possible that the cyst will continue to grow in size slowly over time as it fills with fluid however, surgery is not necessary unless the cyst becomes symptomatic.  I discussed that she should review the necessity of being on xarelto anticoagulant given that this would increase her risk for surgical complications should she elect for surgery in the future.   HPI: Tonya Turner is a 76 year old woman who is seen in consultation at the request of Dr Landry Mellow for bilateral ovarian cysts.  The patient had an MRI of the pelvis in the summer of 2017 to evaluate her hip replacement. An incidental finding of an ovarian cyst. She was then seen by Dr Landry Mellow who performed an [place] in June 2017 which showed a 4+cm left ovarian cyst. It was re-evaluated 3 months later and was stable in size  She has been doing well since that time, though does feel some urinary frequency (but feels this could be due to age).  The patient had a repeat US on 11/20/16 which showed a normal uterus and a right ovary containing a 3.5cm simple cyst and a left ovarian simple cyst measuring 5.4x5.1x4.6cm which had grown from prior US. CA 125 was normal at 7.5.  Current Meds:  Outpatient Encounter Prescriptions as of  12/17/2016  Medication Sig  . Biotin 1 MG CAPS Take 2 capsules by mouth daily.   . calcium-vitamin D (OSCAL WITH D) 500-200 MG-UNIT per tablet Take 1 tablet by mouth daily.  Marland Kitchen conjugated estrogens (PREMARIN) vaginal cream Place 1 Applicatorful vaginally 2 (two) times a week. No specific day  . diltiazem (CARDIZEM CD) 240 MG 24 hr capsule Take 1 capsule (240 mg total) by mouth every morning.  . diltiazem (CARDIZEM) 30 MG tablet Take 1 tablet (30 mg total) by mouth every 6 (six) hours as needed (for rapid heart beat).  . flecainide (TAMBOCOR) 50 MG tablet Take 1 tablet (50 mg total) by mouth 2 (two) times daily.  Marland Kitchen glucosamine-chondroitin 500-400 MG tablet Take 0.5 tablets by mouth daily.   . Multiple Vitamins-Minerals (CENTRUM ADULTS PO) Take 0.5 tablets by mouth daily. [employer] brand  . rivaroxaban (XARELTO) 20 MG TABS tablet Take 1 tablet (20 mg total) by mouth daily with supper.  . triamcinolone cream (KENALOG) 0.1 % Apply 1 application topically daily as needed. For rash and itching   No facility-administered encounter medications on file as of 12/17/2016.     Allergy:  Allergies  Allergen Reactions  . Other Rash    Adhesives-skin irritation  - EKG electrodes cause issues if left on for too long  . Adhesive [Tape]     Skin irritation  . Iodine Solution [Povidone Iodine] Dermatitis  Topical -causes skin inflammation    Social Hx:   Social History   Social History  . Marital status: Married    Spouse name: N/A  . Number of children: N/A  . Years of education: N/A   Occupational History  . Not on file.   Social History Main Topics  . Smoking status: Former Smoker    Packs/day: 0.25    Years: 20.00    Types: Cigarettes    Quit date: 10/01/1983  . Smokeless tobacco: Never Used  . Alcohol use 8.4 oz/week    14 Glasses of wine per week  . Drug use: No  . Sexual activity: Not Currently   Other Topics Concern  . Not on file   Social History Narrative  . No narrative on  file    Past Surgical Hx:  Past Surgical History:  Procedure Laterality Date  . BASAL CELL CARCINOMA EXCISION Bilateral    "shins"  . COLONOSCOPY WITH PROPOFOL N/A 10/28/2012   Procedure: COLONOSCOPY WITH PROPOFOL;  Surgeon: Garlan Fair, MD;  Location: [facility];  Service: Endoscopy;  Laterality: N/A;  . DILATION AND CURETTAGE OF UTERUS    . JOINT REPLACEMENT    . TOTAL HIP ARTHROPLASTY Left 09/27/2015  . TOTAL HIP ARTHROPLASTY Left 09/27/2015   Procedure: TOTAL HIP ARTHROPLASTY ANTERIOR APPROACH;  Surgeon: Melrose Nakayama, MD;  Location: [facility];  Service: Orthopedics;  Laterality: Left;  . TUBAL LIGATION  1970s  . VARICOSE VEIN SURGERY Left 1970s    Past Medical Hx:  Past Medical History:  Diagnosis Date  . Arthritis    "hands-mild; left hip" (09/28/2015)  . Basal cell carcinoma of lower leg    "cut out on both shins"  . DDD (degenerative disc disease), lumbar   . Degenerative disc disease, thoracic   . Eczema    history of  . Edema of extremities    usually lower extremities- bilateral  . Family history of adverse reaction to anesthesia    "brother is allergic and gets sick"  . Migraine    rare occular migraine- none recent (09/28/2015)  . PAF (paroxysmal atrial fibrillation) ([place]) 01/30/2011   ekg from Dr Delene Ruffini office is reviewed and confirms afib  . Pneumonia 1940s  . Primary osteoarthritis of left hip   . Unsteady gait   . Varicose vein    hx. of-     Past Gynecological History:  HPV infection No LMP recorded (lmp unknown). Patient is postmenopausal.  Family Hx:  Family History  Problem Relation Age of Onset  . Diabetes type II Brother   . Hypertension Brother   . Stroke Neg Hx     Review of Systems:  Constitutional  Feels well,    ENT Normal appearing ears and nares bilaterally Skin/Breast  No rash, sores, jaundice, itching, dryness Cardiovascular  No chest pain, shortness of breath, or edema  Pulmonary  No cough or wheeze.  Gastro Intestinal   No nausea, vomitting, or diarrhoea. No bright red blood per rectum, no abdominal pain, change in bowel movement, or constipation.  Genito Urinary  + frequency, no urgency, dysuria, no bleeding Musculo Skeletal  No myalgia, arthralgia, joint swelling or pain  Neurologic  No weakness, numbness, change in gait,  Psychology  No depression, anxiety, insomnia.   Vitals:  Blood pressure (!) 134/57, pulse 74, temperature 97.8 F (36.6 C), temperature source Oral, resp. rate 18, height 5\' 5"  (1.651 m), weight 152 lb 1.6 oz (69 kg), SpO2 100 %.  Physical Exam:  WD in NAD Neck  Supple NROM, without any enlargements.  Lymph Node Survey No cervical supraclavicular or inguinal adenopathy Cardiovascular  Pulse normal rate, regularity and rhythm. S1 and S2 normal.  Lungs  Clear to auscultation bilateraly, without wheezes/crackles/rhonchi. Good air movement.  Skin  No rash/lesions/breakdown  Psychiatry  Alert and oriented to person, place, and time  Abdomen  Normoactive bowel sounds, abdomen soft, non-tender and thin without evidence of hernia.  Back No CVA tenderness Genito Urinary  Vulva/vagina: Normal external female genitalia.  No lesions. No discharge or bleeding.  Bladder/urethra:  No lesions or masses, well supported bladder  Vagina: normal  Cervix: Normal appearing, no lesions.  Uterus:  Small, mobile, no parametrial involvement or nodularity.  Adnexa: no palpable masses. Rectal  deferred Extremities  No bilateral cyanosis, clubbing or edema.   Donaciano Eva, MD  12/17/2016, 9:27 AM

## 2017-01-29 ENCOUNTER — Other Ambulatory Visit: Payer: Self-pay | Admitting: Cardiology

## 2017-01-30 ENCOUNTER — Other Ambulatory Visit: Payer: Self-pay | Admitting: Dermatology

## 2017-01-30 DIAGNOSIS — L309 Dermatitis, unspecified: Secondary | ICD-10-CM | POA: Diagnosis not present

## 2017-01-30 DIAGNOSIS — D229 Melanocytic nevi, unspecified: Secondary | ICD-10-CM | POA: Diagnosis not present

## 2017-01-30 DIAGNOSIS — D492 Neoplasm of unspecified behavior of bone, soft tissue, and skin: Secondary | ICD-10-CM | POA: Diagnosis not present

## 2017-01-30 DIAGNOSIS — D225 Melanocytic nevi of trunk: Secondary | ICD-10-CM | POA: Diagnosis not present

## 2017-01-30 HISTORY — DX: Melanocytic nevi, unspecified: D22.9

## 2017-01-31 ENCOUNTER — Other Ambulatory Visit: Payer: Self-pay | Admitting: *Deleted

## 2017-01-31 MED ORDER — DILTIAZEM HCL ER COATED BEADS 240 MG PO CP24: 240.0000 mg | ORAL_CAPSULE | Freq: Every morning | ORAL | 1 refills | Status: DC

## 2017-02-06 ENCOUNTER — Other Ambulatory Visit: Payer: Self-pay | Admitting: *Deleted

## 2017-02-06 ENCOUNTER — Encounter: Payer: Self-pay | Admitting: Cardiology

## 2017-02-06 ENCOUNTER — Encounter (INDEPENDENT_AMBULATORY_CARE_PROVIDER_SITE_OTHER): Payer: Self-pay

## 2017-02-06 ENCOUNTER — Ambulatory Visit (INDEPENDENT_AMBULATORY_CARE_PROVIDER_SITE_OTHER): Payer: Medicare HMO | Admitting: Cardiology

## 2017-02-06 VITALS — BP 120/70 | HR 84 | Ht 65.0 in | Wt 155.0 lb

## 2017-02-06 DIAGNOSIS — I471 Supraventricular tachycardia: Secondary | ICD-10-CM | POA: Diagnosis not present

## 2017-02-06 DIAGNOSIS — Z79899 Other long term (current) drug therapy: Secondary | ICD-10-CM

## 2017-02-06 DIAGNOSIS — I480 Paroxysmal atrial fibrillation: Secondary | ICD-10-CM

## 2017-02-06 DIAGNOSIS — Z7901 Long term (current) use of anticoagulants: Secondary | ICD-10-CM | POA: Diagnosis not present

## 2017-02-06 MED ORDER — DILTIAZEM HCL ER COATED BEADS 240 MG PO CP24: 240.0000 mg | ORAL_CAPSULE | Freq: Every morning | ORAL | 0 refills | Status: DC

## 2017-02-06 MED ORDER — FLECAINIDE ACETATE 50 MG PO TABS: 50.0000 mg | ORAL_TABLET | Freq: Two times a day (BID) | ORAL | 0 refills | Status: DC

## 2017-02-06 MED ORDER — RIVAROXABAN 20 MG PO TABS: 20.0000 mg | ORAL_TABLET | Freq: Every day | ORAL | 0 refills | Status: DC

## 2017-02-06 NOTE — Progress Notes (Unsigned)
Pt would like to have lab work.  Reviewed with Dr Marlou Porch who ordered CBC and BMP.

## 2017-02-06 NOTE — Progress Notes (Signed)
Cardiology Office Note    Date:  02/06/2017   ID:  Tonya Turner, DOB Aug 31, 1940, MRN [id number]  PCP:  Tonya Orn, MD  Cardiologist:   Tonya Furbish, MD     History of Present Illness:  Tonya Turner is a 76 y.o. female here for follow-up of PSVT. She was wearing a monitor and had heart rate of 206 bpm. Dizzy. Symptomatic. Referred her to Tonya Turner who she will be seeing soon for possible ablation. She has carried a diagnosis of paroxysmal atrial fibrillation however her current monitor seems to only demonstrate paroxysmal supraventricular tachycardia. Please see below for discussion but I inherited her as a patient a few years back after she had already been "diagnosed with atrial fibrillation ". I reviewed previous EKGs and do not see any overt evidence of atrial fibrillation. Xarelto.CHADS-VAS score and it is 2 -female, age greater than 44 She has diltiazem to 240 mg once a day She takes diltiazem as needed.  While she was sitting here she said "oh no I am feeling that sensation, aura, and sure enough I auscultated her and she was having SVT once again at approximately 200 bpm. By the time she laid down to get her EKG this had already broken.  09/25/16- I reviewed Tonya Turner note recomment on her very symptomatic recurrent mid RP SVT documented on monitor. She had failed medical therapy with diltiazem. Medicine and ablation were discussed at length. She preferred medical therapy and flecainide 50 mg twice a day was begun. She returned for 4-5 days for EKG, if she has additional SVT increased flecainide to 100 mg twice a day. He was aware of our visit today and stated that if she was tolerating her flecainide and that arrhythmias were controlled that he would advise an exercise treadmill test arranged at that time.  She reminds me that she was able to bring a copy of an EKG and presented to Tonya Turner for review and it was interpreted as atrial fibrillation. 2012.  02/06/17-today she  is here for follow-up of SVT. She is doing very well on low-dose flecainide. She desires to, the Xarelto. We had lengthy discussion about this. She wants to discuss this further with Tonya Turner as well.   Past Medical History:  Diagnosis Date  . Arthritis    "hands-mild; left hip" (09/28/2015)  . Basal cell carcinoma of lower leg    "cut out on both shins"  . DDD (degenerative disc disease), lumbar   . Degenerative disc disease, thoracic   . Eczema    history of  . Edema of extremities    usually lower extremities- bilateral  . Family history of adverse reaction to anesthesia    "brother is allergic and gets sick"  . Migraine    rare occular migraine- none recent (09/28/2015)  . PAF (paroxysmal atrial fibrillation) ([place]) 01/30/2011   ekg from Dr Tonya Turner office is reviewed and confirms afib  . Pneumonia 1940s  . Primary osteoarthritis of left hip   . Unsteady gait   . Varicose vein    hx. of-     Past Surgical History:  Procedure Laterality Date  . BASAL CELL CARCINOMA EXCISION Bilateral    "shins"  . COLONOSCOPY WITH PROPOFOL N/A 10/28/2012   Procedure: COLONOSCOPY WITH PROPOFOL;  Surgeon: Tonya Fair, MD;  Location: [facility];  Service: Endoscopy;  Laterality: N/A;  . DILATION AND CURETTAGE OF UTERUS    . JOINT REPLACEMENT    . TOTAL HIP  ARTHROPLASTY Left 09/27/2015  . TOTAL HIP ARTHROPLASTY Left 09/27/2015   Procedure: TOTAL HIP ARTHROPLASTY ANTERIOR APPROACH;  Surgeon: Tonya Nakayama, MD;  Location: [facility];  Service: Orthopedics;  Laterality: Left;  . TUBAL LIGATION  1970s  . VARICOSE VEIN SURGERY Left 1970s    Current Medications: Outpatient Medications Prior to Visit  Medication Sig Dispense Refill  . Biotin 1 MG CAPS Take 2 capsules by mouth daily.     . calcium-vitamin D (OSCAL WITH D) 500-200 MG-UNIT per tablet Take 1 tablet by mouth daily.    Marland Kitchen conjugated estrogens (PREMARIN) vaginal cream Place 1 Applicatorful vaginally 2 (two) times a week. No specific day      . diltiazem (CARDIZEM CD) 240 MG 24 hr capsule Take 1 capsule (240 mg total) by mouth every morning. 90 capsule 1  . diltiazem (CARDIZEM) 30 MG tablet Take 1 tablet (30 mg total) by mouth every 6 (six) hours as needed (for rapid heart beat). 60 tablet 10  . flecainide (TAMBOCOR) 50 MG tablet Take 1 tablet (50 mg total) by mouth 2 (two) times daily. 180 tablet 3  . glucosamine-chondroitin 500-400 MG tablet Take 0.5 tablets by mouth daily.     . Multiple Vitamins-Minerals (CENTRUM ADULTS PO) Take 0.5 tablets by mouth daily. [employer] brand    . rivaroxaban (XARELTO) 20 MG TABS tablet Take 1 tablet (20 mg total) by mouth daily with supper. 90 tablet 1  . triamcinolone cream (KENALOG) 0.1 % Apply 1 application topically daily as needed. For rash and itching     No facility-administered medications prior to visit.      Allergies:   Other; Adhesive [tape]; and Iodine solution [povidone iodine]   Social History   Social History  . Marital status: Married    Spouse name: N/A  . Number of children: N/A  . Years of education: N/A   Social History Main Topics  . Smoking status: Former Smoker    Packs/day: 0.25    Years: 20.00    Types: Cigarettes    Quit date: 10/01/1983  . Smokeless tobacco: Never Used  . Alcohol use 8.4 oz/week    14 Glasses of wine per week  . Drug use: No  . Sexual activity: Not Currently   Other Topics Concern  . None   Social History Narrative  . None     Family History:  The patient's family history includes Diabetes type II in her brother; Hypertension in her brother.   ROS:   Please see the history of present illness.    ROS no syncope, no further PSVT symptoms. Unless stated above all other review of systems negative.   PHYSICAL EXAM:   VS:  BP 120/70   Pulse 84   Ht 5\' 5"  (1.651 m)   Wt 155 lb (70.3 kg)   LMP  (LMP Unknown)   BMI 25.79 kg/m    GEN: Well nourished, well developed, in no acute distress  HEENT: normal  Neck: no JVD, carotid bruits,  or masses Cardiac: RRR; no murmurs, rubs, or gallops,no edema  Respiratory:  clear to auscultation bilaterally, normal work of breathing GI: soft, nontender, nondistended, + BS MS: no deformity or atrophy  Skin: warm and dry, no rash Neuro:  Alert and Oriented x 3, Strength and sensation are intact Psych: euthymic mood, full affect   Wt Readings from Last 3 Encounters:  02/06/17 155 lb (70.3 kg)  12/17/16 152 lb 1.6 oz (69 kg)  09/25/16 153 lb 3.2 oz (69.5  kg)      Studies/Labs Reviewed:   EKG:  EKG is ordered today.  09/25/16 - SR 1st AVB, LAD QRS 42ms. The ekg ordered today demonstrates 07/04/16-on auscultation she was going to 200 bpm, by the time EKG was set up she was back in normal sinus rhythm at 75 bpm with no other abnormalities. Some reviewed  Recent Labs: 05/22/2016: ALT 20; B Natriuretic Peptide 31.7; Magnesium 2.0 05/23/2016: BUN 8; Creatinine, Ser 0.72; Hemoglobin 14.1; Platelets 299; Potassium 3.9; Sodium 141; TSH 6.001   Lipid Panel No results found for: CHOL, TRIG, HDL, CHOLHDL, VLDL, LDLCALC, LDLDIRECT  Additional studies/ records that were reviewed today include:  EKG, hospital records, lab work reviewed  Exercise treadmill test 10/16/16-excellent with flecainide.   ASSESSMENT:    No diagnosis found.   PLAN:  In order of problems listed above:  PSVT-207 bpm.Discussion with Tonya Turner reviewed. Flecainide 50 mg twice a day is doing a great job. QRS narrow. Treadmill test reassuring. Doing well, has not had any further incidences.   Paroxysmal atrial fibrillation  - in 2012, EKG demonstrates atrial fibrillation, this was reviewed by Tonya Turner. Paper copy presented. Her atrial fibrillation was documented on EKG in 2012. - Xarelto. Despite not having any further documented episodes, she is still at risk and warrants anticoagulation. She is hesitant to take this medication. I discussed this with her at length that given her prior history of atrial fibrillation  this could return and could result in stroke.  - She requested another conversation with Tonya Turner about this. We will set up this appointment.    Medication Adjustments/Labs and Tests Ordered: Current medicines are reviewed at length with the patient today.  Concerns regarding medicines are outlined above.  Medication changes, Labs and Tests ordered today are listed in the Patient Instructions below. There are no Patient Instructions on file for this visit.   Signed, Tonya Furbish, MD  02/06/2017 10:54 AM    [facility] [place], [place],   [address] Phone: [phone]; Fax: [phone]

## 2017-02-06 NOTE — Patient Instructions (Signed)
Medication Instructions:  The current medical regimen is effective;  continue present plan and medications.  Follow-Up: Follow up with Dr Rayann Heman to discuss Xarelto use.  Follow up in 1 year with Dr. Marlou Porch.  You will receive a letter in the mail 2 months before you are due.  Please call us when you receive this letter to schedule your follow up appointment.  If you need a refill on your cardiac medications before your next appointment, please call your pharmacy.  Thank you for choosing [facility]!!

## 2017-02-13 DIAGNOSIS — Z23 Encounter for immunization: Secondary | ICD-10-CM | POA: Diagnosis not present

## 2017-02-15 ENCOUNTER — Ambulatory Visit (INDEPENDENT_AMBULATORY_CARE_PROVIDER_SITE_OTHER): Payer: Medicare HMO | Admitting: Podiatry

## 2017-02-15 DIAGNOSIS — M779 Enthesopathy, unspecified: Secondary | ICD-10-CM

## 2017-02-15 DIAGNOSIS — M2041 Other hammer toe(s) (acquired), right foot: Secondary | ICD-10-CM

## 2017-02-15 MED ORDER — TRIAMCINOLONE ACETONIDE 10 MG/ML IJ SUSP
10.0000 mg | Freq: Once | INTRAMUSCULAR | Status: AC
  Administered 2017-02-15: 10 mg

## 2017-02-18 NOTE — Progress Notes (Signed)
Subjective:    Patient ID: Tonya Turner, female   DOB: 76 y.o.   MRN: [id number]   HPI patient presents concerned about pain in her forefoot right and states that she thinks her orthotics have worn out and she's can bring them at next visit. States that the pain is been present for several months and she also is not a smoker when questioned    Review of Systems  All other systems reviewed and are negative.       Objective:  Physical Exam  Constitutional: She appears well-developed and well-nourished.  Cardiovascular: Intact distal pulses.   Pulmonary/Chest: Effort normal.  Musculoskeletal: Normal range of motion.  Neurological: She is alert.  Skin: Skin is warm.  Nursing note and vitals reviewed.  neurovascular status found to be intact muscle strength adequate range of motion within normal limits with patient found to have inflammation pain of the second MPJ right foot with fluid buildup around the joint that's painful when palpated. Patient has good digital perfusion well oriented 3. Also noted to have digital deformity of the lesser digits right     Assessment:    Inflammatory capsulitis of the second MPJ right and also noted digital deformities of the second and third digits     Plan:    H&P conditions reviewed and today I explained capsulitis and inflammation did a proximal nerve block aspirated the joint was able to get out a small amount of clear fluid and injected with a quarter cc dexamethasone Kenalog and applied thick pad to reduce pressure on the joint along with rigid bottom shoes. Reappoint to recheck  X-rays indicate no indication of stress fracture or advanced arthritis

## 2017-03-21 ENCOUNTER — Ambulatory Visit (INDEPENDENT_AMBULATORY_CARE_PROVIDER_SITE_OTHER): Payer: Medicare HMO | Admitting: Podiatry

## 2017-03-21 DIAGNOSIS — M779 Enthesopathy, unspecified: Secondary | ICD-10-CM

## 2017-03-21 DIAGNOSIS — M2041 Other hammer toe(s) (acquired), right foot: Secondary | ICD-10-CM

## 2017-03-21 NOTE — Progress Notes (Signed)
Subjective:    Patient ID: Tonya Turner, female   DOB: 76 y.o.   MRN: [id number]   HPI overall doing well with diminished discomfort    ROS      Objective:  Physical Exam neurovascular status intact with patient's right second MPJ improved with pain still present and moderate rigid contracture of the toe with inflammation and pain within the MPJ     Assessment:    Inflammatory capsulitis related to rigidly contracted second digit and moderate structural bunion deformity     Plan:    H&P condition reviewed and recommended continued offloading with padding with possible modifications and orthotics. Ultimately may require digital fusion structural bunion correction which I reviewed with patient

## 2017-03-27 ENCOUNTER — Encounter: Payer: Self-pay | Admitting: Internal Medicine

## 2017-03-27 ENCOUNTER — Ambulatory Visit (INDEPENDENT_AMBULATORY_CARE_PROVIDER_SITE_OTHER): Payer: Medicare HMO | Admitting: Internal Medicine

## 2017-03-27 ENCOUNTER — Other Ambulatory Visit: Payer: Self-pay | Admitting: Cardiology

## 2017-03-27 VITALS — BP 114/66 | HR 68 | Ht 65.0 in | Wt 157.0 lb

## 2017-03-27 DIAGNOSIS — I471 Supraventricular tachycardia: Secondary | ICD-10-CM

## 2017-03-27 DIAGNOSIS — I480 Paroxysmal atrial fibrillation: Secondary | ICD-10-CM | POA: Diagnosis not present

## 2017-03-27 MED ORDER — RIVAROXABAN 20 MG PO TABS: 20.0000 mg | ORAL_TABLET | Freq: Every day | ORAL | 3 refills | Status: DC

## 2017-03-27 NOTE — Progress Notes (Signed)
PCP: Lavone Orn, MD Primary Cardiologist: Dr Marlou Porch Primary EP: Dr Rayann Heman  Tonya Turner is a 76 y.o. female who presents today for routine electrophysiology followup.  Since last being seen in our clinic, the patient reports doing very well.   SVT is well controlled.   Today, she denies symptoms of palpitations, chest pain, shortness of breath,  lower extremity edema, dizziness, presyncope, or syncope.  The patient is otherwise without complaint today.   Past Medical History:  Diagnosis Date  . Arthritis    "hands-mild; left hip" (09/28/2015)  . Basal cell carcinoma of lower leg    "cut out on both shins"  . DDD (degenerative disc disease), lumbar   . Degenerative disc disease, thoracic   . Eczema    history of  . Edema of extremities    usually lower extremities- bilateral  . Family history of adverse reaction to anesthesia    "brother is allergic and gets sick"  . Migraine    rare occular migraine- none recent (09/28/2015)  . PAF (paroxysmal atrial fibrillation) ([place]) 01/30/2011   ekg from Dr Delene Ruffini office is reviewed and confirms afib  . Pneumonia 1940s  . Primary osteoarthritis of left hip   . Unsteady gait   . Varicose vein    hx. of-    Past Surgical History:  Procedure Laterality Date  . BASAL CELL CARCINOMA EXCISION Bilateral    "shins"  . COLONOSCOPY WITH PROPOFOL N/A 10/28/2012   Procedure: COLONOSCOPY WITH PROPOFOL;  Surgeon: Garlan Fair, MD;  Location: [facility];  Service: Endoscopy;  Laterality: N/A;  . DILATION AND CURETTAGE OF UTERUS    . JOINT REPLACEMENT    . TOTAL HIP ARTHROPLASTY Left 09/27/2015  . TOTAL HIP ARTHROPLASTY Left 09/27/2015   Procedure: TOTAL HIP ARTHROPLASTY ANTERIOR APPROACH;  Surgeon: Melrose Nakayama, MD;  Location: [facility];  Service: Orthopedics;  Laterality: Left;  . TUBAL LIGATION  1970s  . VARICOSE VEIN SURGERY Left 1970s    ROS- all systems are reviewed and negatives except as per HPI above  Current Outpatient Prescriptions   Medication Sig Dispense Refill  . Biotin 1 MG CAPS Take 2 capsules by mouth daily.     . calcium-vitamin D (OSCAL WITH D) 500-200 MG-UNIT per tablet Take 1 tablet by mouth daily.    Marland Kitchen conjugated estrogens (PREMARIN) vaginal cream Place 1 Applicatorful vaginally 2 (two) times a week. No specific day    . diltiazem (CARDIZEM CD) 240 MG 24 hr capsule Take 1 capsule (240 mg total) by mouth every morning. 90 capsule 0  . diltiazem (CARDIZEM) 30 MG tablet Take 1 tablet (30 mg total) by mouth every 6 (six) hours as needed (for rapid heart beat). 60 tablet 10  . flecainide (TAMBOCOR) 50 MG tablet Take 1 tablet (50 mg total) by mouth 2 (two) times daily. 180 tablet 0  . glucosamine-chondroitin 500-400 MG tablet Take 0.5 tablets by mouth daily.     . Multiple Vitamins-Minerals (CENTRUM ADULTS PO) Take 0.5 tablets by mouth daily. [employer] brand    . triamcinolone cream (KENALOG) 0.1 % Apply 1 application topically daily as needed. For rash and itching    . XARELTO 20 MG TABS tablet TAKE 1 TABLET (20 MG TOTAL) BY MOUTH DAILY WITH SUPPER. 90 tablet 1   No current facility-administered medications for this visit.     Physical Exam: Vitals:   03/27/17 1607  BP: 114/66  Pulse: 68  SpO2: 98%  Weight: 157 lb (71.2 kg)  Height: 5\' 5"  (1.651 m)    GEN- The patient is well appearing, alert and oriented x 3 today.   Head- normocephalic, atraumatic Eyes-  Sclera clear, conjunctiva pink Ears- hearing intact Oropharynx- clear Lungs- Clear to ausculation bilaterally, normal work of breathing Heart- Regular rate and rhythm, no murmurs, rubs or gallops, PMI not laterally displaced GI- soft, NT, ND, + BS Extremities- no clubbing, cyanosis, or edema  EKG tracing ordered today is personally reviewed and shows sinus rhythm 68 bpm, p wave fractionation, otherwise normal ekg  Assessment and Plan:  1. SVT Well controlled  2. afib Well controlled (only a single documented episode in 2012) chads2vasc score is  3.  She wishes to stop xarelto. We discussed this at length today.  Options of 1. Stopping xarelto, 2. Continuing xarelto, 3. Implantable loop recorder to monitor for afib and determine anticoagulation, and 4. Apple watch to monitor for afib were discussed at length. She is not certain which course she wants to take.  She will think about this further and contact my office if she decides to make any changes.  Today, I have spent 25  minutes with the patient discussing anticoagulation.  More than 50% of the visit time today was spent on this issue.  Return in a year  Thompson Grayer MD, [facility] 03/27/2017 4:22 PM

## 2017-03-27 NOTE — Patient Instructions (Signed)
Medication Instructions:  Your physician recommends that you continue on your current medications as directed. Please refer to the Current Medication list given to you today.  -- If you need a refill on your cardiac medications before your next appointment, please call your pharmacy. --  Labwork: Your physician recommends that you return for lab work today:  CBC/BMET   Testing/Procedures: None ordered  Follow-Up: Your physician wants you to follow-up in: 1 year with Dr. Rayann Heman.  You will receive a reminder letter in the mail two months in advance. If you don't receive a letter, please call our office to schedule the follow-up appointment.  Thank you for choosing [facility]!!   Frederik Schmidt, RN [phone]  Any Other Special Instructions Will Be Listed Below (If Applicable).

## 2017-03-27 NOTE — Telephone Encounter (Signed)
Xarelto 20mg  refill received; pt is 76 yrs old, wt-70.3kg, Crea-0.72 on 05/23/16, last seen by Dr. Marlou Porch on 02/06/17 & seeing Dr. Rayann Heman today, CrCl-72.10ml/min. Since pt labs will expire in December, called Dr. Baldo Daub & spoke with Claiborne Billings & asked to order a BMET & CBC to ensure pt is on correct dosing for Xarelto.

## 2017-03-28 LAB — BASIC METABOLIC PANEL
BUN / CREAT RATIO: 15 (ref 12–28)
BUN: 12 mg/dL (ref 8–27)
CALCIUM: 9.3 mg/dL (ref 8.7–10.3)
CHLORIDE: 99 mmol/L (ref 96–106)
CO2: 24 mmol/L (ref 20–29)
CREATININE: 0.79 mg/dL (ref 0.57–1.00)
GFR calc Af Amer: 84 mL/min/{1.73_m2} (ref >59)
GFR calc non Af Amer: 72 mL/min/{1.73_m2} (ref >59)
GLUCOSE: 98 mg/dL (ref 65–99)
Potassium: 4.6 mmol/L (ref 3.5–5.2)
Sodium: 140 mmol/L (ref 134–144)

## 2017-03-28 LAB — CBC WITH DIFFERENTIAL/PLATELET
BASOS ABS: 0.1 10*3/uL (ref 0.0–0.2)
Basos: 1 %
EOS (ABSOLUTE): 0.1 10*3/uL (ref 0.0–0.4)
Eos: 1 %
HEMOGLOBIN: 13.4 g/dL (ref 11.1–15.9)
Hematocrit: 41.1 % (ref 34.0–46.6)
IMMATURE GRANS (ABS): 0 10*3/uL (ref 0.0–0.1)
IMMATURE GRANULOCYTES: 0 %
LYMPHS: 32 %
Lymphocytes Absolute: 2.3 10*3/uL (ref 0.7–3.1)
MCH: 31 pg (ref 26.6–33.0)
MCHC: 32.6 g/dL (ref 31.5–35.7)
MCV: 95 fL (ref 79–97)
MONOCYTES: 7 %
Monocytes Absolute: 0.5 10*3/uL (ref 0.1–0.9)
NEUTROS ABS: 4.2 10*3/uL (ref 1.4–7.0)
NEUTROS PCT: 59 %
PLATELETS: 337 10*3/uL (ref 150–379)
RBC: 4.32 x10E6/uL (ref 3.77–5.28)
RDW: 13.3 % (ref 12.3–15.4)
WBC: 7.2 10*3/uL (ref 3.4–10.8)

## 2017-04-10 ENCOUNTER — Other Ambulatory Visit: Payer: Self-pay | Admitting: Internal Medicine

## 2017-04-10 DIAGNOSIS — Z139 Encounter for screening, unspecified: Secondary | ICD-10-CM

## 2017-04-30 DIAGNOSIS — H2513 Age-related nuclear cataract, bilateral: Secondary | ICD-10-CM | POA: Diagnosis not present

## 2017-04-30 DIAGNOSIS — H52203 Unspecified astigmatism, bilateral: Secondary | ICD-10-CM | POA: Diagnosis not present

## 2017-05-08 ENCOUNTER — Ambulatory Visit: Payer: Medicare HMO

## 2017-05-13 DIAGNOSIS — M19049 Primary osteoarthritis, unspecified hand: Secondary | ICD-10-CM | POA: Diagnosis not present

## 2017-05-13 DIAGNOSIS — E559 Vitamin D deficiency, unspecified: Secondary | ICD-10-CM | POA: Diagnosis not present

## 2017-05-13 DIAGNOSIS — I480 Paroxysmal atrial fibrillation: Secondary | ICD-10-CM | POA: Diagnosis not present

## 2017-05-13 DIAGNOSIS — I471 Supraventricular tachycardia: Secondary | ICD-10-CM | POA: Diagnosis not present

## 2017-05-16 DIAGNOSIS — E7800 Pure hypercholesterolemia, unspecified: Secondary | ICD-10-CM | POA: Diagnosis not present

## 2017-05-16 DIAGNOSIS — I480 Paroxysmal atrial fibrillation: Secondary | ICD-10-CM | POA: Diagnosis not present

## 2017-05-16 DIAGNOSIS — M19049 Primary osteoarthritis, unspecified hand: Secondary | ICD-10-CM | POA: Diagnosis not present

## 2017-06-05 ENCOUNTER — Ambulatory Visit: Payer: Medicare HMO

## 2017-06-05 ENCOUNTER — Ambulatory Visit
Admission: RE | Admit: 2017-06-05 | Discharge: 2017-06-05 | Disposition: A | Discharge status: Home / Self Care | Payer: Medicare HMO | Source: Ambulatory Visit | Attending: Internal Medicine | Admitting: Internal Medicine

## 2017-06-05 DIAGNOSIS — Z139 Encounter for screening, unspecified: Secondary | ICD-10-CM

## 2017-06-05 DIAGNOSIS — Z1231 Encounter for screening mammogram for malignant neoplasm of breast: Secondary | ICD-10-CM | POA: Diagnosis not present

## 2017-07-03 ENCOUNTER — Encounter: Payer: Self-pay | Admitting: Podiatry

## 2017-07-03 ENCOUNTER — Other Ambulatory Visit: Payer: Self-pay | Admitting: Podiatry

## 2017-07-03 ENCOUNTER — Ambulatory Visit: Payer: Medicare HMO | Admitting: Podiatry

## 2017-07-03 ENCOUNTER — Ambulatory Visit: Payer: Medicare HMO

## 2017-07-03 DIAGNOSIS — M779 Enthesopathy, unspecified: Secondary | ICD-10-CM | POA: Diagnosis not present

## 2017-07-03 DIAGNOSIS — M79672 Pain in left foot: Secondary | ICD-10-CM | POA: Diagnosis not present

## 2017-07-03 DIAGNOSIS — M2041 Other hammer toe(s) (acquired), right foot: Secondary | ICD-10-CM | POA: Diagnosis not present

## 2017-07-03 DIAGNOSIS — M2040 Other hammer toe(s) (acquired), unspecified foot: Secondary | ICD-10-CM

## 2017-07-03 NOTE — Progress Notes (Signed)
Subjective:   Patient ID: Tonya Turner, female   DOB: 77 y.o.   MRN: [id number]   HPI Patient states while she is not having a lot of pain currently she is concerned about the structure of her second MPJ and is worried that ultimately this is going to get worse.  Has numerous questions concerning condition   ROS      Objective:  Physical Exam  Neurovascular status intact with rigid contracture digit to right with compression against the metatarsal and pain with palpation.  Patient also has moderate structural bunion deformity right     Assessment:  Capsulitis secondary to rigid contracture second metatarsal right with hammertoe deformity and structural bunion     Plan:  H&P condition reviewed and at this point recommended continued padding orthotics with injections to be done in approximately 2 months.  I reviewed at great length causes of deformity considerations for surgery which I do not recommend currently  X-rays do indicate significant structural malalignment of the second MPJ

## 2017-07-04 ENCOUNTER — Telehealth: Payer: Self-pay | Admitting: Podiatry

## 2017-07-04 NOTE — Telephone Encounter (Signed)
I was looking over my AVS from my visit yesterday with Dr. Paulla Dolly. I noticed there were some errors on there. There was two x-rays on here and I didn't have any and after speaking with Jocelyn Lamer she said she would take care of that. The other error was foot pain, left. I came in for my right foot and we did not address anything on my left foot. Doesn't really make much sense that appears that way. Then the capsulitis and hammertoe right foot is correct. If you could give me a call at [phone] I would appreciate it. Thanks so much. Bye.

## 2017-07-10 ENCOUNTER — Telehealth: Payer: Self-pay | Admitting: *Deleted

## 2017-07-10 ENCOUNTER — Telehealth: Payer: Self-pay

## 2017-07-10 NOTE — Telephone Encounter (Signed)
Pt called to make sure the mistakes on her 07/03/2017 AVS had been corrected and she would like a call back confirming the corrections.

## 2017-07-10 NOTE — Telephone Encounter (Signed)
LVM for informing patient of the correction in her chart

## 2017-07-15 ENCOUNTER — Other Ambulatory Visit: Payer: Self-pay | Admitting: Dermatology

## 2017-07-15 DIAGNOSIS — L820 Inflamed seborrheic keratosis: Secondary | ICD-10-CM | POA: Diagnosis not present

## 2017-07-15 DIAGNOSIS — D229 Melanocytic nevi, unspecified: Secondary | ICD-10-CM | POA: Diagnosis not present

## 2017-07-15 DIAGNOSIS — D485 Neoplasm of uncertain behavior of skin: Secondary | ICD-10-CM | POA: Diagnosis not present

## 2017-07-15 DIAGNOSIS — D225 Melanocytic nevi of trunk: Secondary | ICD-10-CM | POA: Diagnosis not present

## 2017-07-30 DIAGNOSIS — Z1389 Encounter for screening for other disorder: Secondary | ICD-10-CM | POA: Diagnosis not present

## 2017-07-30 DIAGNOSIS — Z0000 Encounter for general adult medical examination without abnormal findings: Secondary | ICD-10-CM | POA: Diagnosis not present

## 2017-07-30 DIAGNOSIS — I480 Paroxysmal atrial fibrillation: Secondary | ICD-10-CM | POA: Diagnosis not present

## 2017-07-30 DIAGNOSIS — M2041 Other hammer toe(s) (acquired), right foot: Secondary | ICD-10-CM | POA: Diagnosis not present

## 2017-07-30 DIAGNOSIS — M21611 Bunion of right foot: Secondary | ICD-10-CM | POA: Diagnosis not present

## 2017-07-30 DIAGNOSIS — I471 Supraventricular tachycardia: Secondary | ICD-10-CM | POA: Diagnosis not present

## 2017-08-04 ENCOUNTER — Other Ambulatory Visit: Payer: Self-pay | Admitting: Cardiology

## 2017-08-09 ENCOUNTER — Telehealth: Payer: Self-pay | Admitting: Internal Medicine

## 2017-08-09 NOTE — Telephone Encounter (Signed)
Returned call to the patient but there was no answer. Left detailed message on patient's VM (DPR on file) letting her know that I would let Dr. Rayann Heman and his scheduler know that she would like to move forward with the loop recorder. Let the patient know that she will need to be seen in the office since she has not been seen since October 2018. Let the patient know that she will be contacted about scheduling an appointment. Instructed for the patient to call back with any questions.

## 2017-08-09 NOTE — Telephone Encounter (Signed)
New Message   Patient is calling and she has decided that she would like to move forward with the loop recorder. Please call to discuss.

## 2017-08-10 NOTE — Telephone Encounter (Signed)
I know her well Ok to schedule loop recorder implant with me without an office visit first.  Can schedule as a 7:00 am on a day that I am in the lab.

## 2017-08-14 NOTE — Telephone Encounter (Signed)
Follow up     Pt calling to see what [facility] decided to due about getting a loop recorder. Please call

## 2017-08-15 ENCOUNTER — Telehealth: Payer: Self-pay

## 2017-08-15 NOTE — Telephone Encounter (Signed)
Call placed to Pt.  Pt was headed out the door for another appointment but she states she would like the loop recorder to be placed on March 29,2019.  Notified Pt her arrival time would be 6:30 am and would schedule for her if she would like.  Pt confirms to schedule.  Loop scheduled.  Will call Pt later this afternoon to go over instructions.

## 2017-08-15 NOTE — Telephone Encounter (Signed)
Call returned to Pt per her request.  Discussed the loop procedure.  Pt had many questions.  All questions were answered to Pt satisfaction.  Notified to call this nurse if any further needs.

## 2017-08-20 DIAGNOSIS — I480 Paroxysmal atrial fibrillation: Secondary | ICD-10-CM | POA: Diagnosis not present

## 2017-08-20 DIAGNOSIS — M19049 Primary osteoarthritis, unspecified hand: Secondary | ICD-10-CM | POA: Diagnosis not present

## 2017-08-23 ENCOUNTER — Encounter (HOSPITAL_COMMUNITY): Payer: Self-pay | Admitting: Internal Medicine

## 2017-08-23 ENCOUNTER — Encounter (HOSPITAL_COMMUNITY)
Admission: RE | Disposition: A | Discharge status: Home / Self Care | Payer: Self-pay | Source: Ambulatory Visit | Attending: Internal Medicine

## 2017-08-23 ENCOUNTER — Ambulatory Visit (HOSPITAL_COMMUNITY)
Admission: RE | Admit: 2017-08-23 | Discharge: 2017-08-23 | Disposition: A | Discharge status: Home / Self Care | Payer: Medicare HMO | Source: Ambulatory Visit | Attending: Internal Medicine | Admitting: Internal Medicine

## 2017-08-23 DIAGNOSIS — M5136 Other intervertebral disc degeneration, lumbar region: Secondary | ICD-10-CM | POA: Diagnosis not present

## 2017-08-23 DIAGNOSIS — Z96642 Presence of left artificial hip joint: Secondary | ICD-10-CM | POA: Diagnosis not present

## 2017-08-23 DIAGNOSIS — R002 Palpitations: Secondary | ICD-10-CM | POA: Insufficient documentation

## 2017-08-23 DIAGNOSIS — I480 Paroxysmal atrial fibrillation: Secondary | ICD-10-CM | POA: Diagnosis not present

## 2017-08-23 DIAGNOSIS — M5134 Other intervertebral disc degeneration, thoracic region: Secondary | ICD-10-CM | POA: Diagnosis not present

## 2017-08-23 DIAGNOSIS — I471 Supraventricular tachycardia: Secondary | ICD-10-CM | POA: Insufficient documentation

## 2017-08-23 DIAGNOSIS — I4891 Unspecified atrial fibrillation: Secondary | ICD-10-CM | POA: Diagnosis not present

## 2017-08-23 DIAGNOSIS — Z85828 Personal history of other malignant neoplasm of skin: Secondary | ICD-10-CM | POA: Diagnosis not present

## 2017-08-23 DIAGNOSIS — Z7901 Long term (current) use of anticoagulants: Secondary | ICD-10-CM | POA: Insufficient documentation

## 2017-08-23 HISTORY — PX: LOOP RECORDER INSERTION: EP1214

## 2017-08-23 SURGERY — LOOP RECORDER INSERTION
Anesthesia: LOCAL

## 2017-08-23 MED ORDER — LIDOCAINE-EPINEPHRINE 1 %-1:100000 IJ SOLN
INTRAMUSCULAR | Status: AC
  Filled 2017-08-23: qty 1

## 2017-08-23 MED ORDER — LIDOCAINE-EPINEPHRINE 1 %-1:100000 IJ SOLN
INTRAMUSCULAR | Status: DC | PRN
  Administered 2017-08-23: 10 mL

## 2017-08-23 SURGICAL SUPPLY — 2 items
LOOP REVEAL LINQSYS (Prosthesis & Implant Heart) ×1 IMPLANT
PACK LOOP INSERTION (CUSTOM PROCEDURE TRAY) ×2 IMPLANT

## 2017-08-23 NOTE — Discharge Instructions (Signed)
Implantable loop recorder instruction sheet given °

## 2017-08-23 NOTE — H&P (Signed)
PCP: Lavone Orn, MD Primary Cardiologist: Dr Marlou Porch Primary EP: Dr Rayann Heman  Tonya Turner is a 77 y.o. female who presents today for implantable loop recorder placement.  Since last being seen in our clinic, the patient reports doing very well.   She is unaware of afib. Today, she denies symptoms of palpitations, chest pain, shortness of breath,  lower extremity edema, dizziness, presyncope, or syncope.  The patient is otherwise without complaint today.       Past Medical History:  Diagnosis Date  . Arthritis    "hands-mild; left hip" (09/28/2015)  . Basal cell carcinoma of lower leg    "cut out on both shins"  . DDD (degenerative disc disease), lumbar   . Degenerative disc disease, thoracic   . Eczema    history of  . Edema of extremities    usually lower extremities- bilateral  . Family history of adverse reaction to anesthesia    "brother is allergic and gets sick"  . Migraine    rare occular migraine- none recent (09/28/2015)  . PAF (paroxysmal atrial fibrillation) ([place]) 01/30/2011   ekg from Dr Delene Ruffini office is reviewed and confirms afib  . Pneumonia 1940s  . Primary osteoarthritis of left hip   . Unsteady gait   . Varicose vein    hx. of-         Past Surgical History:  Procedure Laterality Date  . BASAL CELL CARCINOMA EXCISION Bilateral    "shins"  . COLONOSCOPY WITH PROPOFOL N/A 10/28/2012   Procedure: COLONOSCOPY WITH PROPOFOL;  Surgeon: Garlan Fair, MD;  Location: [facility];  Service: Endoscopy;  Laterality: N/A;  . DILATION AND CURETTAGE OF UTERUS    . JOINT REPLACEMENT    . TOTAL HIP ARTHROPLASTY Left 09/27/2015  . TOTAL HIP ARTHROPLASTY Left 09/27/2015   Procedure: TOTAL HIP ARTHROPLASTY ANTERIOR APPROACH;  Surgeon: Melrose Nakayama, MD;  Location: [place];  Service: Orthopedics;  Laterality: Left;  . TUBAL LIGATION  1970s  . VARICOSE VEIN SURGERY Left 1970s    ROS- all systems are reviewed and negatives except as per HPI  above        Current Outpatient Prescriptions  Medication Sig Dispense Refill  . Biotin 1 MG CAPS Take 2 capsules by mouth daily.     . calcium-vitamin D (OSCAL WITH D) 500-200 MG-UNIT per tablet Take 1 tablet by mouth daily.    Marland Kitchen conjugated estrogens (PREMARIN) vaginal cream Place 1 Applicatorful vaginally 2 (two) times a week. No specific day    . diltiazem (CARDIZEM CD) 240 MG 24 hr capsule Take 1 capsule (240 mg total) by mouth every morning. 90 capsule 0  . diltiazem (CARDIZEM) 30 MG tablet Take 1 tablet (30 mg total) by mouth every 6 (six) hours as needed (for rapid heart beat). 60 tablet 10  . flecainide (TAMBOCOR) 50 MG tablet Take 1 tablet (50 mg total) by mouth 2 (two) times daily. 180 tablet 0  . glucosamine-chondroitin 500-400 MG tablet Take 0.5 tablets by mouth daily.     . Multiple Vitamins-Minerals (CENTRUM ADULTS PO) Take 0.5 tablets by mouth daily. [employer] brand    . triamcinolone cream (KENALOG) 0.1 % Apply 1 application topically daily as needed. For rash and itching    . XARELTO 20 MG TABS tablet TAKE 1 TABLET (20 MG TOTAL) BY MOUTH DAILY WITH SUPPER. 90 tablet 1   No current facility-administered medications for this visit.     Physical Exam:    Vitals:   03/27/17  1607  BP: 114/66  Pulse: 68  SpO2: 98%  Weight: 157 lb (71.2 kg)  Height: 5\' 5"  (1.651 m)    GEN- The patient is well appearing, alert and oriented x 3 today.   Head- normocephalic, atraumatic Eyes-  Sclera clear, conjunctiva pink Ears- hearing intact Oropharynx- clear Lungs- Clear to ausculation bilaterally, normal work of breathing Heart- Regular rate and rhythm, no murmurs, rubs or gallops, PMI not laterally displaced GI- soft, NT, ND, + BS Extremities- no clubbing, cyanosis, or edema  EKG tracing ordered today is personally reviewed and shows sinus rhythm 68 bpm, p wave fractionation, otherwise normal ekg  Assessment and Plan:  1. SVT Well controlled  2.  afib Well controlled (only a single documented episode in 2012) chads2vasc score is 3.  She wishes to stop xarelto. We have discussed implantable loop recorder for further evaluation of palpitations and for afib management.  If no afib, would be able to avoid anticoagulation going forward. Risks and benefits to ILR were discussed with the patient who wishes to proceed at this time.  Thompson Grayer MD, [facility] 08/23/2017 7:39 AM

## 2017-08-28 ENCOUNTER — Ambulatory Visit: Payer: Medicare HMO | Admitting: Podiatry

## 2017-08-29 ENCOUNTER — Telehealth: Payer: Self-pay | Admitting: Internal Medicine

## 2017-08-29 NOTE — Telephone Encounter (Signed)
Call returned to Pt.  Per Pt [employer] called her to let her know her loop implant she had on 08/23/2017 claim had been denied d/t lack of supporting evidence.  Pt states they said they only had a note from Dr. Rayann Heman from March, which was before any discussion of loop implant.  Sending message to precert-will follow.

## 2017-08-29 NOTE — Telephone Encounter (Signed)
Mrs. Fahey is calling because her insurance called to let her know that they were denying the placement of her loop recorder that was done last Friday. States the documentation did not give a reason to why she is needing the loop recorder . Please call u  Thanks

## 2017-09-02 ENCOUNTER — Telehealth: Payer: Self-pay | Admitting: Nurse Practitioner

## 2017-09-02 DIAGNOSIS — I480 Paroxysmal atrial fibrillation: Secondary | ICD-10-CM | POA: Diagnosis not present

## 2017-09-02 DIAGNOSIS — M19049 Primary osteoarthritis, unspecified hand: Secondary | ICD-10-CM | POA: Diagnosis not present

## 2017-09-02 NOTE — Telephone Encounter (Signed)
   [facility] Pre-operative Risk Assessment    Request for surgical clearance:  1. What type of surgery is being performed? Colonoscopy   2. When is this surgery scheduled? September 18, 2017  3. What type of clearance is required (medical clearance vs. Pharmacy clearance to hold med vs. Both)? Pharmacy  4. Are there any medications that need to be held prior to surgery and how long? Xarelto - request to temporarily stop Xarelto before procedure and after the procedure if polyps are removed  5. Practice name and name of physician performing surgery? [facility]  6. What is your office phone and fax number? P: [phone], F: [phone]   7. Anesthesia type (None, local, MAC, general) ? Not Indicated   Emmaline Life 09/02/2017, 10:53 AM  _________________________________________________________________   (provider comments below)

## 2017-09-03 DIAGNOSIS — Z8619 Personal history of other infectious and parasitic diseases: Secondary | ICD-10-CM | POA: Diagnosis not present

## 2017-09-03 DIAGNOSIS — N83201 Unspecified ovarian cyst, right side: Secondary | ICD-10-CM | POA: Diagnosis not present

## 2017-09-03 DIAGNOSIS — N952 Postmenopausal atrophic vaginitis: Secondary | ICD-10-CM | POA: Diagnosis not present

## 2017-09-03 DIAGNOSIS — Z9189 Other specified personal risk factors, not elsewhere classified: Secondary | ICD-10-CM | POA: Diagnosis not present

## 2017-09-03 NOTE — Telephone Encounter (Signed)
Will route to pharmacy for preop anticoagulation recommendation.

## 2017-09-04 ENCOUNTER — Ambulatory Visit (INDEPENDENT_AMBULATORY_CARE_PROVIDER_SITE_OTHER): Payer: Self-pay | Admitting: *Deleted

## 2017-09-04 DIAGNOSIS — I480 Paroxysmal atrial fibrillation: Secondary | ICD-10-CM

## 2017-09-04 NOTE — Telephone Encounter (Signed)
Patient with diagnosis of atrial fibrillation on Xarelto for anticoagulation.    Procedure: clonoscopy Date of procedure: 09/18/17  CHADS2-VASc score of  3 (, AGE, , AGE, female)  CrCl 59.5  Platelet count 337  Per office protocol, patient can hold Xarelto for 1 days prior to procedure.    Patient should restart Xarelto on the evening of procedure or day after, at discretion of procedure MD

## 2017-09-05 NOTE — Telephone Encounter (Signed)
See attached, about xarelto.

## 2017-09-06 LAB — CUP PACEART INCLINIC DEVICE CHECK
Date Time Interrogation Session: 20190412080424
MDC IDC PG IMPLANT DT: 20190329

## 2017-09-06 NOTE — Progress Notes (Signed)
Wound check appointment. Steri-strips removed at home. Wound without redness or edema. Incision edges approximated, wound well healed. Loop check in clinic. Battery status: good. R-waves 0.77mV. 0 symptom episodes, 0 tachy episodes, 0 pause episodes, 0 brady episodes. 0 AF episodes. Monthly summary reports and ROV with JA 7/1

## 2017-09-06 NOTE — Addendum Note (Signed)
Addended by: Jimmey Ralph on: 09/06/2017 08:05 AM   Modules accepted: Level of Service

## 2017-09-17 ENCOUNTER — Telehealth: Payer: Self-pay

## 2017-09-17 NOTE — Telephone Encounter (Signed)
Incoming call-Pt returning call to state she has not been contacted by [facility] notifying her of approval yet.  Will cont to monitor.

## 2017-09-17 NOTE — Telephone Encounter (Signed)
Left message asking if Pt had heard back from her insurance about her loop coverage. Requested call back to notify if loop is being covered (no further action) or if she has not heard back (follow up). Left this nurse name and # for call back.

## 2017-09-25 ENCOUNTER — Ambulatory Visit (INDEPENDENT_AMBULATORY_CARE_PROVIDER_SITE_OTHER): Payer: Medicare HMO | Admitting: *Deleted

## 2017-09-25 DIAGNOSIS — I480 Paroxysmal atrial fibrillation: Secondary | ICD-10-CM | POA: Diagnosis not present

## 2017-09-25 NOTE — Progress Notes (Signed)
Carelink Summary Report / Loop Recorder 

## 2017-09-30 DIAGNOSIS — M19049 Primary osteoarthritis, unspecified hand: Secondary | ICD-10-CM | POA: Diagnosis not present

## 2017-09-30 DIAGNOSIS — I480 Paroxysmal atrial fibrillation: Secondary | ICD-10-CM | POA: Diagnosis not present

## 2017-10-10 ENCOUNTER — Telehealth: Payer: Self-pay | Admitting: Cardiology

## 2017-10-10 NOTE — Telephone Encounter (Signed)
LMOVM requesting that pt send manual transmission b/c home monitor has not updated in at least 14 days.    

## 2017-10-16 DIAGNOSIS — M25552 Pain in left hip: Secondary | ICD-10-CM | POA: Diagnosis not present

## 2017-10-21 LAB — CUP PACEART REMOTE DEVICE CHECK
Date Time Interrogation Session: 20190501113541
Implantable Pulse Generator Implant Date: 20190329

## 2017-10-22 ENCOUNTER — Telehealth: Payer: Self-pay | Admitting: Internal Medicine

## 2017-10-22 ENCOUNTER — Telehealth: Payer: Self-pay

## 2017-10-22 NOTE — Telephone Encounter (Signed)
Call placed to Pt.  Notified that we are re appealing denial of loop recorder.  Should have resolution within 30 days. Pt thanked me for the call. Will continue to monitor.

## 2017-10-22 NOTE — Telephone Encounter (Signed)
Claim # [id number] for loop recorder. Appeal re submitted to [facility] @ [address] [address] @ [employer]  [place] [id number].    Can call [phone] to check the status of appeal within 30 days.

## 2017-10-23 DIAGNOSIS — K6289 Other specified diseases of anus and rectum: Secondary | ICD-10-CM | POA: Diagnosis not present

## 2017-10-23 DIAGNOSIS — K5730 Diverticulosis of large intestine without perforation or abscess without bleeding: Secondary | ICD-10-CM | POA: Diagnosis not present

## 2017-10-23 DIAGNOSIS — K635 Polyp of colon: Secondary | ICD-10-CM | POA: Diagnosis not present

## 2017-10-23 DIAGNOSIS — D126 Benign neoplasm of colon, unspecified: Secondary | ICD-10-CM | POA: Diagnosis not present

## 2017-10-23 DIAGNOSIS — Z86010 Personal history of colonic polyps: Secondary | ICD-10-CM | POA: Diagnosis not present

## 2017-10-25 DIAGNOSIS — D126 Benign neoplasm of colon, unspecified: Secondary | ICD-10-CM | POA: Diagnosis not present

## 2017-10-25 DIAGNOSIS — K635 Polyp of colon: Secondary | ICD-10-CM | POA: Diagnosis not present

## 2017-10-28 ENCOUNTER — Ambulatory Visit (INDEPENDENT_AMBULATORY_CARE_PROVIDER_SITE_OTHER): Payer: Medicare HMO | Admitting: *Deleted

## 2017-10-28 DIAGNOSIS — I480 Paroxysmal atrial fibrillation: Secondary | ICD-10-CM | POA: Diagnosis not present

## 2017-10-29 ENCOUNTER — Telehealth: Payer: Self-pay | Admitting: Cardiology

## 2017-10-29 NOTE — Telephone Encounter (Signed)
LMOVM requesting that pt send manual transmission b/c home monitor has not updated in at least 14 days.    

## 2017-10-29 NOTE — Progress Notes (Signed)
Carelink Summary Report / Loop Recorder 

## 2017-11-25 ENCOUNTER — Ambulatory Visit: Payer: Medicare HMO | Admitting: Internal Medicine

## 2017-11-25 ENCOUNTER — Encounter: Payer: Self-pay | Admitting: Internal Medicine

## 2017-11-25 VITALS — BP 126/72 | HR 68 | Ht 65.0 in | Wt 150.0 lb

## 2017-11-25 DIAGNOSIS — I480 Paroxysmal atrial fibrillation: Secondary | ICD-10-CM | POA: Diagnosis not present

## 2017-11-25 DIAGNOSIS — I471 Supraventricular tachycardia: Secondary | ICD-10-CM | POA: Diagnosis not present

## 2017-11-25 NOTE — Patient Instructions (Signed)
Medication Instructions:  Your physician has recommended you make the following change in your medication:  1. STOP Xarelto  *If you need a refill on your cardiac medications before your next appointment, please call your pharmacy*  Labwork: None ordered  Testing/Procedures: None ordered  Follow-Up: Remote monitoring is used to monitor your Pacemaker or ICD from home. This monitoring reduces the number of office visits required to check your device to one time per year. It allows [place] to keep an eye on the functioning of your device to ensure it is working properly. You are scheduled for a device check from home on 12/02/2017. You may send your transmission at any time that day. If you have a wireless device, the transmission will be sent automatically. After your physician reviews your transmission, you will receive a postcard with your next transmission date.  Your physician recommends that you schedule a follow-up appointment in: 6 months with Dr. Rayann Heman.  Thank you for choosing [facility]!!

## 2017-11-25 NOTE — Progress Notes (Signed)
PCP: Lavone Orn, MD Primary Cardiologist: Dr Marlou Porch Primary EP: Dr Rayann Heman  Tonya Turner is a 77 y.o. female who presents today for routine electrophysiology followup.  Since last being seen in our clinic, the patient reports doing very well.  Today, she denies symptoms of palpitations, chest pain, shortness of breath,  lower extremity edema, dizziness, presyncope, or syncope.  The patient is otherwise without complaint today.   Past Medical History:  Diagnosis Date  . Arthritis    "hands-mild; left hip" (09/28/2015)  . Basal cell carcinoma of lower leg    "cut out on both shins"  . DDD (degenerative disc disease), lumbar   . Degenerative disc disease, thoracic   . Eczema    history of  . Edema of extremities    usually lower extremities- bilateral  . Family history of adverse reaction to anesthesia    "brother is allergic and gets sick"  . Migraine    rare occular migraine- none recent (09/28/2015)  . PAF (paroxysmal atrial fibrillation) ([place]) 01/30/2011   ekg from Dr Delene Ruffini office is reviewed and confirms afib  . Pneumonia 1940s  . Primary osteoarthritis of left hip   . Unsteady gait   . Varicose vein    hx. of-    Past Surgical History:  Procedure Laterality Date  . BASAL CELL CARCINOMA EXCISION Bilateral    "shins"  . COLONOSCOPY WITH PROPOFOL N/A 10/28/2012   Procedure: COLONOSCOPY WITH PROPOFOL;  Surgeon: Garlan Fair, MD;  Location: [facility];  Service: Endoscopy;  Laterality: N/A;  . DILATION AND CURETTAGE OF UTERUS    . JOINT REPLACEMENT    . LOOP RECORDER INSERTION N/A 08/23/2017   Procedure: LOOP RECORDER INSERTION;  Surgeon: Thompson Grayer, MD;  Location: [place] [facility];  Service: Cardiovascular;  Laterality: N/A;  . TOTAL HIP ARTHROPLASTY Left 09/27/2015  . TOTAL HIP ARTHROPLASTY Left 09/27/2015   Procedure: TOTAL HIP ARTHROPLASTY ANTERIOR APPROACH;  Surgeon: Melrose Nakayama, MD;  Location: [place];  Service: Orthopedics;  Laterality: Left;  . TUBAL  LIGATION  1970s  . VARICOSE VEIN SURGERY Left 1970s    ROS- all systems are reviewed and negatives except as per HPI above  Current Outpatient Medications  Medication Sig Dispense Refill  . Biotin w/ Vitamins C & E (HAIR/SKIN/NAILS PO) Take 1 tablet by mouth 2 (two) times daily.    . Calcium-Magnesium-Zinc (CAL-MAG-ZINC PO) Take 1 tablet by mouth daily with lunch.    . conjugated estrogens (PREMARIN) vaginal cream Place 1 Applicatorful vaginally 2 (two) times a week. Saturday & Tuesday.    . diltiazem (CARDIZEM CD) 240 MG 24 hr capsule TAKE 1 CAPSULE (240 MG TOTAL) BY MOUTH EVERY MORNING. 90 capsule 1  . diltiazem (CARDIZEM) 30 MG tablet Take 1 tablet (30 mg total) by mouth every 6 (six) hours as needed (for rapid heart beat). 60 tablet 10  . flecainide (TAMBOCOR) 50 MG tablet Take 1 tablet (50 mg total) by mouth 2 (two) times daily. 180 tablet 1  . Glucosamine-Chondroitin (COSAMIN DS PO) Take 0.5 tablets by mouth daily at 3 pm.    . Multiple Vitamin (MULTIVITAMIN WITH MINERALS) TABS tablet Take 1 tablet by mouth daily at 3 pm.    . rivaroxaban (XARELTO) 20 MG TABS tablet Take 1 tablet (20 mg total) by mouth daily with supper. 90 tablet 3  . triamcinolone cream (KENALOG) 0.1 % Apply 1 application topically daily as needed (for skin irritation/rash).      No current facility-administered  medications for this visit.     Physical Exam: Vitals:   11/25/17 1512  BP: 126/72  Pulse: 68  Weight: 150 lb (68 kg)  Height: 5\' 5"  (1.651 m)    GEN- The patient is well appearing, alert and oriented x 3 today.   Head- normocephalic, atraumatic Eyes-  Sclera clear, conjunctiva pink Ears- hearing intact Oropharynx- clear Lungs- Clear to ausculation bilaterally, normal work of breathing Heart- Regular rate and rhythm, no murmurs, rubs or gallops, PMI not laterally displaced GI- soft, NT, ND, + BS Extremities- no clubbing, cyanosis, or edema  Wt Readings from Last 3 Encounters:  11/25/17 150 lb  (68 kg)  08/23/17 152 lb (68.9 kg)  03/27/17 157 lb (71.2 kg)    EKG tracing ordered today is personally reviewed and shows sinus rhythm 68 bpm, PR 192 msec, QRS 84 msec, Qtc 433 msec, biatrial enlargement  Assessment and Plan:  1. Paroxysmal atrial fibrillation chads2vasc score is 3. She has had no afib since ILR implantation in march. She wishes to stop anticoagulation at this time.  She is aware that there is an increase in stroke risk with this approach, particularly when she travels overseas in a few months and will not be monitored. If her afib returns detected, we will plan to resume anticoagulation at that time.  2. SVT Well controlled  Continue remote monitoring Follow-up with Dr Marlou Porch as scheduled I will see again in 6 months  Thompson Grayer MD, [facility] 11/25/2017 3:42 PM

## 2017-11-26 LAB — CUP PACEART INCLINIC DEVICE CHECK
Date Time Interrogation Session: 20190701192602
Implantable Pulse Generator Implant Date: 20190329

## 2017-12-02 ENCOUNTER — Ambulatory Visit (INDEPENDENT_AMBULATORY_CARE_PROVIDER_SITE_OTHER): Payer: Medicare HMO | Admitting: *Deleted

## 2017-12-02 DIAGNOSIS — I480 Paroxysmal atrial fibrillation: Secondary | ICD-10-CM

## 2017-12-03 NOTE — Progress Notes (Signed)
Carelink Summary Report / Loop Recorder 

## 2017-12-04 DIAGNOSIS — N83202 Unspecified ovarian cyst, left side: Secondary | ICD-10-CM | POA: Diagnosis not present

## 2017-12-04 DIAGNOSIS — N83201 Unspecified ovarian cyst, right side: Secondary | ICD-10-CM | POA: Diagnosis not present

## 2017-12-04 LAB — CUP PACEART REMOTE DEVICE CHECK
Implantable Pulse Generator Implant Date: 20190329
MDC IDC SESS DTM: 20190603121123

## 2017-12-10 ENCOUNTER — Telehealth: Payer: Self-pay

## 2017-12-10 ENCOUNTER — Telehealth: Payer: Self-pay | Admitting: Internal Medicine

## 2017-12-10 NOTE — Telephone Encounter (Signed)
Error

## 2017-12-10 NOTE — Telephone Encounter (Signed)
Call received by someone named "Tammie".  Per review of Pt DPR no one named Tammie is listed.  Call returned to Pt.  Advised someone named Tammie had called on her behalf (?) regarding Xarelto vs. Aspirin.  Advised Pt to call office herself if she has any questions/concerns d/t this nurse cannot speak to Tammie.  Left this nurse name and # for call back.

## 2017-12-10 NOTE — Telephone Encounter (Signed)
New Message:     Pt c/o medication issue:  1. Name of Medication: Xarelto  2. How are you currently taking this medication (dosage and times per day)?   3. Are you having a reaction (difficulty breathing--STAT)? No  4. What is your medication issue? Tammie would like to know if the pt needs to take a 81 mg Aspirin. She states the pt ask if she is planning to travel should she take a xarelto before she leaves or just take and aspirin

## 2017-12-10 NOTE — Telephone Encounter (Signed)
Call back received from Pt.  Tonya Turner is from Pt's PCP and ok to discuss Pt information.  Pt would like to know if she should start ASA now that she is off Xarelto.  Would also like to know if she should take a few doses when she is flying.  Will discuss with Dr. Marlou Porch.

## 2017-12-12 NOTE — Telephone Encounter (Signed)
Per Dr. Marlou Porch- 81 mg ASA will not decrease Pt's risk of stroke d/t afib. Also-we do not order Xarelto for a few doses for traveling.  Left CVM for Tammie Eckerd with [facility] [place]. Advised of above.  Requested call back if any further questions.

## 2017-12-13 DIAGNOSIS — H00014 Hordeolum externum left upper eyelid: Secondary | ICD-10-CM | POA: Diagnosis not present

## 2017-12-16 DIAGNOSIS — N9489 Other specified conditions associated with female genital organs and menstrual cycle: Secondary | ICD-10-CM | POA: Diagnosis not present

## 2017-12-20 DIAGNOSIS — H0014 Chalazion left upper eyelid: Secondary | ICD-10-CM | POA: Diagnosis not present

## 2017-12-28 IMAGING — MR MR HIP*L* W/O CM
4 of 5 series · 26 of 40 positions shown · non-contrast
Comparison: 07/07/2015

CLINICAL DATA: Left hip pain and discomfort.

EXAM:
MR OF THE LEFT HIP WITHOUT CONTRAST
TECHNIQUE: Multiplanar, multisequence MR imaging was performed. No intravenous
contrast was administered.

[Series 8: STIR · coronal · left · 3.0mm · 1.25mm/px · 3 of 25 slices shown]
[im 5/25]
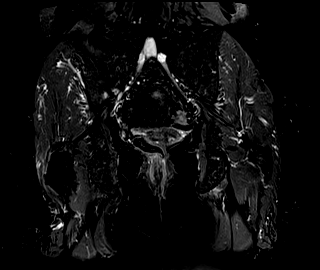
[im 15/25]
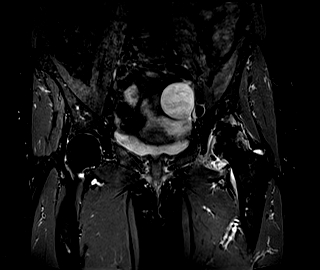
[im 25/25]
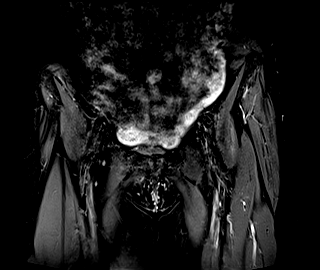

[Series 9: T1 · coronal · left · 3.0mm · 0.78mm/px · 8 of 30 slices shown (1 of 2)]
[im 1/30]
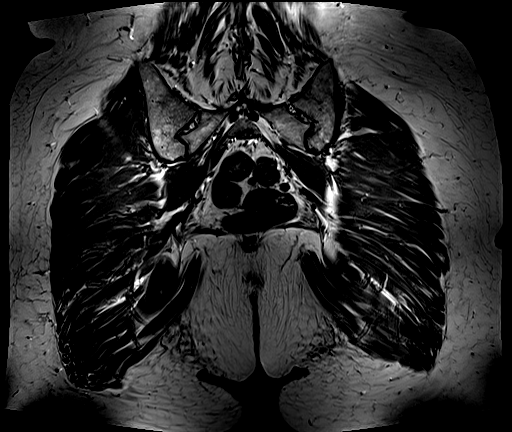
[im 5/30]
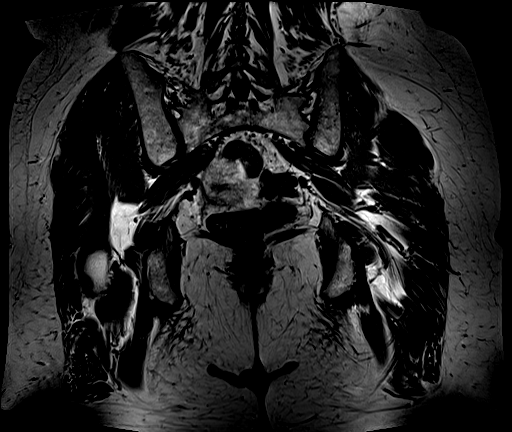
[im 9/30]
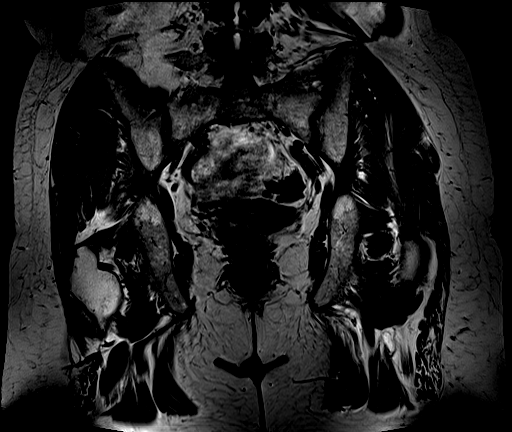
[im 13/30]
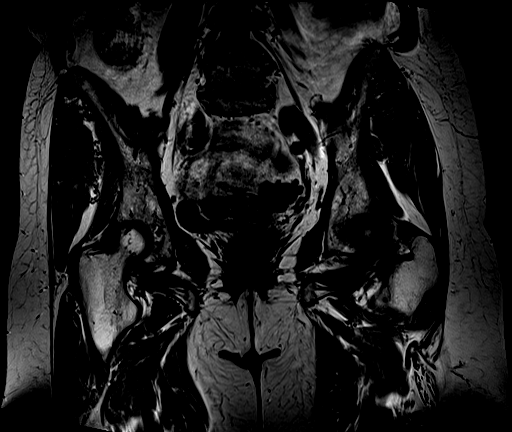
[im 17/30]
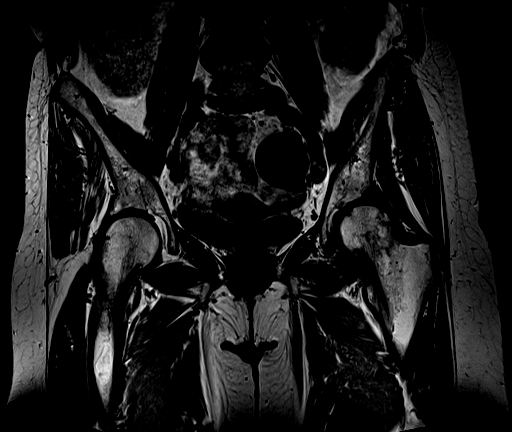
[im 21/30]
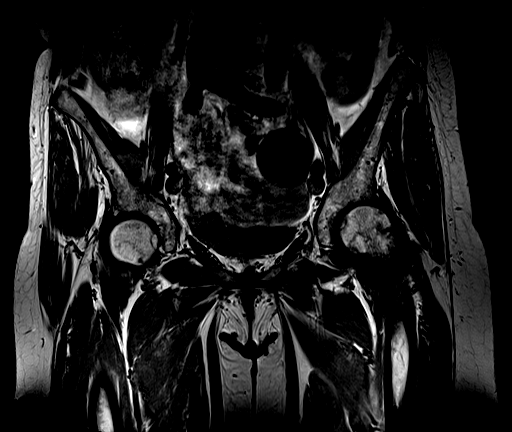
[im 25/30]
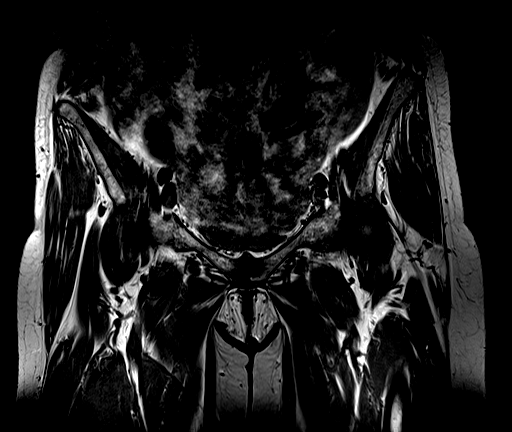
[im 30/30]
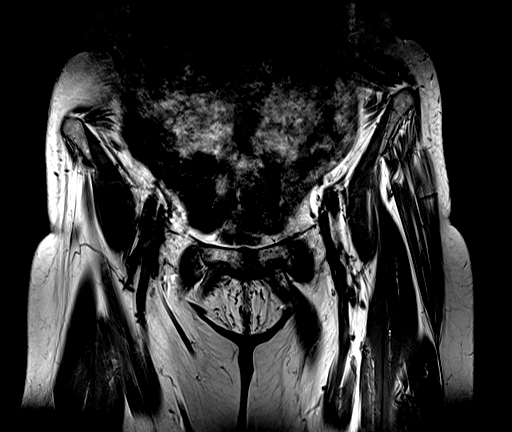

[Series 10: T1 · axial · left · 3.0mm · 0.78mm/px · z∈[-66,+42]mm · 7 of 36 slices shown (2 of 2)]
[im 1/36]
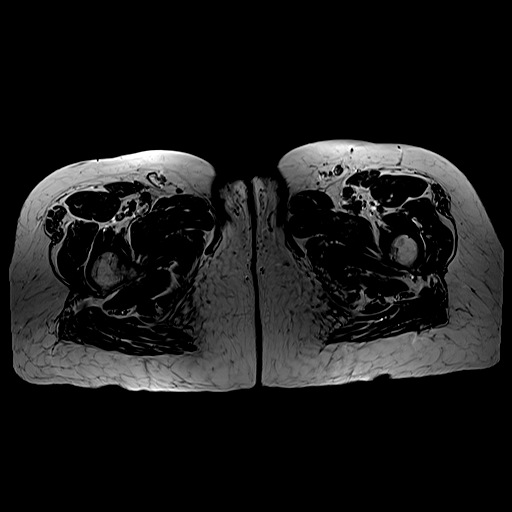
[im 5/36]
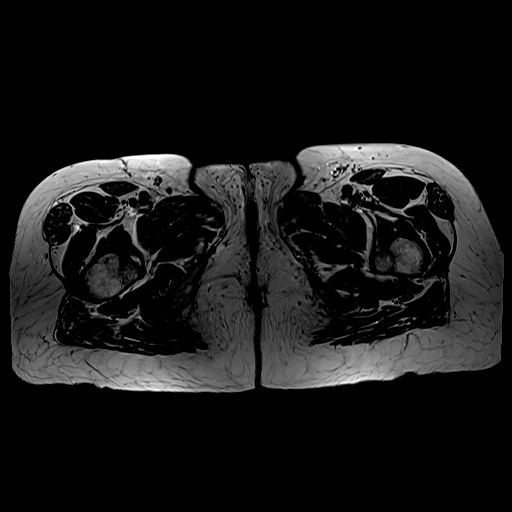
[im 9/36]
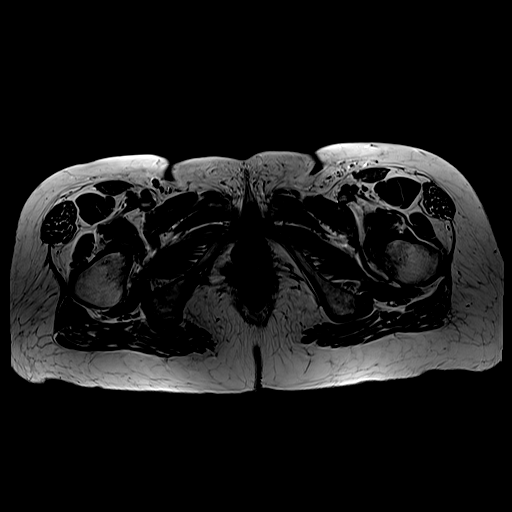
[im 14/36]
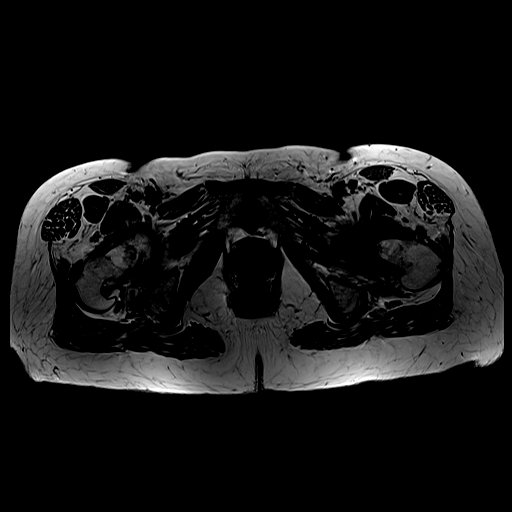
[im 18/36]
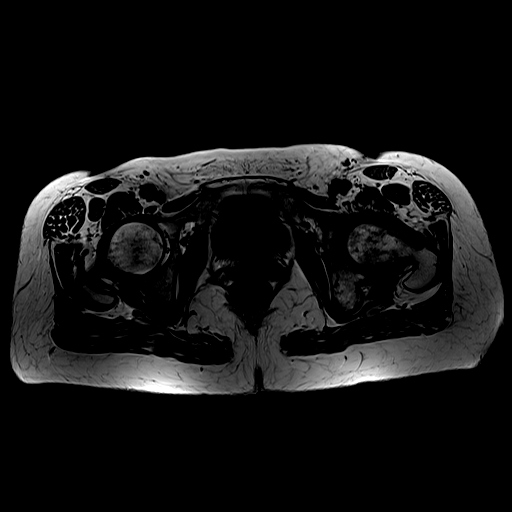
[im 22/36]
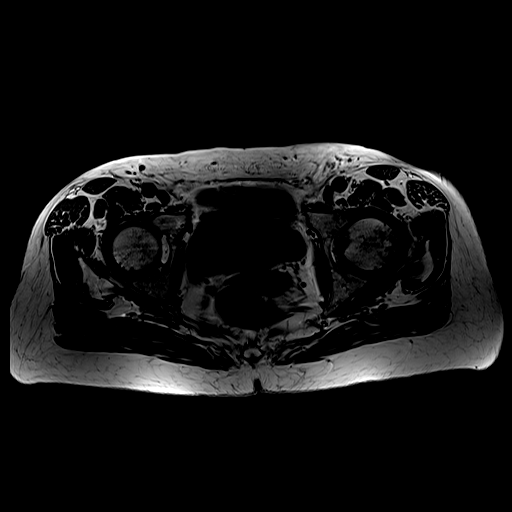
[im 31/36]
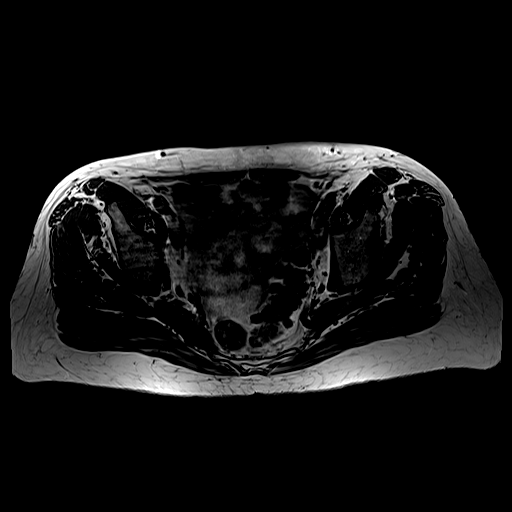

[Series 12: PD · sagittal · left · 3.0mm · 0.62mm/px · 8 of 30 slices shown]
[im 1/30]
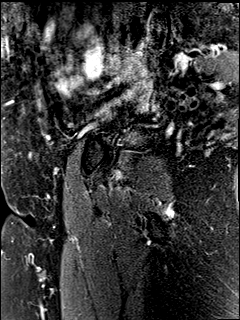
[im 5/30]
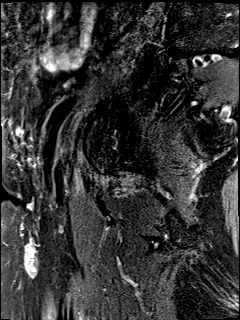
[im 9/30]
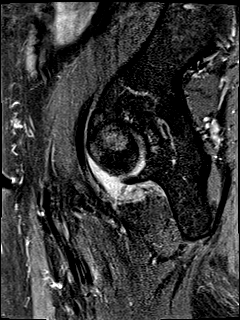
[im 13/30]
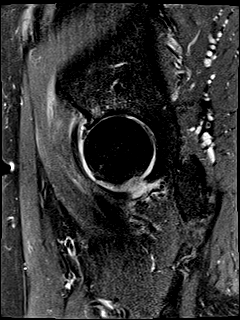
[im 17/30]
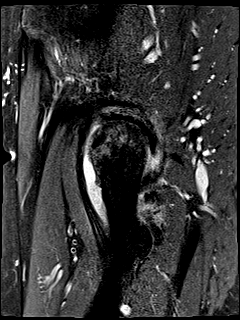
[im 21/30]
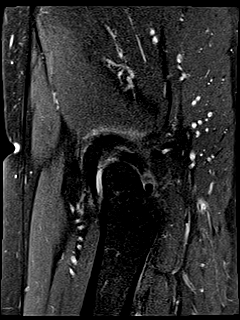
[im 25/30]
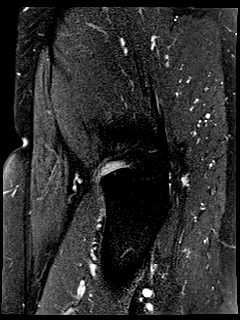
[im 30/30]
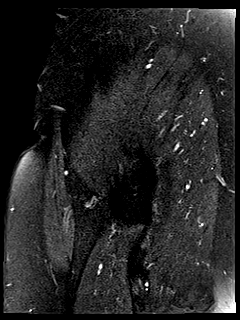

[26 of 40 positions shown; findings below may reference images not displayed]

FINDINGS: Bones: Low-grade marrow edema laterally in the left femoral head,
adjacent to the foot via, and in the lateral portion of the left
acetabulum. This marrow edema is patchy and indistinct.

Mild spurring of both femoral heads. Sclerosis and edema in the
pubic bodies compatible with osteitis pubis. There is mild spurring
of the pubic bodies.

Articular cartilage and labrum

Articular cartilage: Mild degenerative chondral thinning in both
hips.

Labrum: Linear tear of the anterior superior acetabular labrum,
images 10-14 series 12.

Joint or bursal effusion

Joint effusion: Left hip joint effusion, accompanied by low-grade
edema tracking along the left hip adductor musculature and adjacent
to the left distal iliopsoas. Hazy edema in the soft tissues along
the capsular margin.

Bursae:  No regional bursitis identified.

Muscles and tendons

Muscles and tendons: Chronic appearing tendinopathy of both
hamstring tendons proximally, slightly greater on the left than the
right. As noted above there is edema tracking in the left hip
adductor musculature.

Other findings

Miscellaneous: Sigmoid diverticulosis. Cystic lesion in the left
hemipelvis, 4.4 cm, probably associated with the left ovary.
IMPRESSION: 1. There is a left hip joint effusion, with low-level edema in the
soft tissues along the capsular margin, and mild edema tracking into
the left hip adductor musculature. There is also some patchy marrow
edema along peripheral focal portions of the left femoral head, as
well as edema laterally in the left acetabulum. I do not see
specific findings of avascular necrosis at this time. Differential
diagnostic considerations include erosive arthropathy such as
rheumatoid, [HOSPITAL] deposition disease, or septic joint. Correlate
with any fever/leukocytosis in determining whether arthrocentesis is
warranted.
2. Torn anterior superior acetabular labrum on the left.
3. Mild degenerative chondral thinning in both hips, with mild
associated spurring.
4. 4.3 cm cystic lesion believed to be associated with the left
ovary. Patient presumed to be late postmenopausal based on age. The
lesion appears homogeneous and fluid signal intensity. Based on
these criteria, pelvic ultrasound is recommended for further workup.
This recommendation follows ACR consensus guidelines: White Paper of
the ACR Incidental Findings Committee II on Adnexal Findings. [HOSPITAL] [DATE].

## 2018-01-02 ENCOUNTER — Ambulatory Visit (INDEPENDENT_AMBULATORY_CARE_PROVIDER_SITE_OTHER): Payer: Medicare HMO | Admitting: *Deleted

## 2018-01-02 DIAGNOSIS — I480 Paroxysmal atrial fibrillation: Secondary | ICD-10-CM

## 2018-01-03 NOTE — Progress Notes (Signed)
Carelink Summary Report / Loop Recorder 

## 2018-01-04 LAB — CUP PACEART REMOTE DEVICE CHECK
Implantable Pulse Generator Implant Date: 20190329
MDC IDC SESS DTM: 20190706123712

## 2018-01-22 ENCOUNTER — Encounter: Payer: Self-pay | Admitting: Podiatry

## 2018-01-22 ENCOUNTER — Ambulatory Visit: Payer: Medicare HMO | Admitting: Podiatry

## 2018-01-22 DIAGNOSIS — M21619 Bunion of unspecified foot: Secondary | ICD-10-CM

## 2018-01-22 DIAGNOSIS — M779 Enthesopathy, unspecified: Secondary | ICD-10-CM | POA: Diagnosis not present

## 2018-01-22 DIAGNOSIS — M2041 Other hammer toe(s) (acquired), right foot: Secondary | ICD-10-CM | POA: Diagnosis not present

## 2018-01-22 MED ORDER — TRIAMCINOLONE ACETONIDE 10 MG/ML IJ SUSP
10.0000 mg | Freq: Once | INTRAMUSCULAR | Status: AC
  Administered 2018-01-22: 10 mg

## 2018-01-23 ENCOUNTER — Other Ambulatory Visit: Payer: Self-pay | Admitting: *Deleted

## 2018-01-23 MED ORDER — DILTIAZEM HCL ER COATED BEADS 240 MG PO CP24: 240.0000 mg | ORAL_CAPSULE | Freq: Every morning | ORAL | 0 refills | Status: DC

## 2018-01-23 MED ORDER — FLECAINIDE ACETATE 50 MG PO TABS: 50.0000 mg | ORAL_TABLET | Freq: Two times a day (BID) | ORAL | 0 refills | Status: DC

## 2018-01-23 NOTE — Progress Notes (Signed)
Subjective:   Patient ID: Tonya Turner, female   DOB: 77 y.o.   MRN: [id number]   HPI Patient presents stating having a lot of pain in the second joint and knows that he needs a new injection as it is done well in the past   ROS      Objective:  Physical Exam  Neurovascular status intact with inflammation of the second MPJ right with fluid buildup     Assessment:  Inflammatory capsulitis second MPJ right     Plan:  Proximal nerve block administered 60 mill grams like Marcaine mixture sterile prep applied aspirated second MPJ getting out a small amount of clear fluid injected quarter cc Dexasone Kenalog and advised on reduced activity

## 2018-02-04 ENCOUNTER — Ambulatory Visit (INDEPENDENT_AMBULATORY_CARE_PROVIDER_SITE_OTHER): Payer: Medicare HMO | Admitting: *Deleted

## 2018-02-04 DIAGNOSIS — I480 Paroxysmal atrial fibrillation: Secondary | ICD-10-CM

## 2018-02-05 DIAGNOSIS — K130 Diseases of lips: Secondary | ICD-10-CM | POA: Diagnosis not present

## 2018-02-05 DIAGNOSIS — L821 Other seborrheic keratosis: Secondary | ICD-10-CM | POA: Diagnosis not present

## 2018-02-05 DIAGNOSIS — D229 Melanocytic nevi, unspecified: Secondary | ICD-10-CM | POA: Diagnosis not present

## 2018-02-05 NOTE — Progress Notes (Signed)
Carelink Summary Report / Loop Recorder 

## 2018-02-07 ENCOUNTER — Encounter: Payer: Self-pay | Admitting: Cardiology

## 2018-02-07 ENCOUNTER — Ambulatory Visit: Payer: Medicare HMO | Admitting: Cardiology

## 2018-02-07 VITALS — BP 120/82 | HR 76 | Ht 65.0 in | Wt 147.1 lb

## 2018-02-07 DIAGNOSIS — I480 Paroxysmal atrial fibrillation: Secondary | ICD-10-CM | POA: Diagnosis not present

## 2018-02-07 DIAGNOSIS — I471 Supraventricular tachycardia: Secondary | ICD-10-CM | POA: Diagnosis not present

## 2018-02-07 MED ORDER — FLECAINIDE ACETATE 50 MG PO TABS: 50.0000 mg | ORAL_TABLET | Freq: Two times a day (BID) | ORAL | 3 refills | Status: DC

## 2018-02-07 MED ORDER — FLECAINIDE ACETATE 50 MG PO TABS: 50.0000 mg | ORAL_TABLET | Freq: Two times a day (BID) | ORAL | 0 refills | Status: DC

## 2018-02-07 MED ORDER — DILTIAZEM HCL ER COATED BEADS 240 MG PO CP24: 240.0000 mg | ORAL_CAPSULE | Freq: Every morning | ORAL | 3 refills | Status: DC

## 2018-02-07 MED ORDER — DILTIAZEM HCL ER COATED BEADS 240 MG PO CP24: 240.0000 mg | ORAL_CAPSULE | Freq: Every morning | ORAL | 0 refills | Status: DC

## 2018-02-07 NOTE — Patient Instructions (Signed)
Medication Instructions:  The current medical regimen is effective;  continue present plan and medications.  Follow-Up: Follow up in 2 years with Dr. Marlou Porch.  You will receive a letter in the mail 2 months before you are due.  Please call us when you receive this letter to schedule your follow up appointment.  If you need a refill on your cardiac medications before your next appointment, please call your pharmacy.  Thank you for choosing [facility]!!

## 2018-02-07 NOTE — Progress Notes (Signed)
Cardiology Office Note    Date:  02/07/2018   ID:  [name], DOB 23-Sep-1940, MRN [id number]  PCP:  Tonya Orn, MD  Cardiologist:   Tonya Furbish, MD     History of Present Illness:  Tonya Turner is a 77 y.o. female here for follow-up of PSVT. She was wearing a monitor and had heart rate of 206 bpm. Dizzy. Symptomatic. Referred her to Tonya Turner who she will be seeing soon for possible ablation. She has carried a diagnosis of paroxysmal atrial fibrillation however her current monitor seems to only demonstrate paroxysmal supraventricular tachycardia. Please see below for discussion but I inherited her as a patient a few years back after she had already been "diagnosed with atrial fibrillation ". I reviewed previous EKGs and do not see any overt evidence of atrial fibrillation. Xarelto.CHADS-VAS score and it is 2 -female, age greater than 71 She has diltiazem to 240 mg once a day She takes diltiazem as needed.  While she was sitting here she said "oh no I am feeling that sensation, aura, and sure enough I auscultated her and she was having SVT once again at approximately 200 bpm. By the time she laid down to get her EKG this had already broken.  09/25/16- I reviewed Tonya Turner note recomment on her very symptomatic recurrent mid RP SVT documented on monitor. She had failed medical therapy with diltiazem. Medicine and ablation were discussed at length. She preferred medical therapy and flecainide 50 mg twice a day was begun. She returned for 4-5 days for EKG, if she has additional SVT increased flecainide to 100 mg twice a day. He was aware of our visit today and stated that if she was tolerating her flecainide and that arrhythmias were controlled that he would advise an exercise treadmill test arranged at that time.  She reminds me that she was able to bring a copy of an EKG and presented to Tonya Turner for review and it was interpreted as atrial fibrillation. 2012.  02/06/17-today she  is here for follow-up of SVT. She is doing very well on low-dose flecainide. She desires to, the Xarelto. We had lengthy discussion about this. She wants to discuss this further with Tonya Turner as well.  02/07/2018- since we last chatted, she had implantable loop recorder placed in March 2019.  She has had no evidence of atrial fibrillation.  She would like to be notified of monthly transmissions however.  No palpitations.  No bleeding events.  She is off of anticoagulation at her own volition.  Understands potential risk.  She is happy with how she is feeling.  Going on a trip to [place] soon.  Denies any fevers chills nausea vomiting syncope bleeding.   Past Medical History:  Diagnosis Date  . Arthritis    "hands-mild; left hip" (09/28/2015)  . Basal cell carcinoma of lower leg    "cut out on both shins"  . DDD (degenerative disc disease), lumbar   . Degenerative disc disease, thoracic   . Eczema    history of  . Edema of extremities    usually lower extremities- bilateral  . Family history of adverse reaction to anesthesia    "brother is allergic and gets sick"  . Migraine    rare occular migraine- none recent (09/28/2015)  . PAF (paroxysmal atrial fibrillation) ([place]) 01/30/2011   ekg from Dr Tonya Turner office is reviewed and confirms afib  . Pneumonia 1940s  . Primary osteoarthritis of left hip   .  Unsteady gait   . Varicose vein    hx. of-     Past Surgical History:  Procedure Laterality Date  . BASAL CELL CARCINOMA EXCISION Bilateral    "shins"  . COLONOSCOPY WITH PROPOFOL N/A 10/28/2012   Procedure: COLONOSCOPY WITH PROPOFOL;  Surgeon: Tonya Fair, MD;  Location: [facility];  Service: Endoscopy;  Laterality: N/A;  . DILATION AND CURETTAGE OF UTERUS    . JOINT REPLACEMENT    . LOOP RECORDER INSERTION N/A 08/23/2017   Procedure: LOOP RECORDER INSERTION;  Surgeon: Tonya Grayer, MD;  Location: [facility];  Service: Cardiovascular;  Laterality: N/A;  . TOTAL HIP  ARTHROPLASTY Left 09/27/2015  . TOTAL HIP ARTHROPLASTY Left 09/27/2015   Procedure: TOTAL HIP ARTHROPLASTY ANTERIOR APPROACH;  Surgeon: Tonya Nakayama, MD;  Location: [facility];  Service: Orthopedics;  Laterality: Left;  . TUBAL LIGATION  1970s  . VARICOSE VEIN SURGERY Left 1970s    Current Medications: Outpatient Medications Prior to Visit  Medication Sig Dispense Refill  . Biotin w/ Vitamins C & E (HAIR/SKIN/NAILS PO) Take 1 tablet by mouth 2 (two) times daily.    . Calcium-Magnesium-Zinc (CAL-MAG-ZINC PO) Take 1 tablet by mouth daily with lunch.    . conjugated estrogens (PREMARIN) vaginal cream Place 1 Applicatorful vaginally 2 (two) times a week. Saturday & Tuesday.    . diltiazem (CARDIZEM) 30 MG tablet Take 1 tablet (30 mg total) by mouth every 6 (six) hours as needed (for rapid heart beat). 60 tablet 10  . Glucosamine-Chondroitin (COSAMIN DS PO) Take 0.5 tablets by mouth daily at 3 pm.    . Multiple Vitamin (MULTIVITAMIN WITH MINERALS) TABS tablet Take 0.5 tablets by mouth daily at 3 pm.     . triamcinolone cream (KENALOG) 0.1 % Apply 1 application topically daily as needed (for skin irritation/rash).     Marland Kitchen diltiazem (CARDIZEM CD) 240 MG 24 hr capsule Take 1 capsule (240 mg total) by mouth every morning. 90 capsule 0  . flecainide (TAMBOCOR) 50 MG tablet Take 1 tablet (50 mg total) by mouth 2 (two) times daily. 180 tablet 0   No facility-administered medications prior to visit.      Allergies:   Other; Adhesive [tape]; and Iodine solution [povidone iodine]   Social History   Socioeconomic History  . Marital status: Married    Spouse name: Not on file  . Number of children: Not on file  . Years of education: Not on file  . Highest education level: Not on file  Occupational History  . Not on file  Social Needs  . Financial resource strain: Not on file  . Food insecurity:    Worry: Not on file    Inability: Not on file  . Transportation needs:    Medical: Not on file     Non-medical: Not on file  Tobacco Use  . Smoking status: Former Smoker    Packs/day: 0.25    Years: 20.00    Pack years: 5.00    Types: Cigarettes    Last attempt to quit: 10/01/1983    Years since quitting: 34.3  . Smokeless tobacco: Never Used  Substance and Sexual Activity  . Alcohol use: Yes    Alcohol/week: 14.0 standard drinks    Types: 14 Glasses of wine per week  . Drug use: No  . Sexual activity: Not Currently  Lifestyle  . Physical activity:    Days per week: Not on file    Minutes per session: Not on file  .  Stress: Not on file  Relationships  . Social connections:    Talks on phone: Not on file    Gets together: Not on file    Attends religious service: Not on file    Active member of club or organization: Not on file    Attends meetings of clubs or organizations: Not on file    Relationship status: Not on file  Other Topics Concern  . Not on file  Social History Narrative  . Not on file     Family History:  The patient's family history includes Breast cancer in her cousin; Diabetes type II in her brother; Hypertension in her brother.   ROS:   Please see the history of present illness.    ROS no syncope, no further PSVT symptoms. Unless stated above all other review of systems negative.   PHYSICAL EXAM:   VS:  BP 120/82   Pulse 76   Ht 5\' 5"  (1.651 m)   Wt 147 lb 1.9 oz (66.7 kg)   LMP  (LMP Unknown)   BMI 24.48 kg/m    GEN: Well nourished, well developed, in no acute distress  HEENT: normal  Neck: no JVD, carotid bruits, or masses Cardiac: RRR; no murmurs, rubs, or gallops,no edema  Respiratory:  clear to auscultation bilaterally, normal work of breathing GI: soft, nontender, nondistended, + BS MS: no deformity or atrophy  Skin: warm and dry, no rash Neuro:  Alert and Oriented x 3, Strength and sensation are intact Psych: euthymic mood, full affect   Wt Readings from Last 3 Encounters:  02/07/18 147 lb 1.9 oz (66.7 kg)  11/25/17 150 lb (68  kg)  08/23/17 152 lb (68.9 kg)      Studies/Labs Reviewed:   EKG:  EKG is ordered today.  09/25/16 - SR 1st AVB, LAD QRS 21ms. The ekg ordered today demonstrates 07/04/16-on auscultation she was going to 200 bpm, by the time EKG was set up she was back in normal sinus rhythm at 75 bpm with no other abnormalities. Some reviewed  Recent Labs: 03/27/2017: BUN 12; Creatinine, Ser 0.79; Hemoglobin 13.4; Platelets 337; Potassium 4.6; Sodium 140   Lipid Panel No results found for: CHOL, TRIG, HDL, CHOLHDL, VLDL, LDLCALC, LDLDIRECT  Additional studies/ records that were reviewed today include:  EKG, hospital records, lab work reviewed  Exercise treadmill test 10/16/16-excellent with flecainide.   ASSESSMENT:    1. SVT (supraventricular tachycardia) (HCC)   2. Paroxysmal atrial fibrillation ([place])   3. PSVT (paroxysmal supraventricular tachycardia) ([facility])      PLAN:  In order of problems listed above:  PSVT-207 bpm.Discussion with Tonya Turner reviewed. Flecainide 50 mg twice a day is doing a great job. Has no SVT. QRS narrow. Treadmill test reassuring. Doing well, has not had any further incidences.   Paroxysmal atrial fibrillation  - in 2012, EKG demonstrates atrial fibrillation, this was reviewed by Tonya Turner. Paper copy presented. Her atrial fibrillation was documented on EKG in 2012. -In review of Tonya Turner note from 11/25/2017, she had an implantable loop recorder in March 2019 and has had no atrial fibrillation since.  At that visit, she wished to stop anticoagulation and was aware of increased stroke risk.   -She is going to be seeing Tonya Turner back in clinic.    Medication Adjustments/Labs and Tests Ordered: Current medicines are reviewed at length with the patient today.  Concerns regarding medicines are outlined above.  Medication changes, Labs and Tests ordered today are listed in the  Patient Instructions below. Patient Instructions  Medication Instructions:  The current  medical regimen is effective;  continue present plan and medications.  Follow-Up: Follow up in 2 years with Dr. Marlou Porch.  You will receive a letter in the mail 2 months before you are due.  Please call us when you receive this letter to schedule your follow up appointment.  If you need a refill on your cardiac medications before your next appointment, please call your pharmacy.  Thank you for choosing [facility]!!        Signed, Tonya Furbish, MD  02/07/2018 10:44 AM    [facility] [place], [place], [place]  [address] Phone: [phone]; Fax: [phone]

## 2018-02-12 LAB — CUP PACEART REMOTE DEVICE CHECK
Date Time Interrogation Session: 20190808123748
Implantable Pulse Generator Implant Date: 20190329

## 2018-02-13 ENCOUNTER — Telehealth: Payer: Self-pay

## 2018-02-13 NOTE — Telephone Encounter (Signed)
Error

## 2018-02-13 NOTE — Telephone Encounter (Signed)
Per precert- denial of loop recorder has been resolved.  Insurance has reimbursed for implant.  No further action needed.

## 2018-02-17 DIAGNOSIS — Z23 Encounter for immunization: Secondary | ICD-10-CM | POA: Diagnosis not present

## 2018-02-21 LAB — CUP PACEART REMOTE DEVICE CHECK
Date Time Interrogation Session: 20190910133948
MDC IDC PG IMPLANT DT: 20190329

## 2018-03-10 ENCOUNTER — Ambulatory Visit (INDEPENDENT_AMBULATORY_CARE_PROVIDER_SITE_OTHER): Payer: Medicare HMO | Admitting: *Deleted

## 2018-03-10 DIAGNOSIS — I480 Paroxysmal atrial fibrillation: Secondary | ICD-10-CM

## 2018-03-11 ENCOUNTER — Telehealth: Payer: Self-pay | Admitting: Cardiology

## 2018-03-11 NOTE — Telephone Encounter (Signed)
LMOVM requesting that pt send manual transmission b/c home monitor has not updated in at least 14 days.    

## 2018-03-11 NOTE — Progress Notes (Signed)
Carelink Summary Report / Loop Recorder 

## 2018-03-12 ENCOUNTER — Encounter: Payer: Self-pay | Admitting: Internal Medicine

## 2018-03-24 LAB — CUP PACEART REMOTE DEVICE CHECK
Date Time Interrogation Session: 20191013141000
MDC IDC PG IMPLANT DT: 20190329

## 2018-04-11 ENCOUNTER — Ambulatory Visit (INDEPENDENT_AMBULATORY_CARE_PROVIDER_SITE_OTHER): Payer: Medicare HMO | Admitting: *Deleted

## 2018-04-11 DIAGNOSIS — I480 Paroxysmal atrial fibrillation: Secondary | ICD-10-CM

## 2018-04-11 DIAGNOSIS — R55 Syncope and collapse: Secondary | ICD-10-CM

## 2018-04-11 NOTE — Progress Notes (Signed)
Carelink Summary Report / Loop Recorder 

## 2018-04-30 DIAGNOSIS — H2513 Age-related nuclear cataract, bilateral: Secondary | ICD-10-CM | POA: Diagnosis not present

## 2018-05-02 ENCOUNTER — Other Ambulatory Visit: Payer: Self-pay | Admitting: Obstetrics and Gynecology

## 2018-05-02 DIAGNOSIS — Z1231 Encounter for screening mammogram for malignant neoplasm of breast: Secondary | ICD-10-CM

## 2018-05-14 ENCOUNTER — Ambulatory Visit (INDEPENDENT_AMBULATORY_CARE_PROVIDER_SITE_OTHER): Payer: Medicare HMO

## 2018-05-14 DIAGNOSIS — I480 Paroxysmal atrial fibrillation: Secondary | ICD-10-CM | POA: Diagnosis not present

## 2018-05-14 LAB — CUP PACEART REMOTE DEVICE CHECK
MDC IDC PG IMPLANT DT: 20190329
MDC IDC SESS DTM: 20191218150835

## 2018-05-15 NOTE — Progress Notes (Signed)
Carelink Summary Report / Loop Recorder 

## 2018-06-01 LAB — CUP PACEART REMOTE DEVICE CHECK
Date Time Interrogation Session: 20191115143934
MDC IDC PG IMPLANT DT: 20190329

## 2018-06-09 ENCOUNTER — Encounter: Payer: Medicare HMO | Admitting: Internal Medicine

## 2018-06-09 ENCOUNTER — Telehealth: Payer: Self-pay

## 2018-06-09 NOTE — Telephone Encounter (Signed)
Manual transmission received and reviewed. 2 "tachy" episodes are false--ECGs show ST w/TWOS.

## 2018-06-09 NOTE — Telephone Encounter (Signed)
I spoke with pt and helped her send a manual transmission. I told the patient if the nurse see anything she will call her back.

## 2018-06-13 ENCOUNTER — Encounter: Payer: Self-pay | Admitting: Internal Medicine

## 2018-06-13 ENCOUNTER — Encounter (INDEPENDENT_AMBULATORY_CARE_PROVIDER_SITE_OTHER): Payer: Self-pay

## 2018-06-13 ENCOUNTER — Ambulatory Visit: Payer: Medicare HMO | Admitting: Internal Medicine

## 2018-06-13 VITALS — BP 110/72 | HR 70 | Ht 65.0 in | Wt 147.2 lb

## 2018-06-13 DIAGNOSIS — I480 Paroxysmal atrial fibrillation: Secondary | ICD-10-CM | POA: Diagnosis not present

## 2018-06-13 DIAGNOSIS — I471 Supraventricular tachycardia: Secondary | ICD-10-CM

## 2018-06-13 LAB — CUP PACEART INCLINIC DEVICE CHECK
Date Time Interrogation Session: 20200117161934
MDC IDC PG IMPLANT DT: 20190329

## 2018-06-13 MED ORDER — DILTIAZEM HCL ER COATED BEADS 180 MG PO CP24: 180.0000 mg | ORAL_CAPSULE | Freq: Every day | ORAL | 3 refills | Status: DC

## 2018-06-13 NOTE — Progress Notes (Signed)
PCP: Lavone Orn, MD Primary Cardiologist: Dr Marlou Porch Primary EP: Dr Rayann Heman  Tonya Turner is a 78 y.o. female who presents today for routine electrophysiology followup.  Since last being seen in our clinic, the patient reports doing very well.  Today, she denies symptoms of palpitations, chest pain, shortness of breath,  lower extremity edema, dizziness, presyncope, or syncope.  The patient is otherwise without complaint today.   Past Medical History:  Diagnosis Date  . Arthritis    "hands-mild; left hip" (09/28/2015)  . Basal cell carcinoma of lower leg    "cut out on both shins"  . DDD (degenerative disc disease), lumbar   . Degenerative disc disease, thoracic   . Eczema    history of  . Edema of extremities    usually lower extremities- bilateral  . Family history of adverse reaction to anesthesia    "brother is allergic and gets sick"  . Migraine    rare occular migraine- none recent (09/28/2015)  . PAF (paroxysmal atrial fibrillation) ([place]) 01/30/2011   ekg from Dr Delene Ruffini office is reviewed and confirms afib  . Pneumonia 1940s  . Primary osteoarthritis of left hip   . Unsteady gait   . Varicose vein    hx. of-    Past Surgical History:  Procedure Laterality Date  . BASAL CELL CARCINOMA EXCISION Bilateral    "shins"  . COLONOSCOPY WITH PROPOFOL N/A 10/28/2012   Procedure: COLONOSCOPY WITH PROPOFOL;  Surgeon: Garlan Fair, MD;  Location: [facility];  Service: Endoscopy;  Laterality: N/A;  . DILATION AND CURETTAGE OF UTERUS    . JOINT REPLACEMENT    . LOOP RECORDER INSERTION N/A 08/23/2017   Procedure: LOOP RECORDER INSERTION;  Surgeon: Thompson Grayer, MD;  Location: [place] [facility];  Service: Cardiovascular;  Laterality: N/A;  . TOTAL HIP ARTHROPLASTY Left 09/27/2015  . TOTAL HIP ARTHROPLASTY Left 09/27/2015   Procedure: TOTAL HIP ARTHROPLASTY ANTERIOR APPROACH;  Surgeon: Melrose Nakayama, MD;  Location: [facility];  Service: Orthopedics;  Laterality: Left;  . TUBAL  LIGATION  1970s  . VARICOSE VEIN SURGERY Left 1970s    ROS- all systems are reviewed and negatives except as per HPI above  Current Outpatient Medications  Medication Sig Dispense Refill  . Biotin w/ Vitamins C & E (HAIR/SKIN/NAILS PO) Take 1 tablet by mouth 2 (two) times daily.    . Calcium-Magnesium-Zinc (CAL-MAG-ZINC PO) Take 1 tablet by mouth daily with lunch.    . conjugated estrogens (PREMARIN) vaginal cream Place 1 Applicatorful vaginally 2 (two) times a week. Saturday & Tuesday.    . diltiazem (CARDIZEM CD) 240 MG 24 hr capsule Take 1 capsule (240 mg total) by mouth every morning. 30 capsule 0  . diltiazem (CARDIZEM) 30 MG tablet Take 1 tablet (30 mg total) by mouth every 6 (six) hours as needed (for rapid heart beat). 60 tablet 10  . flecainide (TAMBOCOR) 50 MG tablet Take 1 tablet (50 mg total) by mouth 2 (two) times daily. 60 tablet 0  . Glucosamine-Chondroitin (COSAMIN DS PO) Take 0.5 tablets by mouth daily at 3 pm.    . Multiple Vitamin (MULTIVITAMIN WITH MINERALS) TABS tablet Take 0.5 tablets by mouth daily at 3 pm.     . triamcinolone cream (KENALOG) 0.1 % Apply 1 application topically daily as needed (for skin irritation/rash).      No current facility-administered medications for this visit.     Physical Exam: Vitals:   06/13/18 1527  BP: 110/72  Pulse: 70  SpO2: 98%  Weight: 147 lb 3.2 oz (66.8 kg)  Height: 5\' 5"  (1.651 m)    GEN- The patient is well appearing, alert and oriented x 3 today.   Head- normocephalic, atraumatic Eyes-  Sclera clear, conjunctiva pink Ears- hearing intact Oropharynx- clear Lungs- Clear to ausculation bilaterally, normal work of breathing Heart- Regular rate and rhythm, no murmurs, rubs or gallops, PMI not laterally displaced GI- soft, NT, ND, + BS Extremities- no clubbing, cyanosis, or edema  Wt Readings from Last 3 Encounters:  06/13/18 147 lb 3.2 oz (66.8 kg)  02/07/18 147 lb 1.9 oz (66.7 kg)  11/25/17 150 lb (68 kg)    EKG  tracing ordered today is personally reviewed and shows sinus rhythm 70 bpm, PR 194 msec, QRS 90 msec  Assessment and Plan:  1. Paroxysmal atrial fibrillation Doing very well ILR interrogation reveals afib burden of 0% She did have TWOS for which we will turn "blank after sensing" on to extend blanking period and minimize this. No arrhythmias chads2vasc score is 3.  She has stopped anticoagulation but will consider restarting if further afib occurs. Reduce diltiazem CD to 180mg  daily today  2. SVT Stable No change required today  [facility] Follow-up with Dr Marlou Porch as scheduled Return to see me in 6 months  Thompson Grayer MD, [facility] 06/13/2018 3:32 PM

## 2018-06-13 NOTE — Patient Instructions (Addendum)
Medication Instructions:  Your physician has recommended you make the following change in your medication:   1.  Reduce your diltiazem--You will take diltiazem CD 180 mg one capsule by mouth daily  Labwork: None ordered.  Testing/Procedures: None ordered.  Follow-Up: Your physician wants you to follow-up in: 6 months with Dr. Rayann Heman.   You will receive a reminder letter in the mail two months in advance. If you don't receive a letter, please call our office to schedule the follow-up appointment.  Monthly remote checks   Any Other Special Instructions Will Be Listed Below (If Applicable).  If you need a refill on your cardiac medications before your next appointment, please call your pharmacy.

## 2018-06-16 ENCOUNTER — Ambulatory Visit (INDEPENDENT_AMBULATORY_CARE_PROVIDER_SITE_OTHER): Payer: Medicare HMO

## 2018-06-16 DIAGNOSIS — I480 Paroxysmal atrial fibrillation: Secondary | ICD-10-CM

## 2018-06-17 LAB — CUP PACEART REMOTE DEVICE CHECK
Date Time Interrogation Session: 20200120154042
MDC IDC PG IMPLANT DT: 20190329

## 2018-06-17 NOTE — Progress Notes (Signed)
Carelink Summary Report / Loop Recorder 

## 2018-06-19 ENCOUNTER — Telehealth: Payer: Self-pay | Admitting: Cardiology

## 2018-06-19 NOTE — Telephone Encounter (Signed)
LMOVM for pt requesting a manual transmission.

## 2018-06-20 NOTE — Telephone Encounter (Signed)
Reviewed 8 "AF" episode ECGs with Dr. Rayann Heman. All ECGs show noise artifact, no changes, continue to monitor.

## 2018-06-23 ENCOUNTER — Other Ambulatory Visit: Payer: Self-pay | Admitting: Internal Medicine

## 2018-06-23 DIAGNOSIS — N83201 Unspecified ovarian cyst, right side: Secondary | ICD-10-CM | POA: Diagnosis not present

## 2018-06-23 DIAGNOSIS — N83202 Unspecified ovarian cyst, left side: Secondary | ICD-10-CM | POA: Diagnosis not present

## 2018-06-25 ENCOUNTER — Telehealth: Payer: Self-pay

## 2018-06-25 ENCOUNTER — Ambulatory Visit
Admission: RE | Admit: 2018-06-25 | Discharge: 2018-06-25 | Disposition: A | Discharge status: Home / Self Care | Payer: Medicare HMO | Source: Ambulatory Visit | Attending: Obstetrics and Gynecology | Admitting: Obstetrics and Gynecology

## 2018-06-25 DIAGNOSIS — Z1231 Encounter for screening mammogram for malignant neoplasm of breast: Secondary | ICD-10-CM | POA: Diagnosis not present

## 2018-06-25 NOTE — Telephone Encounter (Signed)
Left a message for the patient to send a manual home remote transmission.

## 2018-06-26 NOTE — Telephone Encounter (Signed)
Manual transmission received and reviewed. "AF" episodes appear SR with noise. ECGs printed and placed in Dr. Otilio Connors folder for review.

## 2018-06-27 ENCOUNTER — Telehealth: Payer: Self-pay

## 2018-06-27 NOTE — Telephone Encounter (Signed)
Transmission reviewed. All AF episodes false  Chanetta Marshall, NP 06/27/2018 2:27 PM

## 2018-06-27 NOTE — Telephone Encounter (Signed)
I left a message for the pt to send a manual transmission with her home monitor.  I gave her the number to the office to give [place] a call back if she has any questions.

## 2018-07-04 ENCOUNTER — Telehealth: Payer: Self-pay | Admitting: Cardiology

## 2018-07-04 NOTE — Telephone Encounter (Signed)
Reviewed ECGs with Chanetta Marshall, NP. No clear AF, though noise makes interpretation difficult. ECGs placed in Dr. Otilio Connors folder for review. Will continue monitoring remotely via [facility].

## 2018-07-04 NOTE — Telephone Encounter (Signed)
Manual transmission received and reviewed. 6 "AF" episodes show noise artifact, difficult to interpret rhythm. Will review with provider.

## 2018-07-04 NOTE — Telephone Encounter (Signed)
Spoke w/ pt and requested that she send a manual transmission today. I also requested that she send a manual transmission weekly. Pt stated that she would do that.

## 2018-07-10 ENCOUNTER — Telehealth: Payer: Self-pay | Admitting: Cardiology

## 2018-07-10 NOTE — Telephone Encounter (Addendum)
Spoke with patient regarding episodes showing on LINQ.  She states on Tuesdays and Thursday she goes to deep water aerobic from 10:00-11:00 and water yoga from 11:00-11:45.  These times on LINQ corespond with these activities.  She does state that a couple of times toward the end of water yoga that she has felt chilled and anxious.  She says 3 times that she can remember.  She reports compliance with cardiac meds.Told patient I would review information with Dr Rayann Heman to see if any further recommendations are needed.  If recommendations are made will call patient back.

## 2018-07-10 NOTE — Telephone Encounter (Signed)
Attempted to reach patient.  Spoke with Kenton Vale, husband.  He states patient will be back around 3:30.  Asked him to have her call office.

## 2018-07-11 NOTE — Telephone Encounter (Signed)
Routed to Dr. Rayann Heman as Juluis Rainier. Noise episodes on LINQ seem to correlate with water fitness classes. Will continue to request weekly manual transmissions from patient for review of episodes.

## 2018-07-14 ENCOUNTER — Other Ambulatory Visit: Payer: Self-pay | Admitting: Internal Medicine

## 2018-07-18 ENCOUNTER — Telehealth: Payer: Self-pay | Admitting: Internal Medicine

## 2018-07-18 NOTE — Telephone Encounter (Signed)
Noise artifact episodes noted on LINQ seem to correlate with patient's water fitness classes on Tuesdays and Thursdays.

## 2018-07-18 NOTE — Telephone Encounter (Signed)
  Patient states the device clinic called her asking about dates and times and the patient would like to know why they needed that information. She would like to know if something was wrong.

## 2018-07-18 NOTE — Telephone Encounter (Signed)
Returned call to patient.  Reinforced that episodes that we spoke about last week were mostly noise artifact on the the loop and that they coordinated with her water aerobics and yoga.

## 2018-07-21 ENCOUNTER — Ambulatory Visit (INDEPENDENT_AMBULATORY_CARE_PROVIDER_SITE_OTHER): Payer: Medicare HMO | Admitting: *Deleted

## 2018-07-21 DIAGNOSIS — I480 Paroxysmal atrial fibrillation: Secondary | ICD-10-CM

## 2018-07-22 LAB — CUP PACEART REMOTE DEVICE CHECK
Date Time Interrogation Session: 20200222164040
MDC IDC PG IMPLANT DT: 20190329

## 2018-07-28 DIAGNOSIS — D229 Melanocytic nevi, unspecified: Secondary | ICD-10-CM | POA: Diagnosis not present

## 2018-07-28 DIAGNOSIS — L111 Transient acantholytic dermatosis [Grover]: Secondary | ICD-10-CM | POA: Diagnosis not present

## 2018-07-28 DIAGNOSIS — L570 Actinic keratosis: Secondary | ICD-10-CM | POA: Diagnosis not present

## 2018-07-28 DIAGNOSIS — L918 Other hypertrophic disorders of the skin: Secondary | ICD-10-CM | POA: Diagnosis not present

## 2018-07-29 NOTE — Progress Notes (Signed)
Carelink Summary Report / Loop Recorder 

## 2018-08-21 ENCOUNTER — Ambulatory Visit (INDEPENDENT_AMBULATORY_CARE_PROVIDER_SITE_OTHER): Payer: Medicare HMO | Admitting: *Deleted

## 2018-08-21 ENCOUNTER — Other Ambulatory Visit: Payer: Self-pay

## 2018-08-21 DIAGNOSIS — I480 Paroxysmal atrial fibrillation: Secondary | ICD-10-CM

## 2018-08-23 LAB — CUP PACEART REMOTE DEVICE CHECK
Date Time Interrogation Session: 20200326174121
MDC IDC PG IMPLANT DT: 20190329

## 2018-08-28 NOTE — Progress Notes (Signed)
Carelink Summary Report / Loop Recorder 

## 2018-09-23 ENCOUNTER — Ambulatory Visit (INDEPENDENT_AMBULATORY_CARE_PROVIDER_SITE_OTHER): Payer: Medicare HMO | Admitting: *Deleted

## 2018-09-23 ENCOUNTER — Other Ambulatory Visit: Payer: Self-pay

## 2018-09-23 DIAGNOSIS — I480 Paroxysmal atrial fibrillation: Secondary | ICD-10-CM

## 2018-09-23 DIAGNOSIS — R55 Syncope and collapse: Secondary | ICD-10-CM

## 2018-09-24 LAB — CUP PACEART REMOTE DEVICE CHECK
Date Time Interrogation Session: 20200428181244
Implantable Pulse Generator Implant Date: 20190329

## 2018-10-02 NOTE — Progress Notes (Signed)
Carelink Summary Report / Loop Recorder 

## 2018-10-10 DIAGNOSIS — M79605 Pain in left leg: Secondary | ICD-10-CM | POA: Diagnosis not present

## 2018-10-15 DIAGNOSIS — M5442 Lumbago with sciatica, left side: Secondary | ICD-10-CM | POA: Diagnosis not present

## 2018-10-15 DIAGNOSIS — M1712 Unilateral primary osteoarthritis, left knee: Secondary | ICD-10-CM | POA: Diagnosis not present

## 2018-10-27 ENCOUNTER — Ambulatory Visit (INDEPENDENT_AMBULATORY_CARE_PROVIDER_SITE_OTHER): Payer: Medicare HMO | Admitting: *Deleted

## 2018-10-27 DIAGNOSIS — I480 Paroxysmal atrial fibrillation: Secondary | ICD-10-CM | POA: Diagnosis not present

## 2018-10-27 LAB — CUP PACEART REMOTE DEVICE CHECK
Date Time Interrogation Session: 20200531183957
Implantable Pulse Generator Implant Date: 20190329

## 2018-11-04 NOTE — Progress Notes (Signed)
Carelink Summary Report / Loop Recorder 

## 2018-11-12 DIAGNOSIS — M5442 Lumbago with sciatica, left side: Secondary | ICD-10-CM | POA: Diagnosis not present

## 2018-11-24 DIAGNOSIS — M2041 Other hammer toe(s) (acquired), right foot: Secondary | ICD-10-CM | POA: Diagnosis not present

## 2018-11-24 DIAGNOSIS — M79672 Pain in left foot: Secondary | ICD-10-CM | POA: Diagnosis not present

## 2018-11-24 DIAGNOSIS — M21611 Bunion of right foot: Secondary | ICD-10-CM | POA: Diagnosis not present

## 2018-11-24 DIAGNOSIS — M79671 Pain in right foot: Secondary | ICD-10-CM | POA: Diagnosis not present

## 2018-11-24 DIAGNOSIS — M7741 Metatarsalgia, right foot: Secondary | ICD-10-CM | POA: Diagnosis not present

## 2018-11-26 ENCOUNTER — Telehealth: Payer: Self-pay

## 2018-11-26 NOTE — Telephone Encounter (Addendum)
LMOVM for pt to call DC back. Calling to schedule DC appt.

## 2018-11-27 NOTE — Telephone Encounter (Signed)
Pt scheduled for DC appt 12/11/18.

## 2018-12-01 ENCOUNTER — Ambulatory Visit (INDEPENDENT_AMBULATORY_CARE_PROVIDER_SITE_OTHER): Payer: Medicare HMO | Admitting: *Deleted

## 2018-12-01 DIAGNOSIS — I480 Paroxysmal atrial fibrillation: Secondary | ICD-10-CM | POA: Diagnosis not present

## 2018-12-02 LAB — CUP PACEART REMOTE DEVICE CHECK
Date Time Interrogation Session: 20200703194029
Implantable Pulse Generator Implant Date: 20190329

## 2018-12-04 ENCOUNTER — Telehealth: Payer: Self-pay | Admitting: Internal Medicine

## 2018-12-04 NOTE — Telephone Encounter (Signed)
New message   The patient would like for Dr. Jackalyn Lombard office to know that her appointment was changed from July 20th to September 14th due to virus concerns. Please contact the patient to discuss.

## 2018-12-04 NOTE — Telephone Encounter (Signed)
Left message with significant other. Pt will call back when she is available.    Legrand Como [address]" [place], PA-C 12/04/2018 1:16 PM

## 2018-12-04 NOTE — Telephone Encounter (Signed)
Spoke with patient. She was calling to check that it is safe for her to wait until 02/09/19 to see Dr. Rayann Heman. She is currently feeling well, not aware of any cardiac issues. Reassured pt that if any issues arise before that appointment, we can reassess f/u plan at that time. Pt verbalizes understanding and will keep 12/11/18 DC appointment for [facility] reprogramming. No further questions at this time.

## 2018-12-08 DIAGNOSIS — Z1389 Encounter for screening for other disorder: Secondary | ICD-10-CM | POA: Diagnosis not present

## 2018-12-08 DIAGNOSIS — Z0000 Encounter for general adult medical examination without abnormal findings: Secondary | ICD-10-CM | POA: Diagnosis not present

## 2018-12-11 ENCOUNTER — Ambulatory Visit (INDEPENDENT_AMBULATORY_CARE_PROVIDER_SITE_OTHER): Payer: Medicare HMO | Admitting: *Deleted

## 2018-12-11 ENCOUNTER — Other Ambulatory Visit: Payer: Self-pay

## 2018-12-11 ENCOUNTER — Telehealth: Payer: Self-pay

## 2018-12-11 DIAGNOSIS — I480 Paroxysmal atrial fibrillation: Secondary | ICD-10-CM

## 2018-12-11 NOTE — Telephone Encounter (Signed)
    COVID-19 Pre-Screening Questions:  . In the past 7 to 10 days have you had a cough,  shortness of breath, headache, congestion, fever (100 or greater) body aches, chills, sore throat, or sudden loss of taste or sense of smell? No . Have you been around anyone with known Covid 19.No . Have you been around anyone who is awaiting Covid 19 test results in the past 7 to 10 days? No . Have you been around anyone who has been exposed to Covid 19, or has mentioned symptoms of Covid 19 within the past 7 to 10 days? No  If you have any concerns/questions about symptoms patients report during screening (either on the phone or at threshold). Contact the provider seeing the patient or DOD for further guidance.  If neither are available contact a member of the leadership team.        Pt answered No to all Covid-19 prescreening questions. I asked the pt to wear a mask to the appointment. I also explained to her that we are reducing the number of people who is coming into the office and to come alone if she can. The pt verbalized understanding. She was very nice to me on the phone.

## 2018-12-12 LAB — CUP PACEART INCLINIC DEVICE CHECK
Date Time Interrogation Session: 20200716180452
Implantable Pulse Generator Implant Date: 20190329

## 2018-12-12 NOTE — Progress Notes (Signed)
Carelink Summary Report / Loop Recorder 

## 2018-12-12 NOTE — Progress Notes (Signed)
Loop check in clinic. Battery status: ok. R-waves .27 mV. 0 symptom episodes, 0 tachy episodes, 0 pause episodes, 0 brady episodes. 69 AF episodes , that appeared to be false due to T-wave over sensing. Per Dr Rayann Heman and industry recommendations sensitivity programmed to 0.018mV. AF detection programmed to less sensitive, decay delay programmed to 266ms and AT/AF recording threshold programmed to record episodes> 10 minutes. Next home remote 12/31/18. F/U with Dr Rayann Heman 02/09/19.

## 2018-12-15 ENCOUNTER — Encounter: Payer: Medicare HMO | Admitting: Internal Medicine

## 2018-12-23 DIAGNOSIS — S39012D Strain of muscle, fascia and tendon of lower back, subsequent encounter: Secondary | ICD-10-CM | POA: Diagnosis not present

## 2018-12-23 DIAGNOSIS — M47816 Spondylosis without myelopathy or radiculopathy, lumbar region: Secondary | ICD-10-CM | POA: Diagnosis not present

## 2018-12-31 ENCOUNTER — Ambulatory Visit (INDEPENDENT_AMBULATORY_CARE_PROVIDER_SITE_OTHER): Payer: Medicare HMO | Admitting: *Deleted

## 2018-12-31 DIAGNOSIS — I480 Paroxysmal atrial fibrillation: Secondary | ICD-10-CM

## 2019-01-01 LAB — CUP PACEART REMOTE DEVICE CHECK
Date Time Interrogation Session: 20200805193747
Implantable Pulse Generator Implant Date: 20190329

## 2019-01-06 NOTE — Progress Notes (Signed)
Carelink Summary Report / Loop Recorder 

## 2019-01-13 DIAGNOSIS — M1712 Unilateral primary osteoarthritis, left knee: Secondary | ICD-10-CM | POA: Diagnosis not present

## 2019-02-03 ENCOUNTER — Ambulatory Visit (INDEPENDENT_AMBULATORY_CARE_PROVIDER_SITE_OTHER): Payer: Medicare HMO | Admitting: *Deleted

## 2019-02-03 DIAGNOSIS — I480 Paroxysmal atrial fibrillation: Secondary | ICD-10-CM

## 2019-02-04 LAB — CUP PACEART REMOTE DEVICE CHECK
Date Time Interrogation Session: 20200907224111
Implantable Pulse Generator Implant Date: 20190329

## 2019-02-09 ENCOUNTER — Other Ambulatory Visit: Payer: Self-pay

## 2019-02-09 ENCOUNTER — Telehealth (INDEPENDENT_AMBULATORY_CARE_PROVIDER_SITE_OTHER): Payer: Medicare HMO | Admitting: Internal Medicine

## 2019-02-09 ENCOUNTER — Encounter: Payer: Self-pay | Admitting: Internal Medicine

## 2019-02-09 VITALS — BP 96/64 | HR 77 | Ht 65.0 in | Wt 140.0 lb

## 2019-02-09 DIAGNOSIS — I480 Paroxysmal atrial fibrillation: Secondary | ICD-10-CM

## 2019-02-09 DIAGNOSIS — I471 Supraventricular tachycardia: Secondary | ICD-10-CM

## 2019-02-09 MED ORDER — DILTIAZEM HCL ER COATED BEADS 120 MG PO CP24: 120.0000 mg | ORAL_CAPSULE | Freq: Every day | ORAL | 3 refills | Status: DC

## 2019-02-09 NOTE — Progress Notes (Signed)
Electrophysiology TeleHealth Note  Due to national recommendations of social distancing due to [place] 19, an audio telehealth visit is felt to be most appropriate for this patient at this time.  Verbal consent was obtained by me for the telehealth visit today.  The patient does not have capability for a virtual visit.  A phone visit is therefore required today.   Date:  02/09/2019   ID:  Tonya Turner, DOB 02-03-1941, MRN [id number]  Location: patient's home  Provider location:  [place]  Evaluation Performed: Follow-up visit  PCP:  Lavone Orn, MD   Electrophysiologist:  Dr Rayann Heman  Chief Complaint:  palpitations  History of Present Illness:    Tonya Turner is a 78 y.o. female who presents via telehealth conferencing today.  Since last being seen in our clinic, the patient reports doing very well.  No arrhythmias. Today, she denies symptoms of palpitations, chest pain, shortness of breath,  lower extremity edema, dizziness, presyncope, or syncope.  She has some fatigue. The patient is otherwise without complaint today.  The patient denies symptoms of fevers, chills, cough, or new SOB worrisome for COVID 19.  Past Medical History:  Diagnosis Date  . Arthritis    "hands-mild; left hip" (09/28/2015)  . Basal cell carcinoma of lower leg    "cut out on both shins"  . DDD (degenerative disc disease), lumbar   . Degenerative disc disease, thoracic   . Eczema    history of  . Edema of extremities    usually lower extremities- bilateral  . Family history of adverse reaction to anesthesia    "brother is allergic and gets sick"  . Migraine    rare occular migraine- none recent (09/28/2015)  . PAF (paroxysmal atrial fibrillation) ([place]) 01/30/2011   ekg from Dr Delene Ruffini office is reviewed and confirms afib  . Pneumonia 1940s  . Primary osteoarthritis of left hip   . Unsteady gait   . Varicose vein    hx. of-     Past Surgical History:  Procedure Laterality Date  . BASAL  CELL CARCINOMA EXCISION Bilateral    "shins"  . COLONOSCOPY WITH PROPOFOL N/A 10/28/2012   Procedure: COLONOSCOPY WITH PROPOFOL;  Surgeon: Garlan Fair, MD;  Location: [facility];  Service: Endoscopy;  Laterality: N/A;  . DILATION AND CURETTAGE OF UTERUS    . JOINT REPLACEMENT    . LOOP RECORDER INSERTION N/A 08/23/2017   Procedure: LOOP RECORDER INSERTION;  Surgeon: Thompson Grayer, MD;  Location: [facility];  Service: Cardiovascular;  Laterality: N/A;  . TOTAL HIP ARTHROPLASTY Left 09/27/2015  . TOTAL HIP ARTHROPLASTY Left 09/27/2015   Procedure: TOTAL HIP ARTHROPLASTY ANTERIOR APPROACH;  Surgeon: Melrose Nakayama, MD;  Location: [facility];  Service: Orthopedics;  Laterality: Left;  . TUBAL LIGATION  1970s  . VARICOSE VEIN SURGERY Left 1970s    Current Outpatient Medications  Medication Sig Dispense Refill  . Biotin w/ Vitamins C & E (HAIR/SKIN/NAILS PO) Take 1 tablet by mouth 2 (two) times daily.    . Calcium-Magnesium-Zinc (CAL-MAG-ZINC PO) Take 1 tablet by mouth daily with lunch.    . conjugated estrogens (PREMARIN) vaginal cream Place 1 Applicatorful vaginally 2 (two) times a week. Saturday & Tuesday.    . diltiazem (CARDIZEM) 30 MG tablet Take 1 tablet (30 mg total) by mouth every 6 (six) hours as needed (for rapid heart beat). 60 tablet 10  . flecainide (TAMBOCOR) 50 MG tablet Take 1 tablet (50 mg total) by  mouth 2 (two) times daily. 60 tablet 0  . Glucosamine-Chondroitin (COSAMIN DS PO) Take 0.5 tablets by mouth daily at 3 pm.    . Multiple Vitamin (MULTIVITAMIN WITH MINERALS) TABS tablet Take 0.5 tablets by mouth daily at 3 pm.     . triamcinolone cream (KENALOG) 0.1 % Apply 1 application topically daily as needed (for skin irritation/rash).     Marland Kitchen diltiazem (CARDIZEM CD) 180 MG 24 hr capsule Take 1 capsule (180 mg total) by mouth daily. 90 capsule 3   No current facility-administered medications for this visit.     Allergies:   Other, Adhesive [tape], and Iodine solution  [povidone iodine]   Social History:  The patient  reports that she quit smoking about 35 years ago. Her smoking use included cigarettes. She has a 5.00 pack-year smoking history. She has never used smokeless tobacco. She reports current alcohol use of about 14.0 standard drinks of alcohol per week. She reports that she does not use drugs.   Family History:  The patient's  family history includes Breast cancer in her cousin; Diabetes type II in her brother; Hypertension in her brother.   ROS:  Please see the history of present illness.   All other systems are personally reviewed and negative.    Exam:    Vital Signs:  BP 96/64   Pulse 77   Ht 5\' 5"  (1.651 m)   Wt 140 lb (63.5 kg)   LMP  (LMP Unknown)   BMI 23.30 kg/m   Well sounding , alert and conversant   Labs/Other Tests and Data Reviewed:    Recent Labs: No results found for requested labs within last 8760 hours.   Wt Readings from Last 3 Encounters:  02/09/19 140 lb (63.5 kg)  06/13/18 147 lb 3.2 oz (66.8 kg)  02/07/18 147 lb 1.9 oz (66.7 kg)     Last device remote is reviewed from [facility] PDF which reveals normal device function, no arrhythmias    ASSESSMENT & PLAN:    1.  Paroxymal artrial fibrillation Well controlled off AAD therapy--> only a single episode in 2012... (AF burden by LINQ is 0%) chads2vasc score is 3.  Consider restarting anticoagulation if further afib  2. SVT Stable Reduce diltiazem CD to 120mg  daily  Follow-up: 12 months with me   Patient Risk:  after full review of this patients clinical status, I feel that they are at moderate risk at this time.  Today, I have spent 15 minutes with the patient with telehealth technology discussing arrhythmia management .    SignedThompson Grayer, MD  02/09/2019 3:37 PM     [address] [place] [address] [phone] (office) [phone] (fax)

## 2019-02-18 DIAGNOSIS — S63297A Dislocation of distal interphalangeal joint of left little finger, initial encounter: Secondary | ICD-10-CM | POA: Diagnosis not present

## 2019-02-18 DIAGNOSIS — R52 Pain, unspecified: Secondary | ICD-10-CM | POA: Insufficient documentation

## 2019-02-18 DIAGNOSIS — S63289A Dislocation of proximal interphalangeal joint of unspecified finger, initial encounter: Secondary | ICD-10-CM | POA: Diagnosis not present

## 2019-02-19 NOTE — Progress Notes (Signed)
Carelink Summary Report / Loop Recorder 

## 2019-03-04 ENCOUNTER — Other Ambulatory Visit: Payer: Self-pay | Admitting: Cardiology

## 2019-03-08 LAB — CUP PACEART REMOTE DEVICE CHECK
Date Time Interrogation Session: 20201009162345
Implantable Pulse Generator Implant Date: 20190329

## 2019-03-09 ENCOUNTER — Ambulatory Visit (INDEPENDENT_AMBULATORY_CARE_PROVIDER_SITE_OTHER): Payer: Medicare HMO | Admitting: *Deleted

## 2019-03-09 DIAGNOSIS — R55 Syncope and collapse: Secondary | ICD-10-CM

## 2019-03-09 DIAGNOSIS — I480 Paroxysmal atrial fibrillation: Secondary | ICD-10-CM

## 2019-03-09 LAB — CUP PACEART REMOTE DEVICE CHECK
Date Time Interrogation Session: 20201012163020
Implantable Pulse Generator Implant Date: 20190329

## 2019-03-15 LAB — CUP PACEART REMOTE DEVICE CHECK
Date Time Interrogation Session: 20201017213037
Implantable Pulse Generator Implant Date: 20190329

## 2019-03-18 DIAGNOSIS — S63289A Dislocation of proximal interphalangeal joint of unspecified finger, initial encounter: Secondary | ICD-10-CM | POA: Diagnosis not present

## 2019-03-19 DIAGNOSIS — I471 Supraventricular tachycardia: Secondary | ICD-10-CM | POA: Diagnosis not present

## 2019-03-19 DIAGNOSIS — I480 Paroxysmal atrial fibrillation: Secondary | ICD-10-CM | POA: Diagnosis not present

## 2019-03-19 DIAGNOSIS — M19049 Primary osteoarthritis, unspecified hand: Secondary | ICD-10-CM | POA: Diagnosis not present

## 2019-03-19 NOTE — Progress Notes (Signed)
Carelink Summary Report / Loop Recorder 

## 2019-03-21 DIAGNOSIS — Z23 Encounter for immunization: Secondary | ICD-10-CM | POA: Diagnosis not present

## 2019-03-23 DIAGNOSIS — L723 Sebaceous cyst: Secondary | ICD-10-CM | POA: Diagnosis not present

## 2019-04-11 LAB — CUP PACEART REMOTE DEVICE CHECK
Date Time Interrogation Session: 20201114145844
Implantable Pulse Generator Implant Date: 20190329

## 2019-04-13 ENCOUNTER — Ambulatory Visit (INDEPENDENT_AMBULATORY_CARE_PROVIDER_SITE_OTHER): Payer: Medicare HMO | Admitting: *Deleted

## 2019-04-13 DIAGNOSIS — I480 Paroxysmal atrial fibrillation: Secondary | ICD-10-CM

## 2019-04-13 DIAGNOSIS — R55 Syncope and collapse: Secondary | ICD-10-CM | POA: Diagnosis not present

## 2019-05-01 DIAGNOSIS — S63289A Dislocation of proximal interphalangeal joint of unspecified finger, initial encounter: Secondary | ICD-10-CM | POA: Diagnosis not present

## 2019-05-09 NOTE — Progress Notes (Signed)
Carelink Summary Report / Loop Recorder 

## 2019-05-13 ENCOUNTER — Other Ambulatory Visit: Payer: Self-pay

## 2019-05-13 ENCOUNTER — Ambulatory Visit: Payer: Medicare HMO | Admitting: Podiatry

## 2019-05-13 ENCOUNTER — Encounter: Payer: Self-pay | Admitting: Podiatry

## 2019-05-13 DIAGNOSIS — M21619 Bunion of unspecified foot: Secondary | ICD-10-CM

## 2019-05-13 DIAGNOSIS — M2041 Other hammer toe(s) (acquired), right foot: Secondary | ICD-10-CM

## 2019-05-13 DIAGNOSIS — L84 Corns and callosities: Secondary | ICD-10-CM | POA: Diagnosis not present

## 2019-05-13 DIAGNOSIS — M779 Enthesopathy, unspecified: Secondary | ICD-10-CM

## 2019-05-13 DIAGNOSIS — M7751 Other enthesopathy of right foot: Secondary | ICD-10-CM | POA: Diagnosis not present

## 2019-05-13 NOTE — Progress Notes (Signed)
Subjective:   Patient ID: Tonya Turner, female   DOB: 78 y.o.   MRN: [id number]   HPI Patient presents with inflammation second digit right foot with fluid buildup around the distal lesion and also states she is getting new orthotics at the end of the month due to chronic pain around the second metatarsal right.  Also has several other lesions that are painful   ROS      Objective:  Physical Exam  Patient is found to have significant structural bunion and hammertoe deformity right over left with inflammatory keratotic lesion on the medial side second digit right that is painful and is making shoe gear difficult.  There is pressure between the big toe second toe and there is also a lesion subfifth metatarsal right with fluid buildup around the second toe     Assessment:  Significant structural bunion deformity and also hammertoe deformity along with inflammatory capsulitis inner phalangeal joint second digit right and chronic keratotic lesion second digit fifth metatarsal right     Plan:  H&P reviewed all conditions and today I did sterile prep and injected the inner phalangeal joint right second toe 2 mg dexamethasone 5 mg Xylocaine I debrided several lesions and applied dressing instructed on trying to keep the toes apart and patient is to see Liliane Channel at the end of the month to have new orthotics made.  I spent a great deal of time going over surgical intervention in this case and we are continuing to try to avoid that

## 2019-05-14 ENCOUNTER — Ambulatory Visit (INDEPENDENT_AMBULATORY_CARE_PROVIDER_SITE_OTHER): Payer: Medicare HMO | Admitting: *Deleted

## 2019-05-14 DIAGNOSIS — R55 Syncope and collapse: Secondary | ICD-10-CM | POA: Diagnosis not present

## 2019-05-14 LAB — CUP PACEART REMOTE DEVICE CHECK
Date Time Interrogation Session: 20201217123534
Implantable Pulse Generator Implant Date: 20190329

## 2019-05-20 NOTE — Telephone Encounter (Signed)
error 

## 2019-05-21 ENCOUNTER — Telehealth: Payer: Self-pay

## 2019-05-21 NOTE — Telephone Encounter (Signed)
LMOVM for pt to stop sending manual transmissions with his home monitor.

## 2019-05-25 ENCOUNTER — Ambulatory Visit: Payer: Medicare HMO | Admitting: Orthotics

## 2019-05-25 ENCOUNTER — Other Ambulatory Visit: Payer: Self-pay

## 2019-05-25 DIAGNOSIS — M779 Enthesopathy, unspecified: Secondary | ICD-10-CM

## 2019-05-25 DIAGNOSIS — L84 Corns and callosities: Secondary | ICD-10-CM

## 2019-05-25 NOTE — Progress Notes (Signed)
Due to financial considerations, patient decided to get f/o refurbished with additonal cushioning to offset fat pad atrophy.

## 2019-06-08 ENCOUNTER — Telehealth: Payer: Self-pay | Admitting: *Deleted

## 2019-06-08 NOTE — Telephone Encounter (Signed)
LMOM requesting call back to DC. Direct number and office hours provided.  Pt has been sending manual transmissions weekly per our clinic request. Weekly transmissions no longer necessary as LINQ has not detected any false episodes since 10/2018. Will make pt aware.

## 2019-06-10 NOTE — Telephone Encounter (Signed)
Patient notified that weekly transmissions are no longer needed as LINQ has not detected any false episodes since 10/2018.

## 2019-06-13 NOTE — Progress Notes (Signed)
ILR remote 

## 2019-06-16 ENCOUNTER — Ambulatory Visit (INDEPENDENT_AMBULATORY_CARE_PROVIDER_SITE_OTHER): Payer: Medicare HMO | Admitting: *Deleted

## 2019-06-16 DIAGNOSIS — R55 Syncope and collapse: Secondary | ICD-10-CM

## 2019-06-16 LAB — CUP PACEART REMOTE DEVICE CHECK
Date Time Interrogation Session: 20210119124602
Implantable Pulse Generator Implant Date: 20190329

## 2019-06-26 ENCOUNTER — Ambulatory Visit: Payer: Medicare HMO

## 2019-06-30 ENCOUNTER — Telehealth: Payer: Self-pay

## 2019-06-30 NOTE — Telephone Encounter (Signed)
Pt returned call regarding rescheduling her covid vaccine. She and her husband had their first moderna vaccination at [facility] in [place] because they will be moving their shortly.  Neither will need an appointment.  [place]

## 2019-07-04 ENCOUNTER — Ambulatory Visit: Payer: Medicare HMO | Attending: Internal Medicine

## 2019-07-08 ENCOUNTER — Telehealth: Payer: Self-pay | Admitting: Podiatry

## 2019-07-08 NOTE — Telephone Encounter (Signed)
Pt called and picked up refurbished orthotics. She wore them and they seemed fine but she then took the old pair that were not refurbished and compared them and said the older pair has more support that the refurbished pair.  I explained that when we refurbish they do nothing with the hard base that they just recover it. She still thinks the refurbished ones are different so I have scheduled her to see Liliane Channel when he is back in the office at the end of march.

## 2019-07-20 ENCOUNTER — Ambulatory Visit (INDEPENDENT_AMBULATORY_CARE_PROVIDER_SITE_OTHER): Payer: Medicare HMO | Admitting: *Deleted

## 2019-07-20 DIAGNOSIS — R55 Syncope and collapse: Secondary | ICD-10-CM | POA: Diagnosis not present

## 2019-07-20 LAB — CUP PACEART REMOTE DEVICE CHECK
Date Time Interrogation Session: 20210222002557
Implantable Pulse Generator Implant Date: 20190329

## 2019-07-21 DIAGNOSIS — M19049 Primary osteoarthritis, unspecified hand: Secondary | ICD-10-CM | POA: Diagnosis not present

## 2019-07-21 DIAGNOSIS — I480 Paroxysmal atrial fibrillation: Secondary | ICD-10-CM | POA: Diagnosis not present

## 2019-07-21 NOTE — Progress Notes (Signed)
ILR Remote 

## 2019-07-28 NOTE — Telephone Encounter (Signed)
When we refurbish we just replace the top cover and any cushioning.   We do nothing raising/lowering the shell.

## 2019-08-19 ENCOUNTER — Other Ambulatory Visit: Payer: Self-pay

## 2019-08-19 ENCOUNTER — Ambulatory Visit: Payer: Medicare HMO | Admitting: Orthotics

## 2019-08-19 DIAGNOSIS — M779 Enthesopathy, unspecified: Secondary | ICD-10-CM

## 2019-08-19 DIAGNOSIS — M2041 Other hammer toe(s) (acquired), right foot: Secondary | ICD-10-CM

## 2019-08-19 DIAGNOSIS — L84 Corns and callosities: Secondary | ICD-10-CM

## 2019-08-19 NOTE — Progress Notes (Signed)
Sending f/o back to add met bar instead of pad.

## 2019-08-20 ENCOUNTER — Encounter: Payer: Self-pay | Admitting: Podiatry

## 2019-08-20 ENCOUNTER — Other Ambulatory Visit: Payer: Medicare HMO | Admitting: Orthotics

## 2019-08-20 ENCOUNTER — Ambulatory Visit (INDEPENDENT_AMBULATORY_CARE_PROVIDER_SITE_OTHER): Payer: Medicare HMO | Admitting: *Deleted

## 2019-08-20 ENCOUNTER — Ambulatory Visit: Payer: Medicare HMO | Admitting: Podiatry

## 2019-08-20 DIAGNOSIS — M2041 Other hammer toe(s) (acquired), right foot: Secondary | ICD-10-CM | POA: Diagnosis not present

## 2019-08-20 DIAGNOSIS — M79673 Pain in unspecified foot: Secondary | ICD-10-CM

## 2019-08-20 DIAGNOSIS — R55 Syncope and collapse: Secondary | ICD-10-CM

## 2019-08-20 DIAGNOSIS — M21619 Bunion of unspecified foot: Secondary | ICD-10-CM

## 2019-08-20 DIAGNOSIS — G8929 Other chronic pain: Secondary | ICD-10-CM | POA: Diagnosis not present

## 2019-08-20 LAB — CUP PACEART REMOTE DEVICE CHECK
Date Time Interrogation Session: 20210325012655
Implantable Pulse Generator Implant Date: 20190329

## 2019-08-21 NOTE — Progress Notes (Signed)
ILR Remote 

## 2019-08-24 ENCOUNTER — Telehealth: Payer: Self-pay | Admitting: *Deleted

## 2019-08-24 NOTE — Telephone Encounter (Signed)
Pt states she received a large gel strap that is very helpful and she would like to purchase 2 more.

## 2019-08-24 NOTE — Telephone Encounter (Signed)
I informed pt that she could purchase the gel straps in the front reception.

## 2019-08-24 NOTE — Telephone Encounter (Addendum)
Dr. Jacqualyn Posey asked that I contact pt of more product information, he had previously ordered for a different pt and feels he had an additional item remaining and gave to Ms Hutch. I spoke with pt and asked if she could email a photo to my email and pt states she doesn't have a smart phone. Pt read product description from the protective bag - Dr. Marlaine Hind Gels, Universal Gel Strap (Metatarsal cushion), size L/LX - right, Item# [id number].

## 2019-08-25 NOTE — Progress Notes (Signed)
Subjective: 79 year old female presents the office today for second opinion given multiple digital deformities.  She does not want to have surgery and she does quite a bit of walking.  The toes are overlapping causing discomfort.  She has tried yoga toes at nighttime.  She tries using pads between her toes to make her comfortable.  She also feels that she is walking on rocks in the bottom of her foot. Denies any systemic complaints such as fevers, chills, nausea, vomiting. No acute changes since last appointment, and no other complaints at this time.   Objective: AAO x3, NAD DP/PT pulses palpable bilaterally, CRT less than 3 seconds Bunion deformity is present as well as multiple digital hammertoe contractures are present which are rubbing.  There is no open lesions.  Prominence of metatarsal heads plantarly with atrophy of the fat pad.  There is no other areas of tenderness identified.  Flexor, extensor tendons appear to be intact. No open lesions or pre-ulcerative lesions.  No pain with calf compression, swelling, warmth, erythema  Assessment: Bunion, hammertoe deformities with metatarsalgia  Plan: -All treatment options discussed with the patient including all alternatives, risks, complications.  -We discussed both conservative as well as surgical options.  She does not want proceed with surgery.  For now you to continue offloading pads.  I dispensed metatarsal offloading pad as well as various pads to help separate the toes.  I will also try to order some other pads for her as well.  She wants to hold off on any injections. -Patient encouraged to call the office with any questions, concerns, change in symptoms.   Trula Slade DPM

## 2019-09-03 ENCOUNTER — Telehealth: Payer: Self-pay | Admitting: *Deleted

## 2019-09-03 ENCOUNTER — Telehealth: Payer: Self-pay | Admitting: Podiatry

## 2019-09-03 NOTE — Telephone Encounter (Signed)
Pt left message checking on status of corrected orthotics.   Returned call and told her they were not in but should be in beginning of next week and I would call when they come in.

## 2019-09-03 NOTE — Telephone Encounter (Signed)
Pt states Dr. Jacqualyn Posey was to order supplies for her and she was to get several gel sleeves for the ball of her foot because they take long time to dry, and Dr. Jacqualyn Posey told her to call in 2 weeks if he had not called her back.

## 2019-09-04 NOTE — Telephone Encounter (Signed)
Please let her know they have not come in yet. I ordered them myself and they should be here next week. Thanks.

## 2019-09-04 NOTE — Telephone Encounter (Signed)
I spoke with pt and she states Dr. Jacqualyn Posey was to order several products on 08/20/2019 for her one was similar to yoga toes but smaller and then the the metatarsal gel pads. I told pt Dr. Jacqualyn Posey was not in the office to ask if the products of 08/20/2019 were available, but the gel metatarsal pads were not available.

## 2019-09-04 NOTE — Telephone Encounter (Signed)
Left message informing pt of Dr. Leigh Aurora 09/04/2019 7:50am statement.

## 2019-09-04 NOTE — Telephone Encounter (Signed)
Pt called again and states she would like to know if the other products Dr. Jacqualyn Posey ordered for her were in.

## 2019-09-11 ENCOUNTER — Telehealth: Payer: Self-pay

## 2019-09-11 NOTE — Telephone Encounter (Signed)
Patient called again checking on status of supplies ordered.  Please follow up with patient  Thanks

## 2019-09-11 NOTE — Telephone Encounter (Signed)
I spoke with Dr. Jacqualyn Posey and he checked his order for pt's products and they are expected to arrive Monday 09/14/2019. I checked our stock and lab rooms for the gel metatarsal sleeve and they still are not in stock. I informed pt and she states she will check 09/15/2019.

## 2019-09-11 NOTE — Telephone Encounter (Signed)
The items I ordered will be here Monday.

## 2019-09-15 NOTE — Telephone Encounter (Signed)
Dr. Leigh Aurora orders for the bunion protector and toe separators had arrived. I informed Tonya Turner and she states she only needs the right side products. I place at the front reception for Tonya Turner pick up.

## 2019-09-15 NOTE — Telephone Encounter (Signed)
Pt called states her left foot is starting to go the way of the right foot so she would like the left foot products as well. I replaced the left foot products in the bag for pt to pick up.

## 2019-09-21 LAB — CUP PACEART REMOTE DEVICE CHECK
Date Time Interrogation Session: 20210425012714
Implantable Pulse Generator Implant Date: 20190329

## 2019-09-22 ENCOUNTER — Ambulatory Visit (INDEPENDENT_AMBULATORY_CARE_PROVIDER_SITE_OTHER): Payer: Medicare HMO | Admitting: *Deleted

## 2019-09-22 ENCOUNTER — Encounter: Payer: Self-pay | Admitting: Dermatology

## 2019-09-22 ENCOUNTER — Other Ambulatory Visit: Payer: Self-pay

## 2019-09-22 ENCOUNTER — Ambulatory Visit: Payer: Medicare HMO | Admitting: Dermatology

## 2019-09-22 DIAGNOSIS — D225 Melanocytic nevi of trunk: Secondary | ICD-10-CM

## 2019-09-22 DIAGNOSIS — D229 Melanocytic nevi, unspecified: Secondary | ICD-10-CM

## 2019-09-22 DIAGNOSIS — L720 Epidermal cyst: Secondary | ICD-10-CM

## 2019-09-22 DIAGNOSIS — R55 Syncope and collapse: Secondary | ICD-10-CM | POA: Diagnosis not present

## 2019-09-22 DIAGNOSIS — L729 Follicular cyst of the skin and subcutaneous tissue, unspecified: Secondary | ICD-10-CM

## 2019-09-22 DIAGNOSIS — L309 Dermatitis, unspecified: Secondary | ICD-10-CM | POA: Diagnosis not present

## 2019-09-22 NOTE — Progress Notes (Addendum)
   Follow-Up Visit   Subjective  Tonya Turner is a 79 y.o. female who presents for the following: Cyst (Patient here today for cyst below right ear x several weeks.  Patient denies pain, and drainage.).  Cyst Location: below right ear Duration: 1 year Quality: Bit larger Associated Signs/Symptoms: Never inflamed Modifying Factors:  Severity:  Timing: Context:   The following portions of the chart were reviewed this encounter and updated as appropriate: Tobacco  Allergies  Meds  Problems  Med Hx  Surg Hx  Fam Hx      Objective  Well appearing patient in no apparent distress; mood and affect are within normal limits.  All sun exposed areas plus back examined.  I&D milia in front and behind R ear Assessment & Plan  Cyst of skin Right Submandibular Area  discussed elective removal  Dermatitis Right Buccal Cheek   Prn restart topical Rx  Milia Right Parotid Area  I&D @ pt request  Acne/Milia surgery - Right Parotid Area Procedure risks and benefits were discussed with the patient and verbal consent was obtained. Following prep of the skin on the right parotid area with an alcohol swab, extraction of milium x 1 was performed with a comedone extractor following superficial incision made over their surfaces with a #15 surgical blade. Capillary hemostasis was achieved with 20% aluminum chloride solution. Vaseline ointment was applied to each site. The patient tolerated the procedure well.  Nevus (2) Left Upper Back; Right Upper Back  Annual skin exam annual skin check

## 2019-09-23 NOTE — Progress Notes (Signed)
ILR Remote 

## 2019-09-25 NOTE — Telephone Encounter (Signed)
Left message informing pt 3 of the Bunion silicone sleeves were available at $12.50/sleeve +tax.

## 2019-09-25 NOTE — Telephone Encounter (Signed)
Pt called to check status of the Dr. Granville Lewis large/xlarge silicone bunion sleeves, cost and would like to purchase 3.

## 2019-09-27 ENCOUNTER — Encounter: Payer: Self-pay | Admitting: Dermatology

## 2019-10-01 NOTE — Telephone Encounter (Signed)
Does she want me to order the Gel pads we looked at this morning?

## 2019-10-01 NOTE — Telephone Encounter (Signed)
$  15 each

## 2019-10-01 NOTE — Telephone Encounter (Signed)
Pt called and I informed pt our ordering coordinator states she orders for the other office and has some and will bring 3 tomorrow.

## 2019-10-01 NOTE — Telephone Encounter (Signed)
Dr. Jacqualyn Posey states he will order from pt's catalog, if she would bring it to the office. Left message informing pt of Dr. Leigh Aurora statement.

## 2019-10-01 NOTE — Telephone Encounter (Signed)
Pt states she would like to know when the product would be available for pick up.

## 2019-10-01 NOTE — Telephone Encounter (Signed)
I spoke with pt and she states she doesn't want to drive over to bring the cover for the sleeve, but would like to talk to the person that orders. I told her I would have the order/supply coordinator call.

## 2019-10-01 NOTE — Telephone Encounter (Signed)
How many? And just a pair? I'll check price!

## 2019-10-02 NOTE — Telephone Encounter (Signed)
I spoke with pt and informed three of the requested straps were available at $15.00/strap. pt states she may not be able to pick up today.

## 2019-10-07 DIAGNOSIS — M720 Palmar fascial fibromatosis [Dupuytren]: Secondary | ICD-10-CM | POA: Diagnosis not present

## 2019-10-07 DIAGNOSIS — S63289A Dislocation of proximal interphalangeal joint of unspecified finger, initial encounter: Secondary | ICD-10-CM | POA: Diagnosis not present

## 2019-10-09 DIAGNOSIS — H52203 Unspecified astigmatism, bilateral: Secondary | ICD-10-CM | POA: Diagnosis not present

## 2019-10-09 DIAGNOSIS — H2513 Age-related nuclear cataract, bilateral: Secondary | ICD-10-CM | POA: Diagnosis not present

## 2019-10-15 ENCOUNTER — Telehealth: Payer: Self-pay | Admitting: Internal Medicine

## 2019-10-15 NOTE — Telephone Encounter (Signed)
Called patient back about her message. Patient stated Dr. Rayann Heman has slowly decreased her diltiazem since she started flecainide, and she is wondering if diltiazem can be decreased again. Patient stated she is doing great and she has had no symptoms, or any episodes of irregular rhythms. Patient wanted to know what Dr. Rayann Heman thinks. Will forward to Dr. Rayann Heman for advisement. Informed patient that someone will call her back next week, patient stated this is fine, she is in no hurry, that she was just wondering.

## 2019-10-15 NOTE — Telephone Encounter (Signed)
Pt c/o medication issue:  1. Name of Medication: diltiazem (CARDIZEM CD) 120 MG 24 hr capsule  2. How are you currently taking this medication (dosage and times per day)? As directed   3. Are you having a reaction (difficulty breathing--STAT)? no  4. What is your medication issue? Patient wants to know if she can decrease this medication to 60mg . In her medication chart it states that Diltiazem 120mg  is expired and she is to be taking Diltiazem 30mg  every 6 hours.

## 2019-10-21 ENCOUNTER — Ambulatory Visit (INDEPENDENT_AMBULATORY_CARE_PROVIDER_SITE_OTHER): Payer: Medicare HMO | Admitting: *Deleted

## 2019-10-21 DIAGNOSIS — I480 Paroxysmal atrial fibrillation: Secondary | ICD-10-CM

## 2019-10-21 LAB — CUP PACEART REMOTE DEVICE CHECK
Date Time Interrogation Session: 20210526014705
Implantable Pulse Generator Implant Date: 20190329

## 2019-10-21 NOTE — Progress Notes (Signed)
Carelink Summary Report / Loop Recorder 

## 2019-10-27 NOTE — Telephone Encounter (Signed)
Follow up  Pt called back to follow up. She said she preferred to get Dr. Jackalyn Lombard recommendation and she willing to wait for him to get back next week

## 2019-11-04 ENCOUNTER — Other Ambulatory Visit: Payer: Self-pay | Admitting: Internal Medicine

## 2019-11-05 MED ORDER — DILTIAZEM HCL ER COATED BEADS 120 MG PO CP24: 120.0000 mg | ORAL_CAPSULE | Freq: Every day | ORAL | 0 refills | Status: DC

## 2019-11-08 NOTE — Telephone Encounter (Signed)
We really do not have a sustained release dose less than 120mg .  We could stop diliazed and flecainide if she would like as her arrhythmia has been very well controlled for quite some time.

## 2019-11-09 NOTE — Telephone Encounter (Signed)
Left detailed message for PT.   Advised to stop diltiazem.  Advised the diltiazem 30 mg tablets were just to take as needed.  Will follow up to ensure Pt understands directions

## 2019-11-10 ENCOUNTER — Telehealth: Payer: Self-pay | Admitting: Internal Medicine

## 2019-11-10 ENCOUNTER — Other Ambulatory Visit: Payer: Self-pay

## 2019-11-10 MED ORDER — DILTIAZEM HCL 30 MG PO TABS: 30.0000 mg | ORAL_TABLET | Freq: Four times a day (QID) | ORAL | 5 refills | Status: DC | PRN

## 2019-11-10 NOTE — Telephone Encounter (Signed)
Pt c/o medication issue:  1. Name of Medication: diltiazem (CARDIZEM) 30 MG tablet  2. How are you currently taking this medication (dosage and times per day)? Question regarding how she should be taking  3. Are you having a reaction (difficulty breathing--STAT)? no  4. What is your medication issue? Patient returning Jenny's call in regards to this medication. She wants to know if she should quit cold [place] or gradually take herself off of this medication. She also states the prescription she has for this medication is from 2018 and asked that I send a refill to [facility], refill request sent to refill team.

## 2019-11-10 NOTE — Telephone Encounter (Signed)
*  STAT* If patient is at the pharmacy, call can be transferred to refill team.   1. Which medications need to be refilled? (please list name of each medication and dose if known) diltiazem (CARDIZEM) 30 MG tablet  2. Which pharmacy/location (including street and city if local pharmacy) is medication to be sent to? [place], [place]  3. Do they need a 30 day or 90 day supply? [place]

## 2019-11-11 NOTE — Telephone Encounter (Signed)
Returned call to Pt.  Advised she could discontinue cardizem 120 mg if she would like, or if it would make her more comfortable it would be ok to wean off.  Pt states she has lots of pills, so she will wean off it.  All questions answered.  Pt was very thankful for call.

## 2019-11-17 DIAGNOSIS — I480 Paroxysmal atrial fibrillation: Secondary | ICD-10-CM | POA: Diagnosis not present

## 2019-11-17 DIAGNOSIS — M19049 Primary osteoarthritis, unspecified hand: Secondary | ICD-10-CM | POA: Diagnosis not present

## 2019-11-18 DIAGNOSIS — M720 Palmar fascial fibromatosis [Dupuytren]: Secondary | ICD-10-CM | POA: Diagnosis not present

## 2019-11-18 DIAGNOSIS — S63289A Dislocation of proximal interphalangeal joint of unspecified finger, initial encounter: Secondary | ICD-10-CM | POA: Diagnosis not present

## 2019-11-23 ENCOUNTER — Ambulatory Visit (INDEPENDENT_AMBULATORY_CARE_PROVIDER_SITE_OTHER): Payer: Medicare HMO | Admitting: *Deleted

## 2019-11-23 DIAGNOSIS — I480 Paroxysmal atrial fibrillation: Secondary | ICD-10-CM | POA: Diagnosis not present

## 2019-11-23 LAB — CUP PACEART REMOTE DEVICE CHECK
Date Time Interrogation Session: 20210628025404
Implantable Pulse Generator Implant Date: 20190329

## 2019-11-24 NOTE — Progress Notes (Signed)
Carelink Summary Report / Loop Recorder 

## 2019-11-26 ENCOUNTER — Telehealth: Payer: Self-pay | Admitting: Podiatry

## 2019-11-26 NOTE — Telephone Encounter (Signed)
Pt called stating that valery helped get her the universal xlg gel straps and she stated she wanted 32more and she had the item number [id number]

## 2019-11-27 NOTE — Telephone Encounter (Signed)
I spoke with pt and informed that we did not have the product she was wanting and I had put in the request with the supplies coordinator and for her to call back weekly.

## 2019-12-02 ENCOUNTER — Other Ambulatory Visit: Payer: Self-pay | Admitting: Obstetrics and Gynecology

## 2019-12-02 DIAGNOSIS — Z1231 Encounter for screening mammogram for malignant neoplasm of breast: Secondary | ICD-10-CM

## 2019-12-09 DIAGNOSIS — I480 Paroxysmal atrial fibrillation: Secondary | ICD-10-CM | POA: Diagnosis not present

## 2019-12-09 DIAGNOSIS — M19049 Primary osteoarthritis, unspecified hand: Secondary | ICD-10-CM | POA: Diagnosis not present

## 2019-12-14 DIAGNOSIS — M720 Palmar fascial fibromatosis [Dupuytren]: Secondary | ICD-10-CM | POA: Diagnosis not present

## 2019-12-16 DIAGNOSIS — N83201 Unspecified ovarian cyst, right side: Secondary | ICD-10-CM | POA: Diagnosis not present

## 2019-12-16 DIAGNOSIS — N83202 Unspecified ovarian cyst, left side: Secondary | ICD-10-CM | POA: Diagnosis not present

## 2019-12-17 DIAGNOSIS — I471 Supraventricular tachycardia: Secondary | ICD-10-CM | POA: Diagnosis not present

## 2019-12-17 DIAGNOSIS — Z0000 Encounter for general adult medical examination without abnormal findings: Secondary | ICD-10-CM | POA: Diagnosis not present

## 2019-12-17 DIAGNOSIS — R202 Paresthesia of skin: Secondary | ICD-10-CM | POA: Diagnosis not present

## 2019-12-17 DIAGNOSIS — Z1389 Encounter for screening for other disorder: Secondary | ICD-10-CM | POA: Diagnosis not present

## 2019-12-17 DIAGNOSIS — Z136 Encounter for screening for cardiovascular disorders: Secondary | ICD-10-CM | POA: Diagnosis not present

## 2019-12-18 NOTE — Telephone Encounter (Signed)
I spoke to pt and informed that one of the schedulers had seen her call come through while she was on the phone with another pt, but since she didn't leave a message it did not trigger her to inform me until she saw me this afternoon. I informed pt our office had 3 of the right foot L/LX Dr. Marlaine Hind Universal Gel Strap ITEM# [id number]. Pt states she will pick them up Monday.

## 2019-12-21 DIAGNOSIS — Z96642 Presence of left artificial hip joint: Secondary | ICD-10-CM | POA: Diagnosis not present

## 2019-12-21 DIAGNOSIS — M25652 Stiffness of left hip, not elsewhere classified: Secondary | ICD-10-CM | POA: Diagnosis not present

## 2019-12-28 ENCOUNTER — Ambulatory Visit (INDEPENDENT_AMBULATORY_CARE_PROVIDER_SITE_OTHER): Payer: Medicare HMO | Admitting: *Deleted

## 2019-12-28 DIAGNOSIS — R55 Syncope and collapse: Secondary | ICD-10-CM

## 2019-12-29 ENCOUNTER — Ambulatory Visit
Admission: RE | Admit: 2019-12-29 | Discharge: 2019-12-29 | Disposition: A | Discharge status: Home / Self Care | Payer: Medicare HMO | Source: Ambulatory Visit | Attending: Obstetrics and Gynecology | Admitting: Obstetrics and Gynecology

## 2019-12-29 ENCOUNTER — Other Ambulatory Visit: Payer: Self-pay

## 2019-12-29 DIAGNOSIS — Z1231 Encounter for screening mammogram for malignant neoplasm of breast: Secondary | ICD-10-CM | POA: Diagnosis not present

## 2019-12-29 LAB — CUP PACEART REMOTE DEVICE CHECK
Date Time Interrogation Session: 20210731030452
Implantable Pulse Generator Implant Date: 20190329

## 2019-12-30 NOTE — Progress Notes (Signed)
Carelink Summary Report / Loop Recorder 

## 2020-01-01 DIAGNOSIS — N949 Unspecified condition associated with female genital organs and menstrual cycle: Secondary | ICD-10-CM | POA: Diagnosis not present

## 2020-01-01 DIAGNOSIS — Z01419 Encounter for gynecological examination (general) (routine) without abnormal findings: Secondary | ICD-10-CM | POA: Diagnosis not present

## 2020-01-01 DIAGNOSIS — N83209 Unspecified ovarian cyst, unspecified side: Secondary | ICD-10-CM | POA: Diagnosis not present

## 2020-02-01 LAB — CUP PACEART REMOTE DEVICE CHECK
Date Time Interrogation Session: 20210902030633
Implantable Pulse Generator Implant Date: 20190329

## 2020-02-02 ENCOUNTER — Ambulatory Visit (INDEPENDENT_AMBULATORY_CARE_PROVIDER_SITE_OTHER): Payer: Medicare HMO | Admitting: *Deleted

## 2020-02-02 DIAGNOSIS — I480 Paroxysmal atrial fibrillation: Secondary | ICD-10-CM | POA: Diagnosis not present

## 2020-02-04 NOTE — Progress Notes (Signed)
Carelink Summary Report / Loop Recorder 

## 2020-02-12 ENCOUNTER — Telehealth: Payer: Self-pay

## 2020-02-12 NOTE — Telephone Encounter (Signed)
Spoke with pt regarding virtual appt on 02/15/20. Pt stated she needs to come in for an EKG. Pt also stated she is very unhappy that her appt is virtual and would like to cancel. Pt was advise to keep appt, but declined. Pt was informed that her appt will be cancelled and [facility] would reach out to reschedule for an office visit. Pt questions and concerns were addressed.

## 2020-02-15 ENCOUNTER — Telehealth: Payer: Medicare HMO | Admitting: Internal Medicine

## 2020-03-03 DIAGNOSIS — Z23 Encounter for immunization: Secondary | ICD-10-CM | POA: Diagnosis not present

## 2020-03-04 LAB — CUP PACEART REMOTE DEVICE CHECK
Date Time Interrogation Session: 20211005030908
Implantable Pulse Generator Implant Date: 20190329

## 2020-03-07 ENCOUNTER — Encounter: Payer: Self-pay | Admitting: Internal Medicine

## 2020-03-07 ENCOUNTER — Ambulatory Visit (INDEPENDENT_AMBULATORY_CARE_PROVIDER_SITE_OTHER): Payer: Medicare HMO | Admitting: Internal Medicine

## 2020-03-07 ENCOUNTER — Ambulatory Visit (INDEPENDENT_AMBULATORY_CARE_PROVIDER_SITE_OTHER): Payer: Medicare HMO

## 2020-03-07 ENCOUNTER — Other Ambulatory Visit: Payer: Self-pay

## 2020-03-07 VITALS — BP 104/72 | HR 82 | Ht 65.0 in | Wt 128.6 lb

## 2020-03-07 DIAGNOSIS — I480 Paroxysmal atrial fibrillation: Secondary | ICD-10-CM

## 2020-03-07 DIAGNOSIS — I471 Supraventricular tachycardia: Secondary | ICD-10-CM | POA: Diagnosis not present

## 2020-03-07 LAB — CUP PACEART INCLINIC DEVICE CHECK
Date Time Interrogation Session: 20211011130617
Implantable Pulse Generator Implant Date: 20190329

## 2020-03-07 NOTE — Progress Notes (Signed)
PCP: Lavone Orn, MD Primary Cardiologist: Dr Marlou Porch Primary EP: Dr Rayann Heman  Tonya Turner is a 79 y.o. female who presents today for routine electrophysiology followup.  Since last being seen in our clinic, the patient reports doing very well.  Today, she denies symptoms of palpitations, chest pain, shortness of breath,  lower extremity edema, dizziness, presyncope, or syncope.  The patient is otherwise without complaint today.   Past Medical History:  Diagnosis Date  . Arthritis    "hands-mild; left hip" (09/28/2015)  . Atypical mole 01/30/2017   Left Abdomen(mild)  . Basal cell carcinoma of lower leg    "cut out on both shins"  . BCC (basal cell carcinoma of skin) 01/12/2008   Right Lower Leg (curet and 5FU)  . DDD (degenerative disc disease), lumbar   . Degenerative disc disease, thoracic   . Eczema    history of  . Edema of extremities    usually lower extremities- bilateral  . Family history of adverse reaction to anesthesia    "brother is allergic and gets sick"  . Migraine    rare occular migraine- none recent (09/28/2015)  . PAF (paroxysmal atrial fibrillation) ([place]) 01/30/2011   ekg from Dr Delene Ruffini office is reviewed and confirms afib  . Pneumonia 1940s  . Primary osteoarthritis of left hip   . Unsteady gait   . Varicose vein    hx. of-    Past Surgical History:  Procedure Laterality Date  . BASAL CELL CARCINOMA EXCISION Bilateral    "shins"  . COLONOSCOPY WITH PROPOFOL N/A 10/28/2012   Procedure: COLONOSCOPY WITH PROPOFOL;  Surgeon: Garlan Fair, MD;  Location: [facility];  Service: Endoscopy;  Laterality: N/A;  . DILATION AND CURETTAGE OF UTERUS    . JOINT REPLACEMENT    . LOOP RECORDER INSERTION N/A 08/23/2017   Procedure: LOOP RECORDER INSERTION;  Surgeon: Thompson Grayer, MD;  Location: [facility];  Service: Cardiovascular;  Laterality: N/A;  . TOTAL HIP ARTHROPLASTY Left 09/27/2015  . TOTAL HIP ARTHROPLASTY Left 09/27/2015   Procedure: TOTAL HIP  ARTHROPLASTY ANTERIOR APPROACH;  Surgeon: Melrose Nakayama, MD;  Location: [facility];  Service: Orthopedics;  Laterality: Left;  . TUBAL LIGATION  1970s  . VARICOSE VEIN SURGERY Left 1970s    ROS- all systems are reviewed and negatives except as per HPI above  Current Outpatient Medications  Medication Sig Dispense Refill  . Biotin w/ Vitamins C & E (HAIR/SKIN/NAILS PO) Take 1 tablet by mouth 2 (two) times daily.    . Calcium-Magnesium-Zinc (CAL-MAG-ZINC PO) Take 1 tablet by mouth daily with lunch.    . conjugated estrogens (PREMARIN) vaginal cream Place 1 Applicatorful vaginally 2 (two) times a week. Saturday & Tuesday.    . diltiazem (CARDIZEM) 30 MG tablet Take 1 tablet (30 mg total) by mouth every 6 (six) hours as needed (for rapid heart beat). 60 tablet 5  . flecainide (TAMBOCOR) 50 MG tablet TAKE 1 TABLET TWICE DAILY 180 tablet 3  . Glucosamine-Chondroitin (COSAMIN DS PO) Take 0.5 tablets by mouth daily at 3 pm.    . Multiple Vitamin (MULTIVITAMIN WITH MINERALS) TABS tablet Take 0.5 tablets by mouth daily at 3 pm.     . triamcinolone cream (KENALOG) 0.1 % Apply 1 application topically daily as needed (for skin irritation/rash).      No current facility-administered medications for this visit.    Physical Exam: Vitals:   03/07/20 1220  BP: 104/72  Pulse: 82  SpO2: 96%  Weight:  128 lb 9.6 oz (58.3 kg)  Height: 5\' 5"  (1.651 m)    GEN- The patient is well appearing, alert and oriented x 3 today.   Head- normocephalic, atraumatic Eyes-  Sclera clear, conjunctiva pink Ears- hearing intact Oropharynx- clear Lungs- Clear to ausculation bilaterally, normal work of breathing Heart- Regular rate and rhythm, no murmurs, rubs or gallops, PMI not laterally displaced GI- soft, NT, ND, + BS Extremities- no clubbing, cyanosis, or edema  Wt Readings from Last 3 Encounters:  03/07/20 128 lb 9.6 oz (58.3 kg)  02/09/19 140 lb (63.5 kg)  06/13/18 147 lb 3.2 oz (66.8 kg)    EKG tracing  ordered today is personally reviewed and shows sinus 82 bpm, PR 158 msec, QRS 84 msec, QTc 422 msec  Assessment and Plan:  1. Paroxysmal atrial fibrillation Well controlled off AAD therapy (only single episode in 2012).  LINQ AF burden is 0%,  chads2vasc score is 3.  We will consider restarting [place] if her AF burden increases.  2. SVT Well controlled with low dose flecainide We will follow her closely on this medicine to avoid toxicity I did offer that she could reduce flecainide to 25mg  bid today.  She is reluctant to make this change.  3. Remote syncope Vasovagal by history No arrhythmias by ILR of concern  Risks, benefits and potential toxicities for medications prescribed and/or refilled reviewed with patient today.   Return in a year to see EP PA  Thompson Grayer MD, [facility] 03/07/2020 12:31 PM

## 2020-03-07 NOTE — Patient Instructions (Signed)
Medication Instructions:  Your physician recommends that you continue on your current medications as directed. Please refer to the Current Medication list given to you today.  *If you need a refill on your cardiac medications before your next appointment, please call your pharmacy*  Lab Work: None ordered.  If you have labs (blood work) drawn today and your tests are completely normal, you will receive your results only by: Marland Kitchen MyChart Message (if you have MyChart) OR . A paper copy in the mail If you have any lab test that is abnormal or we need to change your treatment, we will call you to review the results.  Testing/Procedures: None ordered.  Follow-Up: At [facility], you and your health needs are our priority.  As part of our continuing mission to provide you with exceptional heart care, we have created designated Provider Care Teams.  These Care Teams include your primary Cardiologist (physician) and Advanced Practice Providers (APPs -  Physician Assistants and Nurse Practitioners) who all work together to provide you with the care you need, when you need it.  We recommend signing up for the patient portal called "MyChart".  Sign up information is provided on this After Visit Summary.  MyChart is used to connect with patients for Virtual Visits (Telemedicine).  Patients are able to view lab/test results, encounter notes, upcoming appointments, etc.  Non-urgent messages can be sent to your provider as well.   To learn more about what you can do with MyChart, go to [email or web address].    Your next appointment:   Your physician wants you to follow-up in: 12 months with Tommye Standard. You will receive a reminder letter in the mail two months in advance. If you don't receive a letter, please call our office to schedule the follow-up appointment.    Other Instructions:

## 2020-03-09 NOTE — Progress Notes (Signed)
Carelink Summary Report / Loop Recorder 

## 2020-04-03 LAB — CUP PACEART REMOTE DEVICE CHECK
Date Time Interrogation Session: 20211107022925
Implantable Pulse Generator Implant Date: 20190329

## 2020-04-11 ENCOUNTER — Ambulatory Visit (INDEPENDENT_AMBULATORY_CARE_PROVIDER_SITE_OTHER): Payer: Medicare HMO

## 2020-04-11 DIAGNOSIS — I480 Paroxysmal atrial fibrillation: Secondary | ICD-10-CM

## 2020-04-13 NOTE — Progress Notes (Signed)
Carelink Summary Report / Loop Recorder 

## 2020-05-04 DIAGNOSIS — S63289A Dislocation of proximal interphalangeal joint of unspecified finger, initial encounter: Secondary | ICD-10-CM | POA: Diagnosis not present

## 2020-05-04 DIAGNOSIS — M720 Palmar fascial fibromatosis [Dupuytren]: Secondary | ICD-10-CM | POA: Diagnosis not present

## 2020-05-16 ENCOUNTER — Ambulatory Visit (INDEPENDENT_AMBULATORY_CARE_PROVIDER_SITE_OTHER): Payer: Medicare HMO

## 2020-05-16 DIAGNOSIS — I480 Paroxysmal atrial fibrillation: Secondary | ICD-10-CM

## 2020-05-17 LAB — CUP PACEART REMOTE DEVICE CHECK
Date Time Interrogation Session: 20211218232929
Implantable Pulse Generator Implant Date: 20190329

## 2020-05-20 ENCOUNTER — Other Ambulatory Visit: Payer: Self-pay | Admitting: Cardiology

## 2020-05-25 ENCOUNTER — Other Ambulatory Visit: Payer: Self-pay

## 2020-05-25 MED ORDER — FLECAINIDE ACETATE 50 MG PO TABS
50.0000 mg | ORAL_TABLET | Freq: Two times a day (BID) | ORAL | 3 refills | Status: DC
Start: 2020-05-25 — End: 2021-01-31

## 2020-05-31 NOTE — Progress Notes (Signed)
Carelink Summary Report / Loop Recorder 

## 2020-06-15 DIAGNOSIS — M19049 Primary osteoarthritis, unspecified hand: Secondary | ICD-10-CM | POA: Diagnosis not present

## 2020-06-15 DIAGNOSIS — I480 Paroxysmal atrial fibrillation: Secondary | ICD-10-CM | POA: Diagnosis not present

## 2020-06-19 LAB — CUP PACEART REMOTE DEVICE CHECK
Date Time Interrogation Session: 20220121005521
Implantable Pulse Generator Implant Date: 20190329

## 2020-06-20 ENCOUNTER — Ambulatory Visit (INDEPENDENT_AMBULATORY_CARE_PROVIDER_SITE_OTHER): Payer: Medicare HMO

## 2020-06-20 DIAGNOSIS — I480 Paroxysmal atrial fibrillation: Secondary | ICD-10-CM

## 2020-06-30 NOTE — Progress Notes (Signed)
Carelink Summary Report / Loop Recorder 

## 2020-07-04 ENCOUNTER — Ambulatory Visit: Payer: Medicare HMO | Admitting: Podiatry

## 2020-07-04 ENCOUNTER — Other Ambulatory Visit: Payer: Self-pay

## 2020-07-04 DIAGNOSIS — M2041 Other hammer toe(s) (acquired), right foot: Secondary | ICD-10-CM | POA: Diagnosis not present

## 2020-07-04 DIAGNOSIS — M21621 Bunionette of right foot: Secondary | ICD-10-CM

## 2020-07-04 DIAGNOSIS — M7742 Metatarsalgia, left foot: Secondary | ICD-10-CM | POA: Diagnosis not present

## 2020-07-04 DIAGNOSIS — Q828 Other specified congenital malformations of skin: Secondary | ICD-10-CM | POA: Diagnosis not present

## 2020-07-04 DIAGNOSIS — M21619 Bunion of unspecified foot: Secondary | ICD-10-CM

## 2020-07-04 DIAGNOSIS — M7741 Metatarsalgia, right foot: Secondary | ICD-10-CM | POA: Diagnosis not present

## 2020-07-11 NOTE — Progress Notes (Signed)
Subjective: 80 year old female presents the office today for concerns of developing some inside of her right foot.  She is concerned about a possible tailor's bunion.  She also wants to discuss other options to help separate the toes.  Appears to be done multiple offloading pads of the toes which are helpful.  She also states the silicone metatarsal pads very helpful.  No recent injury or falls or changes otherwise since I last saw her. Denies any systemic complaints such as fevers, chills, nausea, vomiting. No acute changes since last appointment, and no other complaints at this time.   Objective: AAO x3, NAD DP/PT pulses palpable bilaterally, CRT less than 3 seconds There is prominence of metatarsal heads plantarly with atrophy of fat pad.  Multiple digital deformities are evident.  Tailor's bunion is also present.  Mild hyperkeratotic tissue submetatarsal 5 on the right foot.  There is no underlying ulceration drainage or ascending infection.  MMT 5/5. No pain with calf compression, swelling, warmth, erythema  Assessment: 80 year old female with multiple deformities, tailor's bunion, hyperkeratotic lesion  Plan: -All treatment options discussed with the patient including all alternatives, risks, complications.  -I debrided the hyperkeratotic lesion on the right foot x1 without any complications or bleeding.  Recommended offloading and moisturizer daily. -Discussed further offloading for the hammertoe deformities as well as tailor's bunion.  Discussed shoe modifications as well. -Patient encouraged to call the office with any questions, concerns, change in symptoms.   Trula Slade DPM

## 2020-07-18 ENCOUNTER — Other Ambulatory Visit: Payer: Self-pay

## 2020-07-18 ENCOUNTER — Ambulatory Visit: Payer: Medicare HMO | Admitting: Orthotics

## 2020-07-18 DIAGNOSIS — M2041 Other hammer toe(s) (acquired), right foot: Secondary | ICD-10-CM

## 2020-07-18 DIAGNOSIS — M21621 Bunionette of right foot: Secondary | ICD-10-CM

## 2020-07-18 DIAGNOSIS — M21619 Bunion of unspecified foot: Secondary | ICD-10-CM

## 2020-07-18 NOTE — Progress Notes (Signed)
Sending f/o back to [place] for adjustments; on a refurb, need to add met BAR instead of met pad.

## 2020-07-23 LAB — CUP PACEART REMOTE DEVICE CHECK
Date Time Interrogation Session: 20220223021120
Implantable Pulse Generator Implant Date: 20190329

## 2020-07-25 ENCOUNTER — Ambulatory Visit (INDEPENDENT_AMBULATORY_CARE_PROVIDER_SITE_OTHER): Payer: Medicare HMO

## 2020-07-25 DIAGNOSIS — I480 Paroxysmal atrial fibrillation: Secondary | ICD-10-CM

## 2020-08-01 NOTE — Progress Notes (Signed)
Carelink Summary Report / Loop Recorder 

## 2020-08-08 ENCOUNTER — Ambulatory Visit (INDEPENDENT_AMBULATORY_CARE_PROVIDER_SITE_OTHER): Payer: Medicare HMO | Admitting: Podiatry

## 2020-08-08 ENCOUNTER — Other Ambulatory Visit: Payer: Self-pay

## 2020-08-08 DIAGNOSIS — M7742 Metatarsalgia, left foot: Secondary | ICD-10-CM

## 2020-08-08 DIAGNOSIS — M7741 Metatarsalgia, right foot: Secondary | ICD-10-CM

## 2020-08-08 DIAGNOSIS — N9489 Other specified conditions associated with female genital organs and menstrual cycle: Secondary | ICD-10-CM | POA: Insufficient documentation

## 2020-08-08 DIAGNOSIS — N952 Postmenopausal atrophic vaginitis: Secondary | ICD-10-CM | POA: Insufficient documentation

## 2020-08-08 DIAGNOSIS — N949 Unspecified condition associated with female genital organs and menstrual cycle: Secondary | ICD-10-CM | POA: Insufficient documentation

## 2020-08-08 DIAGNOSIS — C4491 Basal cell carcinoma of skin, unspecified: Secondary | ICD-10-CM | POA: Insufficient documentation

## 2020-08-08 DIAGNOSIS — Z86010 Personal history of colonic polyps: Secondary | ICD-10-CM | POA: Insufficient documentation

## 2020-08-08 DIAGNOSIS — R202 Paresthesia of skin: Secondary | ICD-10-CM | POA: Insufficient documentation

## 2020-08-08 DIAGNOSIS — K5730 Diverticulosis of large intestine without perforation or abscess without bleeding: Secondary | ICD-10-CM | POA: Insufficient documentation

## 2020-08-08 DIAGNOSIS — H9190 Unspecified hearing loss, unspecified ear: Secondary | ICD-10-CM | POA: Insufficient documentation

## 2020-08-08 DIAGNOSIS — L719 Rosacea, unspecified: Secondary | ICD-10-CM | POA: Insufficient documentation

## 2020-08-08 DIAGNOSIS — M779 Enthesopathy, unspecified: Secondary | ICD-10-CM

## 2020-08-08 DIAGNOSIS — E559 Vitamin D deficiency, unspecified: Secondary | ICD-10-CM | POA: Insufficient documentation

## 2020-08-08 DIAGNOSIS — M2041 Other hammer toe(s) (acquired), right foot: Secondary | ICD-10-CM | POA: Insufficient documentation

## 2020-08-08 DIAGNOSIS — M19049 Primary osteoarthritis, unspecified hand: Secondary | ICD-10-CM | POA: Insufficient documentation

## 2020-08-08 DIAGNOSIS — M5136 Other intervertebral disc degeneration, lumbar region: Secondary | ICD-10-CM | POA: Insufficient documentation

## 2020-08-08 DIAGNOSIS — L509 Urticaria, unspecified: Secondary | ICD-10-CM | POA: Insufficient documentation

## 2020-08-08 NOTE — Progress Notes (Signed)
Patient presents today for orthotic pick up. Patient voices no new complaints.  Orthotics were fitted to patient's feet. Patient stated that the left orthotic fit good and the right orthotic felt lumpy.  I stated to the patient that I would send them back to get the lump out of the right orthotic.  Patient will be contacted when the orthotics are ready for pick up.

## 2020-08-08 NOTE — Patient Instructions (Signed)

## 2020-08-22 ENCOUNTER — Ambulatory Visit (INDEPENDENT_AMBULATORY_CARE_PROVIDER_SITE_OTHER): Payer: Medicare HMO

## 2020-08-22 DIAGNOSIS — R55 Syncope and collapse: Secondary | ICD-10-CM

## 2020-08-22 LAB — CUP PACEART REMOTE DEVICE CHECK
Date Time Interrogation Session: 20220328031350
Implantable Pulse Generator Implant Date: 20190329

## 2020-09-05 ENCOUNTER — Ambulatory Visit (INDEPENDENT_AMBULATORY_CARE_PROVIDER_SITE_OTHER): Payer: Medicare HMO | Admitting: Podiatry

## 2020-09-05 ENCOUNTER — Other Ambulatory Visit: Payer: Self-pay

## 2020-09-05 DIAGNOSIS — M7741 Metatarsalgia, right foot: Secondary | ICD-10-CM

## 2020-09-05 DIAGNOSIS — M7742 Metatarsalgia, left foot: Secondary | ICD-10-CM

## 2020-09-05 NOTE — Progress Notes (Signed)
The patient presents today for orthotic pick up. Patient voices no new complaints.  Orthotics were fitted to patient's feet. Patient stated that there was not any discomfort to the left orthotic but the right orthotic seemed to be to puffy.  I stated to the patient that the right had a small metatarsal pad in it and the left orthotic had a metatarsal bar in it and patient wanted the right orthotic the same as the pair she has previous which had the metatarsal bar in the right orthotic.  Patient will be contacted when the orthotics are ready for pick up.

## 2020-09-05 NOTE — Progress Notes (Signed)
Carelink Summary Report / Loop Recorder 

## 2020-09-08 DIAGNOSIS — I480 Paroxysmal atrial fibrillation: Secondary | ICD-10-CM | POA: Diagnosis not present

## 2020-09-08 DIAGNOSIS — M19049 Primary osteoarthritis, unspecified hand: Secondary | ICD-10-CM | POA: Diagnosis not present

## 2020-09-20 ENCOUNTER — Other Ambulatory Visit: Payer: Self-pay

## 2020-09-20 ENCOUNTER — Ambulatory Visit (INDEPENDENT_AMBULATORY_CARE_PROVIDER_SITE_OTHER): Payer: Medicare HMO | Admitting: Podiatry

## 2020-09-20 DIAGNOSIS — M7742 Metatarsalgia, left foot: Secondary | ICD-10-CM

## 2020-09-20 DIAGNOSIS — M7741 Metatarsalgia, right foot: Secondary | ICD-10-CM

## 2020-09-20 DIAGNOSIS — M2041 Other hammer toe(s) (acquired), right foot: Secondary | ICD-10-CM

## 2020-09-20 NOTE — Progress Notes (Signed)
Patient presents today for orthotic pick up. Patient states that the orthotics do not seem to have enough softness and bouncy ness to the front part of the orthotic.  I stated to the patient per Dr Jacqualyn Posey we could try another type of material and patient was ok with that and I did keep the orthotics.  Patient will be contacted when the orthotics are ready for pick up

## 2020-09-26 ENCOUNTER — Ambulatory Visit (INDEPENDENT_AMBULATORY_CARE_PROVIDER_SITE_OTHER): Payer: Medicare HMO

## 2020-09-26 DIAGNOSIS — R55 Syncope and collapse: Secondary | ICD-10-CM

## 2020-09-27 LAB — CUP PACEART REMOTE DEVICE CHECK
Date Time Interrogation Session: 20220430031424
Implantable Pulse Generator Implant Date: 20190329

## 2020-10-03 ENCOUNTER — Encounter: Payer: Self-pay | Admitting: Podiatry

## 2020-10-03 ENCOUNTER — Other Ambulatory Visit: Payer: Self-pay

## 2020-10-03 ENCOUNTER — Ambulatory Visit: Payer: Medicare HMO | Admitting: Podiatry

## 2020-10-03 DIAGNOSIS — Q828 Other specified congenital malformations of skin: Secondary | ICD-10-CM

## 2020-10-05 ENCOUNTER — Telehealth: Payer: Self-pay | Admitting: Podiatry

## 2020-10-05 NOTE — Telephone Encounter (Signed)
Corrected orthotics in... lvm for pt to call to discuss.

## 2020-10-08 NOTE — Progress Notes (Signed)
Subjective: 80 year old female presents the office today for concerns of a "clogged pore" causing discomfort mostly of the right foot, lateral aspect.  She states the area started to become more uncomfortable.  Denies any swelling or redness or any drainage.  She has been using the offloading pads for the toes which has been helpful. Denies any systemic complaints such as fevers, chills, nausea, vomiting. No acute changes since last appointment, and no other complaints at this time.   Objective: AAO x3, NAD DP/PT pulses palpable bilaterally, CRT less than 3 seconds There is an area of thick, annular, hyperkeratotic tissue right fifth metatarsal base on the right side and upon debridement there is no underlying ulceration drainage or any signs of infection.  Tailor's bunion is present with prominence of metatarsal heads with atrophy of the fat pad.  No pain with calf compression, swelling, warmth, erythema  Assessment: Porokeratosis right foot  Plan: -All treatment options discussed with the patient including all alternatives, risks, complications.  -B lesion with any complications or bleeding.  Continue moisturizer and offloading. -Patient encouraged to call the office with any questions, concerns, change in symptoms.   Trula Slade DPM

## 2020-10-10 ENCOUNTER — Ambulatory Visit (INDEPENDENT_AMBULATORY_CARE_PROVIDER_SITE_OTHER): Payer: Medicare HMO | Admitting: *Deleted

## 2020-10-10 ENCOUNTER — Other Ambulatory Visit: Payer: Self-pay

## 2020-10-10 DIAGNOSIS — M7742 Metatarsalgia, left foot: Secondary | ICD-10-CM

## 2020-10-10 DIAGNOSIS — M7741 Metatarsalgia, right foot: Secondary | ICD-10-CM

## 2020-10-10 NOTE — Progress Notes (Signed)
Patient presents today to pick up custom molded foot orthotics, diagnosed with metatarsalgia by Dr. Jacqualyn Posey.   Orthotics were dispensed and fit was satisfactory. Reviewed instructions for break-in and wear. Written instructions given to patient.  Patient will follow up as needed.   [facility] - order # [id number]

## 2020-10-12 DIAGNOSIS — H52203 Unspecified astigmatism, bilateral: Secondary | ICD-10-CM | POA: Diagnosis not present

## 2020-10-12 DIAGNOSIS — H2513 Age-related nuclear cataract, bilateral: Secondary | ICD-10-CM | POA: Diagnosis not present

## 2020-10-18 NOTE — Progress Notes (Signed)
Carelink Summary Report / Loop Recorder 

## 2020-10-25 ENCOUNTER — Telehealth: Payer: Self-pay | Admitting: Podiatry

## 2020-10-25 NOTE — Telephone Encounter (Signed)
Pt left message for me too call her back on 5.26 about getting the other pair of inserts she has refurbished. She said the ones she just got seem to be working good.  I returned call and apologized as I was out of the office but we definitely could refurbish them for the 90.00 fee and I would like her to take a picture of the current ones that are working for her to send with the other ones that we are refurbishing. She said she does not have a smart phone so I told her to bring in both pairs and I would take the picture to send with the ones we are refurbishing.

## 2020-10-30 LAB — CUP PACEART REMOTE DEVICE CHECK
Date Time Interrogation Session: 20220602031554
Implantable Pulse Generator Implant Date: 20190329

## 2020-10-31 ENCOUNTER — Ambulatory Visit (INDEPENDENT_AMBULATORY_CARE_PROVIDER_SITE_OTHER): Payer: Medicare HMO

## 2020-10-31 DIAGNOSIS — R55 Syncope and collapse: Secondary | ICD-10-CM | POA: Diagnosis not present

## 2020-11-02 DIAGNOSIS — S63289A Dislocation of proximal interphalangeal joint of unspecified finger, initial encounter: Secondary | ICD-10-CM | POA: Diagnosis not present

## 2020-11-02 DIAGNOSIS — M720 Palmar fascial fibromatosis [Dupuytren]: Secondary | ICD-10-CM | POA: Diagnosis not present

## 2020-11-18 DIAGNOSIS — M2041 Other hammer toe(s) (acquired), right foot: Secondary | ICD-10-CM

## 2020-11-18 DIAGNOSIS — I480 Paroxysmal atrial fibrillation: Secondary | ICD-10-CM | POA: Diagnosis not present

## 2020-11-18 DIAGNOSIS — M19049 Primary osteoarthritis, unspecified hand: Secondary | ICD-10-CM | POA: Diagnosis not present

## 2020-11-22 NOTE — Progress Notes (Signed)
Carelink Summary Report / Loop Recorder 

## 2020-11-24 ENCOUNTER — Other Ambulatory Visit: Payer: Self-pay | Admitting: Obstetrics and Gynecology

## 2020-11-24 DIAGNOSIS — Z1231 Encounter for screening mammogram for malignant neoplasm of breast: Secondary | ICD-10-CM

## 2020-12-05 ENCOUNTER — Ambulatory Visit (INDEPENDENT_AMBULATORY_CARE_PROVIDER_SITE_OTHER): Payer: Medicare HMO

## 2020-12-05 DIAGNOSIS — I480 Paroxysmal atrial fibrillation: Secondary | ICD-10-CM | POA: Diagnosis not present

## 2020-12-05 LAB — CUP PACEART REMOTE DEVICE CHECK
Date Time Interrogation Session: 20220705031642
Implantable Pulse Generator Implant Date: 20190329

## 2020-12-06 ENCOUNTER — Telehealth: Payer: Medicare HMO | Admitting: Podiatry

## 2020-12-06 NOTE — Telephone Encounter (Signed)
Refurbished orthotics in.. lvm for pt ok to pick up.Marland KitchenMarland Kitchen

## 2020-12-19 DIAGNOSIS — Z0000 Encounter for general adult medical examination without abnormal findings: Secondary | ICD-10-CM | POA: Diagnosis not present

## 2020-12-19 DIAGNOSIS — Z1389 Encounter for screening for other disorder: Secondary | ICD-10-CM | POA: Diagnosis not present

## 2020-12-19 DIAGNOSIS — I471 Supraventricular tachycardia: Secondary | ICD-10-CM | POA: Diagnosis not present

## 2020-12-20 DIAGNOSIS — N83202 Unspecified ovarian cyst, left side: Secondary | ICD-10-CM | POA: Diagnosis not present

## 2020-12-20 DIAGNOSIS — N83201 Unspecified ovarian cyst, right side: Secondary | ICD-10-CM | POA: Diagnosis not present

## 2020-12-27 NOTE — Progress Notes (Signed)
Carelink Summary Report / Loop Recorder 

## 2021-01-03 DIAGNOSIS — N83209 Unspecified ovarian cyst, unspecified side: Secondary | ICD-10-CM | POA: Diagnosis not present

## 2021-01-03 DIAGNOSIS — Z01419 Encounter for gynecological examination (general) (routine) without abnormal findings: Secondary | ICD-10-CM | POA: Diagnosis not present

## 2021-01-05 ENCOUNTER — Other Ambulatory Visit: Payer: Self-pay

## 2021-01-05 ENCOUNTER — Telehealth: Payer: Self-pay | Admitting: *Deleted

## 2021-01-05 ENCOUNTER — Encounter: Payer: Self-pay | Admitting: Physician Assistant

## 2021-01-05 ENCOUNTER — Ambulatory Visit: Payer: Medicare HMO | Admitting: Physician Assistant

## 2021-01-05 DIAGNOSIS — D2371 Other benign neoplasm of skin of right lower limb, including hip: Secondary | ICD-10-CM

## 2021-01-05 DIAGNOSIS — L089 Local infection of the skin and subcutaneous tissue, unspecified: Secondary | ICD-10-CM

## 2021-01-05 DIAGNOSIS — D485 Neoplasm of uncertain behavior of skin: Secondary | ICD-10-CM

## 2021-01-05 MED ORDER — ALCLOMETASONE DIPROPIONATE 0.05 % EX OINT: TOPICAL_OINTMENT | Freq: Two times a day (BID) | CUTANEOUS | 0 refills | Status: DC

## 2021-01-05 MED ORDER — KETOCONAZOLE 2 % EX CREA: 1.0000 "application " | TOPICAL_CREAM | Freq: Two times a day (BID) | CUTANEOUS | 0 refills | Status: AC

## 2021-01-05 NOTE — Patient Instructions (Signed)

## 2021-01-05 NOTE — Telephone Encounter (Signed)
Called and left message for patient to call us back- checking to see if she was able to get the alclometasone at an affordable price.

## 2021-01-09 ENCOUNTER — Ambulatory Visit (INDEPENDENT_AMBULATORY_CARE_PROVIDER_SITE_OTHER): Payer: Medicare HMO

## 2021-01-09 DIAGNOSIS — I480 Paroxysmal atrial fibrillation: Secondary | ICD-10-CM | POA: Diagnosis not present

## 2021-01-09 DIAGNOSIS — M19049 Primary osteoarthritis, unspecified hand: Secondary | ICD-10-CM | POA: Diagnosis not present

## 2021-01-09 NOTE — Telephone Encounter (Signed)
Phone call to [place] to give them the GoodRx card information. Pharmacist aware and card applied.

## 2021-01-09 NOTE — Telephone Encounter (Signed)
Phone call from patient stating that her pharmacy [facility] didn't have the Alclometasone ointment in stock. Patient states that she had the prescription transferred to [facility] and they had to order it. Patient also stated that [facility] told her the prescription would be $40 and that now it's in stock. I informed patient that per GoodRx she shouldn't pay any more than $20 for the prescription and that I would call [facility] and give them the GoodRx number and she can go pick it up.  Patient understands and states she'll pick the prescription up later.

## 2021-01-10 ENCOUNTER — Encounter: Payer: Self-pay | Admitting: Physician Assistant

## 2021-01-10 LAB — CUP PACEART REMOTE DEVICE CHECK
Date Time Interrogation Session: 20220814232153
Implantable Pulse Generator Implant Date: 20190329

## 2021-01-10 NOTE — Progress Notes (Signed)
   Follow-Up Visit   Subjective  Tonya Turner is a 80 y.o. female who presents for the following: Skin Problem (Patient here today for possible infection on the left outer mouth x 2 weeks patient has only used Vaseline. No one else in the home has any infections, no household changes. Per patient she hasn't dealt with this in years. ).   The following portions of the chart were reviewed this encounter and updated as appropriate:  Tobacco  Allergies  Meds  Problems  Med Hx  Surg Hx  Fam Hx      Objective  Well appearing patient in no apparent distress; mood and affect are within normal limits.  A full examination was performed including scalp, head, eyes, ears, nose, lips, neck, chest, axillae, abdomen, back, buttocks, bilateral upper extremities, bilateral lower extremities, hands, feet, fingers, toes, fingernails, and toenails. All findings within normal limits unless otherwise noted below.  Left Lower [place] Lip erythema  right hip Bichromic dark nested macule.       Assessment & Plan  Local infection of skin and subcutaneous tissue Left Lower Vermilion Lip  alclomethasone (ACLOVATE) 0.05 % ointment - Left Lower Vermilion Lip Apply topically 2 (two) times daily.  ketoconazole (NIZORAL) 2 % cream - Left Lower Vermilion Lip Apply 1 application topically 2 (two) times daily. Apply to corner of mouth daily  Anaerobic and Aerobic Culture - Left Lower Vermilion Lip  Neoplasm of uncertain behavior of skin right hip  Skin / nail biopsy Type of biopsy: tangential   Informed consent: discussed and consent obtained   Timeout: patient name, date of birth, surgical site, and procedure verified   Procedure prep:  Patient was prepped and draped in usual sterile fashion (Non sterile) Prep type:  Chlorhexidine Anesthesia: the lesion was anesthetized in a standard fashion   Anesthetic:  1% lidocaine w/ epinephrine 1-100,000 local infiltration Instrument used: flexible  razor blade   Outcome: patient tolerated procedure well   Post-procedure details: wound care instructions given    Specimen 1 - Surgical pathology Differential Diagnosis: atypical Check Margins: No    I, Etheline Geppert, PA-C, have reviewed all documentation's for this visit.  The documentation on 01/10/21 for the exam, diagnosis, procedures and orders are all accurate and complete.

## 2021-01-11 LAB — ANAEROBIC AND AEROBIC CULTURE
MICRO NUMBER:: 12230562
MICRO NUMBER:: 12230563
SPECIMEN QUALITY:: ADEQUATE
SPECIMEN QUALITY:: ADEQUATE

## 2021-01-18 ENCOUNTER — Telehealth: Payer: Self-pay | Admitting: Dermatology

## 2021-01-18 DIAGNOSIS — K5730 Diverticulosis of large intestine without perforation or abscess without bleeding: Secondary | ICD-10-CM | POA: Diagnosis not present

## 2021-01-18 DIAGNOSIS — K6389 Other specified diseases of intestine: Secondary | ICD-10-CM | POA: Diagnosis not present

## 2021-01-18 DIAGNOSIS — Z86010 Personal history of colonic polyps: Secondary | ICD-10-CM | POA: Diagnosis not present

## 2021-01-18 DIAGNOSIS — D125 Benign neoplasm of sigmoid colon: Secondary | ICD-10-CM | POA: Diagnosis not present

## 2021-01-18 NOTE — Telephone Encounter (Signed)
Not reviewed by Dr Denna Haggard

## 2021-01-18 NOTE — Telephone Encounter (Signed)
Patient is calling for pathology results from last visit with Stuart Tafeen, MD 

## 2021-01-20 DIAGNOSIS — D125 Benign neoplasm of sigmoid colon: Secondary | ICD-10-CM | POA: Diagnosis not present

## 2021-01-23 ENCOUNTER — Ambulatory Visit: Payer: Medicare HMO

## 2021-01-24 ENCOUNTER — Telehealth: Payer: Self-pay | Admitting: Physician Assistant

## 2021-01-24 NOTE — Telephone Encounter (Signed)
Patient left VM requesting Bx results and also wants to know when she should stop using medication given for her mouth. She states in her VM that the issue has resolved but doesn't know if she should continue treatment or not.

## 2021-01-25 ENCOUNTER — Telehealth: Payer: Self-pay | Admitting: Physician Assistant

## 2021-01-25 NOTE — Telephone Encounter (Signed)
Called patient to give her the pathology results. She indicated that the biopsy site was healing well. Lesion was benign. 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 She will use on an prn basis.

## 2021-01-28 NOTE — Progress Notes (Signed)
Carelink Summary Report / Loop Recorder 

## 2021-01-31 ENCOUNTER — Other Ambulatory Visit: Payer: Self-pay | Admitting: Cardiology

## 2021-02-01 DIAGNOSIS — Z471 Aftercare following joint replacement surgery: Secondary | ICD-10-CM | POA: Diagnosis not present

## 2021-02-01 DIAGNOSIS — Z96642 Presence of left artificial hip joint: Secondary | ICD-10-CM | POA: Diagnosis not present

## 2021-02-02 ENCOUNTER — Other Ambulatory Visit: Payer: Self-pay

## 2021-02-02 ENCOUNTER — Ambulatory Visit
Admission: RE | Admit: 2021-02-02 | Discharge: 2021-02-02 | Disposition: A | Discharge status: Home / Self Care | Payer: Medicare HMO | Source: Ambulatory Visit | Attending: Obstetrics and Gynecology | Admitting: Obstetrics and Gynecology

## 2021-02-02 DIAGNOSIS — Z1231 Encounter for screening mammogram for malignant neoplasm of breast: Secondary | ICD-10-CM | POA: Diagnosis not present

## 2021-02-07 ENCOUNTER — Ambulatory Visit: Payer: Medicare HMO | Admitting: Dermatology

## 2021-02-07 ENCOUNTER — Encounter: Payer: Self-pay | Admitting: Dermatology

## 2021-02-07 ENCOUNTER — Other Ambulatory Visit: Payer: Self-pay

## 2021-02-07 DIAGNOSIS — S80862A Insect bite (nonvenomous), left lower leg, initial encounter: Secondary | ICD-10-CM | POA: Diagnosis not present

## 2021-02-07 DIAGNOSIS — Z1283 Encounter for screening for malignant neoplasm of skin: Secondary | ICD-10-CM | POA: Diagnosis not present

## 2021-02-07 DIAGNOSIS — R21 Rash and other nonspecific skin eruption: Secondary | ICD-10-CM

## 2021-02-07 DIAGNOSIS — S80861A Insect bite (nonvenomous), right lower leg, initial encounter: Secondary | ICD-10-CM

## 2021-02-07 DIAGNOSIS — Z85828 Personal history of other malignant neoplasm of skin: Secondary | ICD-10-CM | POA: Diagnosis not present

## 2021-02-07 DIAGNOSIS — D1801 Hemangioma of skin and subcutaneous tissue: Secondary | ICD-10-CM

## 2021-02-07 DIAGNOSIS — L720 Epidermal cyst: Secondary | ICD-10-CM | POA: Diagnosis not present

## 2021-02-07 DIAGNOSIS — Z86018 Personal history of other benign neoplasm: Secondary | ICD-10-CM

## 2021-02-10 ENCOUNTER — Telehealth: Payer: Self-pay

## 2021-02-10 NOTE — Telephone Encounter (Signed)
LINQ alert received. Device has reached RRT 02/09/21 Attempted to contact patient to advise.  No answer, LMTCB.  Marked "I" in [employer]. Future remotes canceled.

## 2021-02-13 NOTE — Telephone Encounter (Signed)
Patient notified and advised ILR @ RRT.   Return kit sent to address. Discussed options of explant and or leave device in. States she will think about it and call back if she would like explant. Appreciative of call.

## 2021-02-15 DIAGNOSIS — I480 Paroxysmal atrial fibrillation: Secondary | ICD-10-CM | POA: Diagnosis not present

## 2021-02-15 DIAGNOSIS — M19049 Primary osteoarthritis, unspecified hand: Secondary | ICD-10-CM | POA: Diagnosis not present

## 2021-02-18 ENCOUNTER — Encounter: Payer: Self-pay | Admitting: Dermatology

## 2021-02-18 NOTE — Progress Notes (Signed)
   Follow-Up Visit   Subjective  Tonya Turner is a 80 y.o. female who presents for the following: Annual Exam (Left sideburn- little bumps x years- no itch no bleed, history of bcc and atypia ).  General skin check, possible bumpy rash left sideburn. Location:  Duration:  Quality:  Associated Signs/Symptoms: Modifying Factors:  Severity:  Timing: Context:   Objective  Well appearing patient in no apparent distress; mood and affect are within normal limits. Chest (Upper Torso, Anterior) Full body skin check,patient has history of bcc and atypia. Lesions found today angiomas on the legs, keratoses on the legs and back. Patient stated she had insect bites on the lower legs now healing. Milia on the left side of the face and the back. I twice had patient point out the bumps she had noticed on her back cheek but was uncertain that I could see or feel the tiny bumps she had noticed. I'll certainly recheck if this gets worse.    All sun exposed areas plus back examined.   Assessment & Plan    Rash Chest (Upper Torso, Anterior)  Keep yearly skin checks no atypia or skin cancer found today .      I, Lavonna Monarch, MD, have reviewed all documentation for this visit.  The documentation on 02/18/21 for the exam, diagnosis, procedures, and orders are all accurate and complete.

## 2021-03-06 ENCOUNTER — Ambulatory Visit: Payer: Medicare HMO | Admitting: Internal Medicine

## 2021-03-06 ENCOUNTER — Other Ambulatory Visit: Payer: Self-pay

## 2021-03-06 VITALS — BP 108/68 | Ht 65.0 in | Wt 133.2 lb

## 2021-03-06 DIAGNOSIS — I471 Supraventricular tachycardia: Secondary | ICD-10-CM | POA: Diagnosis not present

## 2021-03-06 DIAGNOSIS — I480 Paroxysmal atrial fibrillation: Secondary | ICD-10-CM

## 2021-03-06 DIAGNOSIS — R55 Syncope and collapse: Secondary | ICD-10-CM

## 2021-03-06 NOTE — Patient Instructions (Addendum)
Medication Instructions:  Your physician recommends that you continue on your current medications as directed. Please refer to the Current Medication list given to you today. *If you need a refill on your cardiac medications before your next appointment, please call your pharmacy*  Lab Work: None. If you have labs (blood work) drawn today and your tests are completely normal, you will receive your results only by: [place] (if you have MyChart) OR A paper copy in the mail If you have any lab test that is abnormal or we need to change your treatment, we will call you to review the results.  Testing/Procedures: None.  Follow-Up: At [facility], you and your health needs are our priority.  As part of our continuing mission to provide you with exceptional heart care, we have created designated Provider Care Teams.  These Care Teams include your primary Cardiologist (physician) and Advanced Practice Providers (APPs -  Physician Assistants and Nurse Practitioners) who all work together to provide you with the care you need, when you need it.  Your physician wants you to follow-up in: 12 months with Dr. Marlou Porch or one of the following Advanced Practice Providers on your designated Care Team    You will receive a reminder letter in the mail two months in advance. If you don't receive a letter, please call our office to schedule the follow-up appointment.  We recommend signing up for the patient portal called "MyChart".  Sign up information is provided on this After Visit Summary.  MyChart is used to connect with patients for Virtual Visits (Telemedicine).  Patients are able to view lab/test results, encounter notes, upcoming appointments, etc.  Non-urgent messages can be sent to your provider as well.   To learn more about what you can do with MyChart, go to [email or web address].    Any Other Special Instructions Will Be Listed Below (If Applicable).

## 2021-03-06 NOTE — Progress Notes (Signed)
PCP: Lavone Orn, MD Primary Cardiologist: Dr Marlou Porch Primary EP: Dr Rayann Heman  Tonya Turner is a 80 y.o. female who presents today for routine electrophysiology followup.  Since last being seen in our clinic, the patient reports doing very well.  Today, she denies symptoms of palpitations, chest pain, shortness of breath,  lower extremity edema, presyncope, or syncope.  She has rare dizziness. The patient is otherwise without complaint today.   Past Medical History:  Diagnosis Date   Arthritis    "hands-mild; left hip" (09/28/2015)   Atypical mole 01/30/2017   Left Abdomen(mild)   Basal cell carcinoma of lower leg    "cut out on both shins"   BCC (basal cell carcinoma of skin) 01/12/2008   Right Lower Leg (curet and 5FU)   DDD (degenerative disc disease), lumbar    Degenerative disc disease, thoracic    Eczema    history of   Edema of extremities    usually lower extremities- bilateral   Family history of adverse reaction to anesthesia    "brother is allergic and gets sick"   Migraine    rare occular migraine- none recent (09/28/2015)   PAF (paroxysmal atrial fibrillation) ([place]) 01/30/2011   ekg from Dr Delene Ruffini office is reviewed and confirms afib   Pneumonia 1940s   Primary osteoarthritis of left hip    Unsteady gait    Varicose vein    hx. of-    Past Surgical History:  Procedure Laterality Date   BASAL CELL CARCINOMA EXCISION Bilateral    "shins"   COLONOSCOPY WITH PROPOFOL N/A 10/28/2012   Procedure: COLONOSCOPY WITH PROPOFOL;  Surgeon: Garlan Fair, MD;  Location: [facility];  Service: Endoscopy;  Laterality: N/A;   DILATION AND CURETTAGE OF UTERUS     JOINT REPLACEMENT     LOOP RECORDER INSERTION N/A 08/23/2017   Procedure: LOOP RECORDER INSERTION;  Surgeon: Thompson Grayer, MD;  Location: [place] [facility];  Service: Cardiovascular;  Laterality: N/A;   TOTAL HIP ARTHROPLASTY Left 09/27/2015   TOTAL HIP ARTHROPLASTY Left 09/27/2015   Procedure: TOTAL HIP  ARTHROPLASTY ANTERIOR APPROACH;  Surgeon: Melrose Nakayama, MD;  Location: [facility];  Service: Orthopedics;  Laterality: Left;   TUBAL LIGATION  1970s   VARICOSE VEIN SURGERY Left 1970s    ROS- all systems are reviewed and negatives except as per HPI above  Current Outpatient Medications  Medication Sig Dispense Refill   alclomethasone (ACLOVATE) 0.05 % ointment Apply topically 2 (two) times daily. 30 g 0   Biotin w/ Vitamins C & E (HAIR/SKIN/NAILS PO) Take 1 tablet by mouth 2 (two) times daily.     conjugated estrogens (PREMARIN) vaginal cream Place 1 Applicatorful vaginally 2 (two) times a week. Saturday & Tuesday.     diltiazem (CARDIZEM) 30 MG tablet Take 1 tablet (30 mg total) by mouth every 6 (six) hours as needed (for rapid heart beat). 60 tablet 5   flecainide (TAMBOCOR) 50 MG tablet Take 1 tablet (50 mg total) by mouth 2 (two) times daily. 180 tablet 0   Glucosamine-Chondroit-Vit C-Mn (GLUCOSAMINE CHONDR 1500 COMPLX) CAPS 1/2 tablet     loratadine (CLARITIN) 10 MG tablet 1 tablet     Multiple Vitamin (MULTIVITAMIN WITH MINERALS) TABS tablet Take 0.5 tablets by mouth daily at 3 pm.      triamcinolone cream (KENALOG) 0.1 % Apply 1 application topically daily as needed (for skin irritation/rash).      No current facility-administered medications for this visit.  Physical Exam: Vitals:   03/06/21 1426  BP: 108/68  SpO2: 94%  Weight: 133 lb 3.2 oz (60.4 kg)  Height: 5\' 5"  (1.651 m)    GEN- The patient is well appearing, alert and oriented x 3 today.   Head- normocephalic, atraumatic Eyes-  Sclera clear, conjunctiva pink Ears- hearing intact Oropharynx- clear Lungs- Clear to ausculation bilaterally, normal work of breathing Heart- Regular rate and rhythm, no murmurs, rubs or gallops, PMI not laterally displaced GI- soft, NT, ND, + BS Extremities- no clubbing, cyanosis, or edema  Wt Readings from Last 3 Encounters:  03/06/21 133 lb 3.2 oz (60.4 kg)  03/07/20 128 lb 9.6 oz  (58.3 kg)  02/09/19 140 lb (63.5 kg)    EKG tracing ordered today is personally reviewed and shows sinus rhythm  Assessment and Plan:  Paroxysmal atrial fibrillation Well controlled off AAD therapy.  She only had a single episode in 2012.  Linq AF burden is 0% chronically. Chads2vascsc reo is 3.  We will consider [place] if AF burden increases Her ILR is at RRT. We discussed risks and benefits to removal today.  At this time, she does not wish to have this removed.  If she changes her mind, she will contact my office.  2. SVT Well controlled with low dose flecainide  3. Vasovagal syncope Occurred years ago without recurrence No arrhythmias by ILR of concern  Risks, benefits and potential toxicities for medications prescribed and/or refilled reviewed with patient today.   Follow-up with Dr Marlou Porch in a year  Thompson Grayer MD, [facility] 03/06/2021 2:36 PM

## 2021-04-03 ENCOUNTER — Ambulatory Visit: Payer: Medicare HMO | Admitting: Cardiology

## 2021-05-01 ENCOUNTER — Other Ambulatory Visit: Payer: Self-pay | Admitting: *Deleted

## 2021-05-01 MED ORDER — TRIAMCINOLONE ACETONIDE 0.1 % EX CREA: 1.0000 "application " | TOPICAL_CREAM | Freq: Every day | CUTANEOUS | 0 refills | Status: AC | PRN

## 2021-06-18 DIAGNOSIS — M19049 Primary osteoarthritis, unspecified hand: Secondary | ICD-10-CM | POA: Diagnosis not present

## 2021-06-18 DIAGNOSIS — I480 Paroxysmal atrial fibrillation: Secondary | ICD-10-CM | POA: Diagnosis not present

## 2021-07-10 ENCOUNTER — Ambulatory Visit: Payer: Medicare HMO | Admitting: Podiatry

## 2021-07-10 ENCOUNTER — Other Ambulatory Visit: Payer: Self-pay

## 2021-07-10 DIAGNOSIS — M7742 Metatarsalgia, left foot: Secondary | ICD-10-CM | POA: Diagnosis not present

## 2021-07-10 DIAGNOSIS — Q828 Other specified congenital malformations of skin: Secondary | ICD-10-CM | POA: Diagnosis not present

## 2021-07-10 DIAGNOSIS — M2041 Other hammer toe(s) (acquired), right foot: Secondary | ICD-10-CM

## 2021-07-10 DIAGNOSIS — M21619 Bunion of unspecified foot: Secondary | ICD-10-CM

## 2021-07-10 DIAGNOSIS — M7741 Metatarsalgia, right foot: Secondary | ICD-10-CM

## 2021-07-16 NOTE — Progress Notes (Signed)
Subjective: 81 year old female presents the office today for concerns of callus on the right foot.  She says about the same as what it has been previously.  She has tried different pads and cushions which helps some and she is asking there is any other types of leaking tried.  No recent injuries and denies any increase in swelling.  No open sores.  Objective: AAO x3, NAD DP/PT pulses palpable bilaterally, CRT less than 3 seconds There is an area of thick, annular, hyperkeratotic tissue right fifth metatarsal base on the right side and upon debridement there is no underlying ulceration drainage or any signs of infection.  Tailor's bunion is present with prominence of metatarsal heads with atrophy of the fat pad.  Bunion present.  No pain with calf compression, swelling, warmth, erythema  Assessment: Porokeratosis right foot  Plan: -All treatment options discussed with the patient including all alternatives, risks, complications.  -Sharply debrided the lesion with any complications or bleeding.  Continue moisturizer and offloading. -Follow-up for new pads for her. -Patient encouraged to call the office with any questions, concerns, change in symptoms.   Trula Slade DPM

## 2021-08-01 ENCOUNTER — Other Ambulatory Visit: Payer: Self-pay | Admitting: Internal Medicine

## 2021-10-18 DIAGNOSIS — H2513 Age-related nuclear cataract, bilateral: Secondary | ICD-10-CM | POA: Diagnosis not present

## 2021-10-18 DIAGNOSIS — H52203 Unspecified astigmatism, bilateral: Secondary | ICD-10-CM | POA: Diagnosis not present

## 2021-12-21 DIAGNOSIS — H269 Unspecified cataract: Secondary | ICD-10-CM | POA: Diagnosis not present

## 2021-12-21 DIAGNOSIS — H2511 Age-related nuclear cataract, right eye: Secondary | ICD-10-CM | POA: Diagnosis not present

## 2022-01-01 ENCOUNTER — Other Ambulatory Visit: Payer: Self-pay | Admitting: Obstetrics and Gynecology

## 2022-01-01 DIAGNOSIS — Z1231 Encounter for screening mammogram for malignant neoplasm of breast: Secondary | ICD-10-CM

## 2022-01-02 DIAGNOSIS — Z0000 Encounter for general adult medical examination without abnormal findings: Secondary | ICD-10-CM | POA: Diagnosis not present

## 2022-01-02 DIAGNOSIS — R29898 Other symptoms and signs involving the musculoskeletal system: Secondary | ICD-10-CM | POA: Diagnosis not present

## 2022-01-02 DIAGNOSIS — Z1331 Encounter for screening for depression: Secondary | ICD-10-CM | POA: Diagnosis not present

## 2022-01-02 DIAGNOSIS — I471 Supraventricular tachycardia: Secondary | ICD-10-CM | POA: Diagnosis not present

## 2022-01-02 DIAGNOSIS — M85852 Other specified disorders of bone density and structure, left thigh: Secondary | ICD-10-CM | POA: Diagnosis not present

## 2022-01-02 DIAGNOSIS — N9489 Other specified conditions associated with female genital organs and menstrual cycle: Secondary | ICD-10-CM | POA: Diagnosis not present

## 2022-01-08 DIAGNOSIS — N952 Postmenopausal atrophic vaginitis: Secondary | ICD-10-CM | POA: Diagnosis not present

## 2022-01-08 DIAGNOSIS — Z01419 Encounter for gynecological examination (general) (routine) without abnormal findings: Secondary | ICD-10-CM | POA: Diagnosis not present

## 2022-01-08 DIAGNOSIS — N83209 Unspecified ovarian cyst, unspecified side: Secondary | ICD-10-CM | POA: Diagnosis not present

## 2022-01-10 DIAGNOSIS — Z96642 Presence of left artificial hip joint: Secondary | ICD-10-CM | POA: Diagnosis not present

## 2022-01-10 DIAGNOSIS — Z09 Encounter for follow-up examination after completed treatment for conditions other than malignant neoplasm: Secondary | ICD-10-CM | POA: Diagnosis not present

## 2022-01-11 DIAGNOSIS — H2512 Age-related nuclear cataract, left eye: Secondary | ICD-10-CM | POA: Diagnosis not present

## 2022-01-11 DIAGNOSIS — H269 Unspecified cataract: Secondary | ICD-10-CM | POA: Diagnosis not present

## 2022-01-22 ENCOUNTER — Ambulatory Visit: Payer: Medicare HMO | Admitting: Podiatry

## 2022-01-22 DIAGNOSIS — L84 Corns and callosities: Secondary | ICD-10-CM | POA: Diagnosis not present

## 2022-01-22 DIAGNOSIS — M2042 Other hammer toe(s) (acquired), left foot: Secondary | ICD-10-CM

## 2022-01-22 DIAGNOSIS — M2041 Other hammer toe(s) (acquired), right foot: Secondary | ICD-10-CM | POA: Diagnosis not present

## 2022-01-22 NOTE — Progress Notes (Unsigned)
Subjective: Chief Complaint  Patient presents with   Foot Problem    right foot has a place between great toe and 2nd toe-possible ulcer has developed/swelling on the side of foot   81 year old female with above concerns.  She says she has a corn on the lateral aspect of the big toe which is rubbing.  She also has drainage in her left fourth toe which causes discomfort.  It rubs the other toe.  No recent injuries.  No swelling redness or drainage.  No other concerns.  Objective: AAO x3, NAD DP/PT pulses palpable bilaterally, CRT less than 3 seconds At that appears present left fourth toe.  There is no ongoing ulceration or any callus formation.  Hyperkeratotic lesion present along the lateral aspect the hallux more strapping of the second toe.  No underlying ulceration drainage or signs of infection.  No pain with calf compression, swelling, warmth, erythema  Assessment: Preulcerative callus right hallux, hammertoe deformity  Plan: -All treatment options discussed with the patient including all alternatives, risks, complications.  -Sharply debrided the hyperkeratotic lesion without any complications or bleeding.  Dispensed offloading pads.  Continue with supportive shoe gear that is offloading.  Monitor for any signs or symptoms of infection. -Patient encouraged to call the office with any questions, concerns, change in symptoms.   Trula Slade DPM

## 2022-01-23 DIAGNOSIS — M6281 Muscle weakness (generalized): Secondary | ICD-10-CM | POA: Diagnosis not present

## 2022-02-07 ENCOUNTER — Ambulatory Visit
Admission: RE | Admit: 2022-02-07 | Discharge: 2022-02-07 | Disposition: A | Discharge status: Home / Self Care | Payer: Medicare HMO | Source: Ambulatory Visit | Attending: Obstetrics and Gynecology | Admitting: Obstetrics and Gynecology

## 2022-02-07 DIAGNOSIS — Z1231 Encounter for screening mammogram for malignant neoplasm of breast: Secondary | ICD-10-CM | POA: Diagnosis not present

## 2022-02-10 DIAGNOSIS — H2702 Aphakia, left eye: Secondary | ICD-10-CM | POA: Diagnosis not present

## 2022-02-10 DIAGNOSIS — H2701 Aphakia, right eye: Secondary | ICD-10-CM | POA: Diagnosis not present

## 2022-02-13 DIAGNOSIS — M6281 Muscle weakness (generalized): Secondary | ICD-10-CM | POA: Diagnosis not present

## 2022-02-20 DIAGNOSIS — M6281 Muscle weakness (generalized): Secondary | ICD-10-CM | POA: Diagnosis not present

## 2022-02-27 DIAGNOSIS — M6281 Muscle weakness (generalized): Secondary | ICD-10-CM | POA: Diagnosis not present

## 2022-03-01 ENCOUNTER — Other Ambulatory Visit: Payer: Self-pay | Admitting: Internal Medicine

## 2022-03-01 DIAGNOSIS — M85852 Other specified disorders of bone density and structure, left thigh: Secondary | ICD-10-CM

## 2022-03-02 ENCOUNTER — Encounter: Payer: Self-pay | Admitting: Cardiology

## 2022-03-02 ENCOUNTER — Ambulatory Visit: Payer: Medicare HMO | Attending: Cardiology | Admitting: Cardiology

## 2022-03-02 ENCOUNTER — Ambulatory Visit: Payer: Medicare HMO | Admitting: Cardiology

## 2022-03-02 VITALS — BP 110/60 | HR 87 | Ht 65.0 in | Wt 134.0 lb

## 2022-03-02 DIAGNOSIS — I480 Paroxysmal atrial fibrillation: Secondary | ICD-10-CM

## 2022-03-02 DIAGNOSIS — R55 Syncope and collapse: Secondary | ICD-10-CM

## 2022-03-02 DIAGNOSIS — I471 Supraventricular tachycardia, unspecified: Secondary | ICD-10-CM

## 2022-03-02 NOTE — Progress Notes (Signed)
Cardiology Office Note:    Date:  03/02/2022   ID:  Tonya Turner, DOB 09-Jan-1941, MRN [id number]  PCP:  No primary care provider on file.   [facility] Providers Cardiologist:  Candee Furbish, MD Electrophysiologist:  Thompson Grayer, MD     Referring MD: Lavone Orn, MD   History of Present Illness:    Tonya Turner is a 81 y.o. female here as a new patient for SVT, single episode of atrial fibrillation in 2012, implantable loop recorder, Dr. Rayann Heman, no episodes of atrial fibrillation.  I had seen her last over 3 years ago in 2019.  She was previously followed by Dr. Rayann Heman, last seen by him on 03/06/2021 where she was doing well and was maintaining sinus rhythm. Paroxysmal atrial fibrillation was well controlled off AAD therapy.  She only had a single episode in 2012.  Linq AF burden was 0% chronically. Chads2vasc score of 3.  Would consider [place] if AF burden increases. Her ILR was at RRT. After discussion of risks and benefits she did not wish to have this removed. Her SVT was well controlled with low dose flecainide. She was noted to have vasovagal syncope years prior without recurrence. No arrhythmias by ILR of concern.  Today: Was on anticoagulation for 7 years since her EKG in 2012 showed atrial fibrillation. She confirms that flecainide has been working well. We revisited discussion of removing her ILR.   Once, she had very sharp pains in her heart at night. Since that initial episode these intense, sharp pains recurred maybe once or twice. She believes her symptoms are likely due to acid reflux.  She had a prior episode of vasovagal syncope in the setting of exercising at [employer], and went to the ED.  She denies any palpitations, shortness of breath, or peripheral edema. No lightheadedness, headaches, orthopnea, or PND.   Past Medical History:  Diagnosis Date   Arthritis    "hands-mild; left hip" (09/28/2015)   Atypical mole 01/30/2017   Left Abdomen(mild)   Basal cell  carcinoma of lower leg    "cut out on both shins"   BCC (basal cell carcinoma of skin) 01/12/2008   Right Lower Leg (curet and 5FU)   DDD (degenerative disc disease), lumbar    Degenerative disc disease, thoracic    Eczema    history of   Edema of extremities    usually lower extremities- bilateral   Family history of adverse reaction to anesthesia    "brother is allergic and gets sick"   Migraine    rare occular migraine- none recent (09/28/2015)   PAF (paroxysmal atrial fibrillation) ([place]) 01/30/2011   ekg from Dr Delene Ruffini office is reviewed and confirms afib   Pneumonia 1940s   Primary osteoarthritis of left hip    Unsteady gait    Varicose vein    hx. of-     Past Surgical History:  Procedure Laterality Date   BASAL CELL CARCINOMA EXCISION Bilateral    "shins"   COLONOSCOPY WITH PROPOFOL N/A 10/28/2012   Procedure: COLONOSCOPY WITH PROPOFOL;  Surgeon: Garlan Fair, MD;  [facility];  Service: Endoscopy;  Laterality: N/A;   DILATION AND CURETTAGE OF UTERUS     JOINT REPLACEMENT     LOOP RECORDER INSERTION N/A 08/23/2017   Procedure: LOOP RECORDER INSERTION;  Surgeon: Thompson Grayer, MD;  Location: [place] [facility];  Service: Cardiovascular;  Laterality: N/A;   TOTAL HIP ARTHROPLASTY Left 09/27/2015   TOTAL HIP ARTHROPLASTY Left 09/27/2015  Procedure: TOTAL HIP ARTHROPLASTY ANTERIOR APPROACH;  Surgeon: Melrose Nakayama, MD;  Location: [place];  Service: Orthopedics;  Laterality: Left;   TUBAL LIGATION  1970s   VARICOSE VEIN SURGERY Left 1970s    Current Medications: Current Meds  Medication Sig   Biotin w/ Vitamins C & E (HAIR/SKIN/NAILS PO) Take 1 tablet by mouth 2 (two) times daily.   conjugated estrogens (PREMARIN) vaginal cream Place 1 Applicatorful vaginally 2 (two) times a week. Saturday & Tuesday.   diltiazem (CARDIZEM) 30 MG tablet Take 1 tablet (30 mg total) by mouth every 6 (six) hours as needed (for rapid heart beat).   flecainide (TAMBOCOR) 50 MG  tablet Take 1 tablet (50 mg total) by mouth 2 (two) times daily.   Glucosamine-Chondroit-Vit C-Mn (GLUCOSAMINE CHONDR 1500 COMPLX) CAPS 1/2 tablet   Multiple Vitamin (MULTIVITAMIN WITH MINERALS) TABS tablet Take 0.5 tablets by mouth daily at 3 pm.    triamcinolone cream (KENALOG) 0.1 % Apply 1 application topically daily as needed (for skin irritation/rash).    triamcinolone cream (KENALOG) 0.1 % Apply 1 application topically daily as needed.     Allergies:   Other, Adhesive [tape], Iodine, and Iodine solution [povidone iodine]   Social History   Socioeconomic History   Marital status: Married    Spouse name: Not on file   Number of children: Not on file   Years of education: Not on file   Highest education level: Not on file  Occupational History   Not on file  Tobacco Use   Smoking status: Former    Packs/day: 0.25    Years: 20.00    Total pack years: 5.00    Types: Cigarettes    Quit date: 10/01/1983    Years since quitting: 38.4   Smokeless tobacco: Never  Vaping Use   Vaping Use: Never used  Substance and Sexual Activity   Alcohol use: Yes    Alcohol/week: 14.0 standard drinks of alcohol    Types: 14 Glasses of wine per week   Drug use: No   Sexual activity: Not Currently  Other Topics Concern   Not on file  Social History Narrative   Not on file   Social Determinants of Health   Financial Resource Strain: Not on file  Food Insecurity: Not on file  Transportation Needs: Not on file  Physical Activity: Not on file  Stress: Not on file  Social Connections: Not on file     Family History: The patient's family history includes Breast cancer in her cousin; Diabetes type II in her brother; Hypertension in her brother. There is no history of Stroke.  ROS:   Please see the history of present illness.    (+) Sharp chest pains felt to be due to acid reflux All other systems reviewed and are negative.  EKGs/Labs/Other Studies Reviewed:    The following studies  were reviewed today:  Loop Recorder Insertion  08/23/2017: CONCLUSIONS:   1. Successful implantation of a Medtronic Reveal LINQ implantable loop recorder for palpitations and atrial fibrillation  2. No early apparent complications.   ETT  10/16/2016: Blood pressure demonstrated a normal response to exercise. There was no ST segment deviation noted during stress. No T wave inversion was noted during stress.   Normal ECG stress test.  Monitor 06/2016: PSVT - 207 bpm, symptomatic on 06/23/16. Note: in media folder there is EMS strip with PSVT as well and retrograde P seems to be present   Discussed in clinic. In fact, she had another  episode while sitting on exam table with "aura" she describes. I was able to auscultate but this broke by the time ECG was hooked up.  Has appt with Dr. Rayann Heman.   Echo 05/23/2016: Study Conclusions   - Left ventricle: The cavity size was normal. Systolic function was    normal. The estimated ejection fraction was in the range of 55%    to 60%. Wall motion was normal; there were no regional wall    motion abnormalities. Doppler parameters are consistent with    abnormal left ventricular relaxation (grade 1 diastolic    dysfunction). There was no evidence of elevated ventricular    filling pressure by Doppler parameters.  - Aortic valve: Trileaflet; mildly thickened, mildly calcified    leaflets. Transvalvular velocity was within the normal range.    There was no stenosis. There was no regurgitation.  - Aortic root: The aortic root was normal in size.  - Mitral valve: There was mild regurgitation.  - Left atrium: The atrium was normal in size.  - Right ventricle: Systolic function was normal.  - Right atrium: The atrium was normal in size.  - Tricuspid valve: There was trivial regurgitation.  - Pulmonary arteries: Systolic pressure was within the normal    range.  - Inferior vena cava: The vessel was normal in size.  - Pericardium, extracardiac: There  was no pericardial effusion.   EKG:  EKG is personally reviewed and interpreted. 03/02/2022: Sinus rhythm. Rate 87 bpm.  Recent Labs: No results found for requested labs within last 365 days.   Recent Lipid Panel No results found for: "CHOL", "TRIG", "HDL", "CHOLHDL", "VLDL", "LDLCALC", "LDLDIRECT"   Risk Assessment/Calculations:            Physical Exam:    VS:  BP 110/60 (BP Location: Left Arm, Patient Position: Sitting, Cuff Size: Normal)   Pulse 87   Ht '5\' 5"'$  (1.651 m)   Wt 134 lb (60.8 kg)   LMP  (LMP Unknown)   SpO2 95%   BMI 22.30 kg/m     Wt Readings from Last 3 Encounters:  03/02/22 134 lb (60.8 kg)  03/06/21 133 lb 3.2 oz (60.4 kg)  03/07/20 128 lb 9.6 oz (58.3 kg)     GEN: Well nourished, well developed in no acute distress HEENT: Normal NECK: No JVD; No carotid bruits LYMPHATICS: No lymphadenopathy CARDIAC: RRR, no murmurs, rubs, gallops RESPIRATORY:  Clear to auscultation without rales, wheezing or rhonchi  ABDOMEN: Soft, non-tender, non-distended MUSCULOSKELETAL:  No edema; No deformity  SKIN: Warm and dry NEUROLOGIC:  Alert and oriented x 3 PSYCHIATRIC:  Normal affect   ASSESSMENT:    1. PAF (paroxysmal atrial fibrillation) (HCC)   2. Vasovagal syncope   3. SVT (supraventricular tachycardia)    PLAN:    In order of problems listed above:  Last plan per Dr. Rayann Heman: EKG tracing ordered today is personally reviewed and shows sinus rhythm   Assessment and Plan:   Paroxysmal atrial fibrillation Well controlled off AAD therapy.  She only had a single episode in 2012.  Linq AF burden is 0% chronically. Chads2vascsc is 3.  We will consider [place] if AF burden increases. Her ILR is at RRT.  Does not wish to get removed at this time.  Discussed again.   2. SVT Well controlled with low dose flecainide.  EKG stable.  QRS duration 84 ms.  No syncope.  Rare burden.   3. Vasovagal syncope Occurred years ago without recurrence No arrhythmias by  ILR  of concern.  Stable.   Risks, benefits and potential toxicities for medications prescribed and/or refilled reviewed with patient today.   4.  Atypical chest pain Could certainly be GERD, musculoskeletal.  If symptoms worsen or become worrisome she will let us know.   Follow-up with Dr Marlou Porch in a year     Follow-up: 1 year.  Medication Adjustments/Labs and Tests Ordered: Current medicines are reviewed at length with the patient today.  Concerns regarding medicines are outlined above.   Orders Placed This Encounter  Procedures   EKG 12-Lead   No orders of the defined types were placed in this encounter.  Patient Instructions  Medication Instructions:  The current medical regimen is effective;  continue present plan and medications.  *If you need a refill on your cardiac medications before your next appointment, please call your pharmacy*  Follow-Up: At [facility], you and your health needs are our priority.  As part of our continuing mission to provide you with exceptional heart care, we have created designated Provider Care Teams.  These Care Teams include your primary Cardiologist (physician) and Advanced Practice Providers (APPs -  Physician Assistants and Nurse Practitioners) who all work together to provide you with the care you need, when you need it.  We recommend signing up for the patient portal called "MyChart".  Sign up information is provided on this After Visit Summary.  MyChart is used to connect with patients for Virtual Visits (Telemedicine).  Patients are able to view lab/test results, encounter notes, upcoming appointments, etc.  Non-urgent messages can be sent to your provider as well.   To learn more about what you can do with MyChart, go to [email or web address].    Your next appointment:   1 year(s)  The format for your next appointment:   In Person  Provider:   Dr Candee Furbish     Important Information About Sugar         I,Mathew  Stumpf,acting as a scribe for Candee Furbish, MD.,have documented all relevant documentation on the behalf of Candee Furbish, MD,as directed by  Candee Furbish, MD while in the presence of Candee Furbish, MD.  I, Candee Furbish, MD, have reviewed all documentation for this visit. The documentation on 03/02/22 for the exam, diagnosis, procedures, and orders are all accurate and complete.   Signed, Candee Furbish, MD  03/02/2022 11:56 AM    [place]

## 2022-03-02 NOTE — Patient Instructions (Signed)
Medication Instructions:  The current medical regimen is effective;  continue present plan and medications.  *If you need a refill on your cardiac medications before your next appointment, please call your pharmacy*  Follow-Up: At [facility], you and your health needs are our priority.  As part of our continuing mission to provide you with exceptional heart care, we have created designated Provider Care Teams.  These Care Teams include your primary Cardiologist (physician) and Advanced Practice Providers (APPs -  Physician Assistants and Nurse Practitioners) who all work together to provide you with the care you need, when you need it.  We recommend signing up for the patient portal called "MyChart".  Sign up information is provided on this After Visit Summary.  MyChart is used to connect with patients for Virtual Visits (Telemedicine).  Patients are able to view lab/test results, encounter notes, upcoming appointments, etc.  Non-urgent messages can be sent to your provider as well.   To learn more about what you can do with MyChart, go to [email or web address].    Your next appointment:   1 year(s)  The format for your next appointment:   In Person  Provider:   Dr Mark Skains  Important Information About Sugar       

## 2022-03-05 ENCOUNTER — Ambulatory Visit: Payer: Medicare HMO | Admitting: Podiatry

## 2022-03-05 DIAGNOSIS — B070 Plantar wart: Secondary | ICD-10-CM

## 2022-03-05 NOTE — Patient Instructions (Signed)
Take dressing off in 8 hours and wash the foot with soap and water. If it is hurting or becomes uncomfortable before the 8 hours, go ahead and remove the bandage and wash the area.  If it blisters, apply antibiotic ointment and a band-aid.  Monitor for any signs/symptoms of infection. Call the office immediately if any occur or go directly to the emergency room. Call with any questions/concerns.   

## 2022-03-06 DIAGNOSIS — M6281 Muscle weakness (generalized): Secondary | ICD-10-CM | POA: Diagnosis not present

## 2022-03-08 NOTE — Progress Notes (Signed)
Subjective: Chief Complaint  Patient presents with   Callouses    Right foot callus on haallux , the place is sore,    81 year old female presents the above concerns.  She states the area on her feet toe is still sore.  Have to wear shoes because this.  No swelling redness or any drainage.  Objective: AAO x3, NAD DP/PT pulses palpable bilaterally, CRT less than 3 seconds The lateral aspect of the hallux on the right side is 1 small pinpoint hyperkeratotic lesion.  There is a small dried blood.  This could be consistent with a wart.  No edema, erythema or signs of infection.   No pain with calf compression, swelling, warmth, erythema  Assessment: Preulcerative callus right hallux  Plan: -All treatment options discussed with the patient including all alternatives, risks, complications.  -Sharply debrided the hyperkeratotic lesion without any complications or bleeding.  Skin was cleaned with alcohol and Cantharone Plus was applied followed by an occlusive bandage.  Postprocedure instructions discussed.  Monitor for any signs or symptoms of infection.  Continue offloading. -Patient encouraged to call the office with any questions, concerns, change in symptoms.   Trula Slade DPM

## 2022-04-11 DIAGNOSIS — Z79899 Other long term (current) drug therapy: Secondary | ICD-10-CM | POA: Diagnosis not present

## 2022-04-11 DIAGNOSIS — I781 Nevus, non-neoplastic: Secondary | ICD-10-CM | POA: Diagnosis not present

## 2022-05-11 ENCOUNTER — Ambulatory Visit: Payer: Medicare HMO | Admitting: Podiatry

## 2022-05-11 DIAGNOSIS — L84 Corns and callosities: Secondary | ICD-10-CM

## 2022-05-11 DIAGNOSIS — M2042 Other hammer toe(s) (acquired), left foot: Secondary | ICD-10-CM

## 2022-05-11 DIAGNOSIS — M2041 Other hammer toe(s) (acquired), right foot: Secondary | ICD-10-CM

## 2022-05-13 NOTE — Progress Notes (Signed)
Subjective: Chief Complaint  Patient presents with   Nail Problem    Routine foot care,      81 year old female presents the above concerns.  She has a couple of concerns and questions about some calluses on her toes also different offloading devices.  She likes the toe separator between the toes on the left foot however it does not stay in place.  No open lesions.  No drainage.    Objective: AAO x3, NAD DP/PT pulses palpable bilaterally, CRT less than 3 seconds The lateral aspect of the hallux on the right side is 1 small pinpoint hyperkeratotic lesion.  There is no underlying ulceration drainage or signs of infection.  Multiple digital deformities are noted.  No pain with calf compression, swelling, warmth, erythema  Assessment: Preulcerative callus right hallux; digital formerly  Plan: -All treatment options discussed with the patient including all alternatives, risks, complications.  -Sharply debrided the hyperkeratotic lesion without any complications or bleeding as a courtesy as this was quite minimal today. -I dispensed other offloading devices to help support the toes.  I used a small toe spacer and made this into a removable so that we would stay around the toe better.  Utilize Coflex to do this. -Continue supportive, offloading shoes  Trula Slade DPM

## 2022-07-19 ENCOUNTER — Telehealth: Payer: Self-pay | Admitting: Cardiology

## 2022-07-19 ENCOUNTER — Other Ambulatory Visit: Payer: Self-pay

## 2022-07-19 MED ORDER — FLECAINIDE ACETATE 50 MG PO TABS: 50.0000 mg | ORAL_TABLET | Freq: Two times a day (BID) | ORAL | 2 refills | Status: DC

## 2022-07-19 NOTE — Telephone Encounter (Signed)
*  STAT* If patient is at the pharmacy, call can be transferred to refill team.   1. Which medications need to be refilled? (please list name of each medication and dose if known)  flecainide (TAMBOCOR) 50 MG tablet  2. Which pharmacy/location (including street and city if local pharmacy) is medication to be sent to? [facility]   3. Do they need a 30 day or 90 day supply?   90 day supply

## 2022-07-19 NOTE — Telephone Encounter (Signed)
Pt's medication was sent to pt's pharmacy as requested. Confirmation received.  °

## 2022-07-24 DIAGNOSIS — R69 Illness, unspecified: Secondary | ICD-10-CM | POA: Diagnosis not present

## 2022-07-25 DIAGNOSIS — M25562 Pain in left knee: Secondary | ICD-10-CM | POA: Diagnosis not present

## 2022-07-26 ENCOUNTER — Ambulatory Visit: Payer: Medicare HMO | Admitting: Podiatry

## 2022-07-26 DIAGNOSIS — M2041 Other hammer toe(s) (acquired), right foot: Secondary | ICD-10-CM | POA: Diagnosis not present

## 2022-07-26 DIAGNOSIS — M216X2 Other acquired deformities of left foot: Secondary | ICD-10-CM

## 2022-07-26 DIAGNOSIS — L84 Corns and callosities: Secondary | ICD-10-CM | POA: Diagnosis not present

## 2022-07-26 DIAGNOSIS — M2042 Other hammer toe(s) (acquired), left foot: Secondary | ICD-10-CM | POA: Diagnosis not present

## 2022-07-26 DIAGNOSIS — M79672 Pain in left foot: Secondary | ICD-10-CM | POA: Diagnosis not present

## 2022-07-26 DIAGNOSIS — M216X1 Other acquired deformities of right foot: Secondary | ICD-10-CM

## 2022-07-26 DIAGNOSIS — G8929 Other chronic pain: Secondary | ICD-10-CM

## 2022-07-26 DIAGNOSIS — M79671 Pain in right foot: Secondary | ICD-10-CM | POA: Diagnosis not present

## 2022-07-26 NOTE — Progress Notes (Signed)
Subjective: Chief Complaint  Patient presents with   Callouses    Corn in between right hallux and 2nd toe. Causing pain. Patient is concerned that using the metatarsal gel pad and the toe spacers is causing the corn to grow when she walks for long period of time.    Foot Orthotics    Patient would like to get casted for orthotics. Has been casted in the past.      82 year old female presents the above concerns.  She states that the corn between the big toe and second toe which started after wearing a different pair shoes has come back causing pain.  She continues with offloading and various devices to help protect the bunion, digital fomites.  She is interested in new orthotics as hers seem to be worn down.  No recent injuries or changes otherwise.    Objective: AAO x3, NAD DP/PT pulses palpable bilaterally, CRT less than 3 seconds The lateral aspect of the hallux on the right side is 1 small pinpoint hyperkeratotic lesion.  There is no underlying ulceration drainage or signs of infection.  Multiple digital deformities are noted.  Bunions also present.  Prominence of metatarsals plantarly with atrophy of the fat pad. No pain with calf compression, swelling, warmth, erythema  Assessment: Preulcerative callus right hallux; digital formerly  Plan: -All treatment options discussed with the patient including all alternatives, risks, complications.  -Sharply debrided the hyperkeratotic lesion without any complications or bleeding as a courtesy as this was quite minimal today.  Consider medication to this area if needed if there is no improvement.  Continue offloading. -I do think that she will benefit from new custom orthotics.  This is proven to be beneficial for her previously.  Given her digital deformity, prominent metatarsal heads as well as a preulcerative callus this will be helpful to prevent ongoing issues and hopefully avoid surgical intervention.  Will try for  predetermination.   Trula Slade DPM

## 2022-08-02 ENCOUNTER — Telehealth: Payer: Self-pay | Admitting: Podiatry

## 2022-08-02 NOTE — Telephone Encounter (Signed)
Voicemail left from [facility] that they denied the request for orthotics, not medically necessary .  They will fax over determination letter.

## 2022-08-07 ENCOUNTER — Telehealth: Payer: Self-pay | Admitting: Podiatry

## 2022-08-07 NOTE — Telephone Encounter (Signed)
Called patient to let her know that her insurance denied her getting orthotics.  Patient would like to know if we could appeal the denial?  Insurance denied saying they were not medically necessary,  could Dr Jacqualyn Posey write a letter or something with notes that support the reasons for her to have the orthotics?

## 2022-08-27 ENCOUNTER — Ambulatory Visit
Admission: RE | Admit: 2022-08-27 | Discharge: 2022-08-27 | Disposition: A | Discharge status: Home / Self Care | Payer: Medicare HMO | Source: Ambulatory Visit | Attending: Internal Medicine | Admitting: Internal Medicine

## 2022-08-27 DIAGNOSIS — M85852 Other specified disorders of bone density and structure, left thigh: Secondary | ICD-10-CM

## 2022-08-27 DIAGNOSIS — M85851 Other specified disorders of bone density and structure, right thigh: Secondary | ICD-10-CM | POA: Diagnosis not present

## 2022-08-27 DIAGNOSIS — Z780 Asymptomatic menopausal state: Secondary | ICD-10-CM | POA: Diagnosis not present

## 2022-09-13 NOTE — Telephone Encounter (Signed)
Medicare denied appeal - appeal number [id number] - documenation sent to scan center

## 2022-11-01 DIAGNOSIS — L820 Inflamed seborrheic keratosis: Secondary | ICD-10-CM | POA: Diagnosis not present

## 2022-11-01 DIAGNOSIS — D225 Melanocytic nevi of trunk: Secondary | ICD-10-CM | POA: Diagnosis not present

## 2022-11-01 DIAGNOSIS — D1801 Hemangioma of skin and subcutaneous tissue: Secondary | ICD-10-CM | POA: Diagnosis not present

## 2022-11-01 DIAGNOSIS — D2271 Melanocytic nevi of right lower limb, including hip: Secondary | ICD-10-CM | POA: Diagnosis not present

## 2022-11-01 DIAGNOSIS — L570 Actinic keratosis: Secondary | ICD-10-CM | POA: Diagnosis not present

## 2022-11-01 DIAGNOSIS — L821 Other seborrheic keratosis: Secondary | ICD-10-CM | POA: Diagnosis not present

## 2022-11-01 DIAGNOSIS — L718 Other rosacea: Secondary | ICD-10-CM | POA: Diagnosis not present

## 2022-12-28 ENCOUNTER — Ambulatory Visit: Payer: Medicare HMO | Admitting: Podiatry

## 2022-12-28 DIAGNOSIS — L989 Disorder of the skin and subcutaneous tissue, unspecified: Secondary | ICD-10-CM

## 2022-12-30 NOTE — Progress Notes (Signed)
Subjective: Chief Complaint  Patient presents with   Callouses    Corn in between right hallux and second toe. Callus to ball of right foot lateral aspect of foot.    Toe Pain    Left toe pain. Patient believes it could be arthritis. Wants to know what can she do to slow the process.      82 year old female presents the above concerns.  She states that she wore her Oofos again and it cased another skin lesion between the first and second toes on the right foot that sharply have trimmed, removed.  No open lesions.  No injuries.  Also had questions about her orthotics.  Discussed possibly getting new ones last appointment.  Her current inserts are already been refurbished.   Objective: AAO x3, NAD DP/PT pulses palpable bilaterally, CRT less than 3 seconds The lateral aspect of the hallux on the right side is 1 small pinpoint hyperkeratotic lesion.  No underlying ulceration drainage or any signs of infection.  No open lesions.  Digital fomites are noted.  Prominent metatarsal heads.  No pain with calf compression, swelling, warmth, erythema  Assessment: Preulcerative callus right hallux; digital formerly  Plan: -All treatment options discussed with the patient including all alternatives, risks, complications.  -Sharply debrided the hyperkeratotic lesion without any complications or bleeding today.  Continue offloading.  Will this will continue to resolve on its own once the pressure is off. Bethann Berkshire, our pedorthotist, evaluate the patient for inserts today. Will continue current ones for now.   Return if symptoms worsen or fail to improve.  Vivi Barrack DPM

## 2022-12-31 DIAGNOSIS — Z136 Encounter for screening for cardiovascular disorders: Secondary | ICD-10-CM | POA: Diagnosis not present

## 2022-12-31 DIAGNOSIS — Z0000 Encounter for general adult medical examination without abnormal findings: Secondary | ICD-10-CM | POA: Diagnosis not present

## 2022-12-31 DIAGNOSIS — Z79899 Other long term (current) drug therapy: Secondary | ICD-10-CM | POA: Diagnosis not present

## 2022-12-31 DIAGNOSIS — Z1331 Encounter for screening for depression: Secondary | ICD-10-CM | POA: Diagnosis not present

## 2022-12-31 DIAGNOSIS — M8588 Other specified disorders of bone density and structure, other site: Secondary | ICD-10-CM | POA: Diagnosis not present

## 2022-12-31 DIAGNOSIS — N952 Postmenopausal atrophic vaginitis: Secondary | ICD-10-CM | POA: Diagnosis not present

## 2022-12-31 DIAGNOSIS — M85852 Other specified disorders of bone density and structure, left thigh: Secondary | ICD-10-CM | POA: Diagnosis not present

## 2022-12-31 DIAGNOSIS — Z8679 Personal history of other diseases of the circulatory system: Secondary | ICD-10-CM | POA: Diagnosis not present

## 2022-12-31 DIAGNOSIS — Z23 Encounter for immunization: Secondary | ICD-10-CM | POA: Diagnosis not present

## 2022-12-31 DIAGNOSIS — I471 Supraventricular tachycardia, unspecified: Secondary | ICD-10-CM | POA: Diagnosis not present

## 2022-12-31 DIAGNOSIS — Z9181 History of falling: Secondary | ICD-10-CM | POA: Diagnosis not present

## 2023-01-10 ENCOUNTER — Other Ambulatory Visit: Payer: Self-pay | Admitting: Internal Medicine

## 2023-01-10 DIAGNOSIS — Z1231 Encounter for screening mammogram for malignant neoplasm of breast: Secondary | ICD-10-CM

## 2023-01-14 DIAGNOSIS — Z01419 Encounter for gynecological examination (general) (routine) without abnormal findings: Secondary | ICD-10-CM | POA: Diagnosis not present

## 2023-01-14 DIAGNOSIS — N83209 Unspecified ovarian cyst, unspecified side: Secondary | ICD-10-CM | POA: Diagnosis not present

## 2023-01-29 DIAGNOSIS — L03116 Cellulitis of left lower limb: Secondary | ICD-10-CM | POA: Diagnosis not present

## 2023-02-01 DIAGNOSIS — L0390 Cellulitis, unspecified: Secondary | ICD-10-CM | POA: Diagnosis not present

## 2023-02-01 DIAGNOSIS — T63424D Toxic effect of venom of ants, undetermined, subsequent encounter: Secondary | ICD-10-CM | POA: Diagnosis not present

## 2023-02-01 DIAGNOSIS — T7840XD Allergy, unspecified, subsequent encounter: Secondary | ICD-10-CM | POA: Diagnosis not present

## 2023-02-12 DIAGNOSIS — Z961 Presence of intraocular lens: Secondary | ICD-10-CM | POA: Diagnosis not present

## 2023-02-12 DIAGNOSIS — H524 Presbyopia: Secondary | ICD-10-CM | POA: Diagnosis not present

## 2023-02-14 ENCOUNTER — Ambulatory Visit
Admission: RE | Admit: 2023-02-14 | Discharge: 2023-02-14 | Disposition: A | Discharge status: Home / Self Care | Payer: Medicare HMO | Source: Ambulatory Visit | Attending: Internal Medicine | Admitting: Internal Medicine

## 2023-02-14 DIAGNOSIS — Z1231 Encounter for screening mammogram for malignant neoplasm of breast: Secondary | ICD-10-CM

## 2023-02-19 ENCOUNTER — Other Ambulatory Visit: Payer: Self-pay

## 2023-02-19 ENCOUNTER — Telehealth: Payer: Self-pay | Admitting: Cardiology

## 2023-02-19 MED ORDER — FLECAINIDE ACETATE 50 MG PO TABS: 50.0000 mg | ORAL_TABLET | Freq: Two times a day (BID) | ORAL | 0 refills | Status: DC

## 2023-02-19 NOTE — Telephone Encounter (Signed)
*  STAT* If patient is at the pharmacy, call can be transferred to refill team.   1. Which medications need to be refilled? (please list name of each medication and dose if known)  flecainide (TAMBOCOR) 50 MG tablet    2. Would you like to learn more about the convenience, safety, & potential cost savings by using the [facility]?     3. Are you open to using the [facility] (Type [facility].   ).   4. Which pharmacy/location (including street and city if local pharmacy) is medication to be sent to? [facility] - [place], [place] - [address]    5. Do they need a 30 day or 90 day supply? 90 day

## 2023-02-20 DIAGNOSIS — M25552 Pain in left hip: Secondary | ICD-10-CM | POA: Diagnosis not present

## 2023-02-20 MED ORDER — FLECAINIDE ACETATE 50 MG PO TABS: 50.0000 mg | ORAL_TABLET | Freq: Two times a day (BID) | ORAL | 0 refills | Status: DC

## 2023-02-20 NOTE — Telephone Encounter (Signed)
Pt's medication was sent to pt's pharmacy as requested. Confirmation received.  °

## 2023-03-12 ENCOUNTER — Ambulatory Visit: Payer: Medicare HMO | Attending: Cardiology | Admitting: Cardiology

## 2023-03-12 ENCOUNTER — Encounter: Payer: Self-pay | Admitting: Cardiology

## 2023-03-12 VITALS — BP 110/68 | HR 63 | Ht 65.0 in | Wt 134.0 lb

## 2023-03-12 DIAGNOSIS — E782 Mixed hyperlipidemia: Secondary | ICD-10-CM

## 2023-03-12 DIAGNOSIS — I480 Paroxysmal atrial fibrillation: Secondary | ICD-10-CM

## 2023-03-12 DIAGNOSIS — I471 Supraventricular tachycardia, unspecified: Secondary | ICD-10-CM

## 2023-03-12 DIAGNOSIS — Z0000 Encounter for general adult medical examination without abnormal findings: Secondary | ICD-10-CM | POA: Diagnosis not present

## 2023-03-12 DIAGNOSIS — Z79899 Other long term (current) drug therapy: Secondary | ICD-10-CM

## 2023-03-12 MED ORDER — FLECAINIDE ACETATE 50 MG PO TABS: 50.0000 mg | ORAL_TABLET | Freq: Two times a day (BID) | ORAL | 3 refills | Status: DC

## 2023-03-12 NOTE — Progress Notes (Signed)
Cardiology Office Note:  .   Date:  03/12/2023  ID:  Tonya Turner, DOB 11-18-1940, MRN [id number] PCP: Thana Ates, MD  [facility] Providers Cardiologist:  Donato Schultz, MD Electrophysiologist:  Hillis Range, MD (Inactive)    History of Present Illness: .   Tonya Turner is a 82 y.o. female Discussed the use of AI scribe   History of Present Illness   The patient, an 82 year old with a history of atrial fibrillation and vasovagal syncope, presents for a follow-up visit. The atrial fibrillation was well controlled since an isolated episode in 2012, and the vasovagal syncope has not recurred for many years. The patient has been on a low dose of Flecainide (50mg  twice daily) and Cardizem (30mg  as needed), which has effectively managed her supraventricular tachycardia (SVT).  The patient also reports experiencing atypical chest pain, which she describes as possibly musculoskeletal or related to gastroesophageal reflux disease (GERD). Despite these symptoms, the patient has not had any incidents of SVT since starting Flecainide.  The patient had considered an ablation procedure with Dr. Johney Frame for her SVT but decided against it due to the invasive nature of the procedure and the lack of guaranteed success. Instead, she opted to try medication management, which has proven successful.  The patient denies any side effects from the Flecainide and reports feeling in good physical shape, maintaining an active lifestyle with regular exercise. She has not noticed any changes in her health that would indicate a worsening of her condition.  The patient's EKG results were normal, and her blood work from the previous year showed stable levels. However, the patient has a history of high HDL cholesterol, which has resulted in a high total cholesterol level. Despite this, her LDL cholesterol levels have remained within normal limits. The patient's blood glucose levels have also been stable, but she has  a family history of diabetes.          ROS: No CP, NO SOB  Studies Reviewed: Marland Kitchen   EKG Interpretation Date/Time:  Tuesday March 12 2023 14:33:48 EDT Ventricular Rate:  72 PR Interval:  150 QRS Duration:  78 QT Interval:  386 QTC Calculation: 422 R Axis:   78  Text Interpretation: Normal sinus rhythm Normal ECG When compared with ECG of 22-May-2016 18:37, PREVIOUS ECG IS PRESENT Confirmed by Donato Schultz ([id number]) on 03/12/2023 2:37:33 PM    Results LABS Creatinine: 0.69 (03/2022) Glucose: 92 (03/2022) Hemoglobin: 12.9 (03/2022) LDL: 90 (03/2022)  DIAGNOSTIC EKG: Normal (03/12/2023)  Risk Assessment/Calculations:            Physical Exam:   VS:  BP 110/68   Pulse 63   Ht 5\' 5"  (1.651 m)   Wt 134 lb (60.8 kg)   LMP  (LMP Unknown)   SpO2 98%   BMI 22.30 kg/m    Wt Readings from Last 3 Encounters:  03/12/23 134 lb (60.8 kg)  03/02/22 134 lb (60.8 kg)  03/06/21 133 lb 3.2 oz (60.4 kg)    GEN: Well nourished, well developed in no acute distress NECK: No JVD; No carotid bruits CARDIAC: RRR, no murmurs, no rubs, no gallops RESPIRATORY:  Clear to auscultation without rales, wheezing or rhonchi  ABDOMEN: Soft, non-tender, non-distended EXTREMITIES:  No edema; No deformity   ASSESSMENT AND PLAN: .    Assessment and Plan    Atrial Fibrillation and Supraventricular Tachycardia Well controlled on Flecainide 50mg  twice daily. No recent episodes. EKG today was normal. -Continue Flecainide 50mg  twice daily. -  Cardizem 30mg  as needed.  Atypical Chest Pain Likely musculoskeletal or GERD related. No concerning features. -No changes to current management.  General Health Maintenance -Order labs including Protime, CBC, Basic Metabolic Panel, Lipid Panel, and A1c. -Plan for 1 year follow-up.             Signed, Donato Schultz, MD

## 2023-03-12 NOTE — Patient Instructions (Signed)
Medication Instructions:  The current medical regimen is effective;  continue present plan and medications.  *If you need a refill on your cardiac medications before your next appointment, please call your pharmacy*   Lab Work: Please have blood work today (CBC, BMP, Lipid and A1c)  If you have labs (blood work) drawn today and your tests are completely normal, you will receive your results only by: MyChart Message (if you have MyChart) OR A paper copy in the mail If you have any lab test that is abnormal or we need to change your treatment, we will call you to review the results.   Follow-Up: At [facility], you and your health needs are our priority.  As part of our continuing mission to provide you with exceptional heart care, we have created designated Provider Care Teams.  These Care Teams include your primary Cardiologist (physician) and Advanced Practice Providers (APPs -  Physician Assistants and Nurse Practitioners) who all work together to provide you with the care you need, when you need it.  We recommend signing up for the patient portal called "MyChart".  Sign up information is provided on this After Visit Summary.  MyChart is used to connect with patients for Virtual Visits (Telemedicine).  Patients are able to view lab/test results, encounter notes, upcoming appointments, etc.  Non-urgent messages can be sent to your provider as well.   To learn more about what you can do with MyChart, go to [email or web address].    Your next appointment:   1 year(s)  Provider:   Donato Schultz, MD

## 2023-03-13 LAB — HEMOGLOBIN A1C
Est. average glucose Bld gHb Est-mCnc: 117 mg/dL
Hgb A1c MFr Bld: 5.7 % — ABNORMAL HIGH (ref 4.8–5.6)

## 2023-03-13 LAB — BASIC METABOLIC PANEL
BUN/Creatinine Ratio: 14 (ref 12–28)
BUN: 10 mg/dL (ref 8–27)
CO2: 23 mmol/L (ref 20–29)
Calcium: 9.4 mg/dL (ref 8.7–10.3)
Chloride: 101 mmol/L (ref 96–106)
Creatinine, Ser: 0.71 mg/dL (ref 0.57–1.00)
Glucose: 97 mg/dL (ref 70–99)
Potassium: 4.4 mmol/L (ref 3.5–5.2)
Sodium: 140 mmol/L (ref 134–144)
eGFR: 84 mL/min/{1.73_m2} (ref >59)

## 2023-03-13 LAB — LIPID PANEL
Chol/HDL Ratio: 2.2 {ratio} (ref 0.0–4.4)
Cholesterol, Total: 244 mg/dL — ABNORMAL HIGH (ref 100–199)
HDL: 110 mg/dL (ref >39)
LDL Chol Calc (NIH): 126 mg/dL — ABNORMAL HIGH (ref 0–99)
Triglycerides: 51 mg/dL (ref 0–149)
VLDL Cholesterol Cal: 8 mg/dL (ref 5–40)

## 2023-03-13 LAB — CBC
Hematocrit: 39.3 % (ref 34.0–46.6)
Hemoglobin: 13.2 g/dL (ref 11.1–15.9)
MCH: 32.1 pg (ref 26.6–33.0)
MCHC: 33.6 g/dL (ref 31.5–35.7)
MCV: 96 fL (ref 79–97)
Platelets: 344 10*3/uL (ref 150–450)
RBC: 4.11 x10E6/uL (ref 3.77–5.28)
RDW: 12.5 % (ref 11.7–15.4)
WBC: 6.3 10*3/uL (ref 3.4–10.8)

## 2023-03-14 ENCOUNTER — Encounter: Payer: Self-pay | Admitting: *Deleted

## 2023-03-25 ENCOUNTER — Ambulatory Visit: Payer: Medicare HMO | Admitting: Podiatry

## 2023-03-25 DIAGNOSIS — I872 Venous insufficiency (chronic) (peripheral): Secondary | ICD-10-CM

## 2023-03-25 DIAGNOSIS — M216X2 Other acquired deformities of left foot: Secondary | ICD-10-CM

## 2023-03-25 DIAGNOSIS — L989 Disorder of the skin and subcutaneous tissue, unspecified: Secondary | ICD-10-CM | POA: Diagnosis not present

## 2023-03-25 DIAGNOSIS — M2041 Other hammer toe(s) (acquired), right foot: Secondary | ICD-10-CM | POA: Diagnosis not present

## 2023-03-25 DIAGNOSIS — M216X1 Other acquired deformities of right foot: Secondary | ICD-10-CM
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
## 2023-04-04 DIAGNOSIS — L820 Inflamed seborrheic keratosis: Secondary | ICD-10-CM | POA: Diagnosis not present

## 2023-04-04 DIAGNOSIS — L570 Actinic keratosis: Secondary | ICD-10-CM | POA: Diagnosis not present

## 2023-04-04 DIAGNOSIS — L821 Other seborrheic keratosis: Secondary | ICD-10-CM | POA: Diagnosis not present

## 2023-05-09 DIAGNOSIS — Z0100 Encounter for examination of eyes and vision without abnormal findings: Secondary | ICD-10-CM | POA: Diagnosis not present

## 2023-07-01 DIAGNOSIS — M2550 Pain in unspecified joint: Secondary | ICD-10-CM | POA: Diagnosis not present

## 2023-07-01 DIAGNOSIS — R058 Other specified cough: Secondary | ICD-10-CM | POA: Diagnosis not present

## 2023-07-01 DIAGNOSIS — M7918 Myalgia, other site: Secondary | ICD-10-CM | POA: Diagnosis not present

## 2023-07-08 ENCOUNTER — Encounter: Payer: Self-pay | Admitting: Podiatry

## 2023-07-08 ENCOUNTER — Ambulatory Visit (INDEPENDENT_AMBULATORY_CARE_PROVIDER_SITE_OTHER): Payer: Medicare HMO | Admitting: Podiatry

## 2023-07-08 DIAGNOSIS — M2041 Other hammer toe(s) (acquired), right foot: Secondary | ICD-10-CM

## 2023-07-08 DIAGNOSIS — M216X1 Other acquired deformities of right foot: Secondary | ICD-10-CM | POA: Diagnosis not present

## 2023-07-08 NOTE — Progress Notes (Signed)
Subjective: Chief Complaint  Patient presents with   Foot Pain    RM#13 Right foot corn also has left foot concern.    83 year old female presents the above concerns.  She said that she has developed a corn on the medial aspect of the right second toe that she points to decide to have checked.  Stressed keep toe spacers between her toes.  She does not report any recent injuries.  Objective: AAO x3, NAD DP/PT pulses palpable bilaterally, CRT less than 3 seconds Hyperkeratotic lesion noted on the medial aspect of the right second IPJ.  There is also hyperkeratotic lesion submetatarsal 5 and there is no underlying ulceration, drainage or any signs of infection either these areas.  There is prominent metatarsal head plantarly to the fat pad as well as digital contractures noted.  Not able to appreciate any area pinpoint tenderness. No pain with calf compression, swelling, warmth, erythema  Assessment: Preulcerative callus right hallux; digital formerly  Plan: -All treatment options discussed with the patient including all alternatives, risks, complications.  -Sharply debrided the hyperkeratotic lesions x 2 without any complications or bleeding today as a courtesy as they are quite minimal.  Continue offloading.  Will this will continue with various toe spacers to help offload decrease pressure.  Discussed shoes to avoid excess pressure on the bladder toebox as well.  Return if symptoms worsen or fail to improve.  Tonya Turner DPM

## 2023-07-09 DIAGNOSIS — L820 Inflamed seborrheic keratosis: Secondary | ICD-10-CM | POA: Diagnosis not present

## 2023-07-11 DIAGNOSIS — M353 Polymyalgia rheumatica: Secondary | ICD-10-CM | POA: Diagnosis not present

## 2023-07-25 DIAGNOSIS — M353 Polymyalgia rheumatica: Secondary | ICD-10-CM | POA: Diagnosis not present

## 2023-08-15 DIAGNOSIS — M25511 Pain in right shoulder: Secondary | ICD-10-CM | POA: Diagnosis not present

## 2023-08-15 DIAGNOSIS — N83209 Unspecified ovarian cyst, unspecified side: Secondary | ICD-10-CM | POA: Diagnosis not present

## 2023-08-16 DIAGNOSIS — M25511 Pain in right shoulder: Secondary | ICD-10-CM | POA: Diagnosis not present

## 2023-09-03 DIAGNOSIS — M79605 Pain in left leg: Secondary | ICD-10-CM | POA: Diagnosis not present

## 2023-09-03 DIAGNOSIS — R5383 Other fatigue: Secondary | ICD-10-CM | POA: Diagnosis not present

## 2023-09-03 DIAGNOSIS — M2560 Stiffness of unspecified joint, not elsewhere classified: Secondary | ICD-10-CM | POA: Diagnosis not present

## 2023-09-03 DIAGNOSIS — M79604 Pain in right leg: Secondary | ICD-10-CM | POA: Diagnosis not present

## 2023-09-03 DIAGNOSIS — Z6822 Body mass index (BMI) 22.0-22.9, adult: Secondary | ICD-10-CM | POA: Diagnosis not present

## 2023-09-05 DIAGNOSIS — M25551 Pain in right hip: Secondary | ICD-10-CM | POA: Diagnosis not present

## 2023-09-18 DIAGNOSIS — M19011 Primary osteoarthritis, right shoulder: Secondary | ICD-10-CM | POA: Diagnosis not present

## 2023-10-03 DIAGNOSIS — R531 Weakness: Secondary | ICD-10-CM | POA: Diagnosis not present

## 2023-10-03 DIAGNOSIS — M25611 Stiffness of right shoulder, not elsewhere classified: Secondary | ICD-10-CM | POA: Diagnosis not present

## 2023-10-28 ENCOUNTER — Ambulatory Visit: Admitting: Podiatry

## 2023-10-28 ENCOUNTER — Encounter: Payer: Self-pay | Admitting: Podiatry

## 2023-10-28 DIAGNOSIS — M2041 Other hammer toe(s) (acquired), right foot: Secondary | ICD-10-CM

## 2023-10-28 DIAGNOSIS — L989 Disorder of the skin and subcutaneous tissue, unspecified: Secondary | ICD-10-CM | POA: Diagnosis not present

## 2023-10-28 DIAGNOSIS — M216X1 Other acquired deformities of right foot: Secondary | ICD-10-CM | POA: Diagnosis not present

## 2023-10-30 NOTE — Progress Notes (Signed)
 Subjective: Chief Complaint  Patient presents with   Callouses    RM#14 right foot corn    83 year old female presents the above concerns.  She said that she has developed a corn on the medial aspect of the right second toe that she points to decide to have checked.  She is not sure if it is still there but she does have a check that she is going to go on a road trip she does not want her foot to be acting out.  She does not recall any recent injuries or changes.  No other concerns.    Objective: AAO x3, NAD DP/PT pulses palpable bilaterally, CRT less than 3 seconds Today there is no significant hyperkeratotic lesion noted on the medial aspect of the right second IPJ.  There is hyperkeratotic lesion submetatarsal 5 and there is no underlying ulceration, drainage or any signs of infection either these areas.  There is prominent metatarsal head plantarly to the fat pad as well as digital contractures noted.  Not able to appreciate any area pinpoint tenderness. No pain with calf compression, swelling, warmth, erythema  Assessment: Preulcerative callus right hallux; digital formerly  Plan: -All treatment options discussed with the patient including all alternatives, risks, complications.  -Sharply debrided the hyperkeratotic lesions  without any complications or bleeding today as a courtesy as they are quite minimal.  Continue offloading.   - Continue toe spacers, offloading  Return if symptoms worsen or fail to improve.  Charity Conch DPM

## 2023-12-12 DIAGNOSIS — L820 Inflamed seborrheic keratosis: Secondary | ICD-10-CM | POA: Diagnosis not present

## 2023-12-12 DIAGNOSIS — L821 Other seborrheic keratosis: Secondary | ICD-10-CM | POA: Diagnosis not present

## 2023-12-12 DIAGNOSIS — L720 Epidermal cyst: Secondary | ICD-10-CM | POA: Diagnosis not present

## 2023-12-12 DIAGNOSIS — I8391 Asymptomatic varicose veins of right lower extremity: Secondary | ICD-10-CM | POA: Diagnosis not present

## 2023-12-12 DIAGNOSIS — L814 Other melanin hyperpigmentation: Secondary | ICD-10-CM | POA: Diagnosis not present

## 2023-12-12 DIAGNOSIS — I8392 Asymptomatic varicose veins of left lower extremity: Secondary | ICD-10-CM | POA: Diagnosis not present

## 2023-12-12 DIAGNOSIS — D1801 Hemangioma of skin and subcutaneous tissue: Secondary | ICD-10-CM | POA: Diagnosis not present

## 2023-12-12 DIAGNOSIS — I788 Other diseases of capillaries: Secondary | ICD-10-CM | POA: Diagnosis not present

## 2023-12-12 DIAGNOSIS — L738 Other specified follicular disorders: Secondary | ICD-10-CM | POA: Diagnosis not present

## 2023-12-12 DIAGNOSIS — D2239 Melanocytic nevi of other parts of face: Secondary | ICD-10-CM | POA: Diagnosis not present

## 2024-01-09 DIAGNOSIS — Z8679 Personal history of other diseases of the circulatory system: Secondary | ICD-10-CM | POA: Diagnosis not present

## 2024-01-09 DIAGNOSIS — N952 Postmenopausal atrophic vaginitis: Secondary | ICD-10-CM | POA: Diagnosis not present

## 2024-01-09 DIAGNOSIS — Z0000 Encounter for general adult medical examination without abnormal findings: Secondary | ICD-10-CM | POA: Diagnosis not present

## 2024-01-09 DIAGNOSIS — E785 Hyperlipidemia, unspecified: Secondary | ICD-10-CM | POA: Diagnosis not present

## 2024-01-09 DIAGNOSIS — Z23 Encounter for immunization: Secondary | ICD-10-CM | POA: Diagnosis not present

## 2024-01-09 DIAGNOSIS — I4719 Other supraventricular tachycardia: Secondary | ICD-10-CM | POA: Diagnosis not present

## 2024-01-09 DIAGNOSIS — Z79899 Other long term (current) drug therapy: Secondary | ICD-10-CM | POA: Diagnosis not present

## 2024-01-09 DIAGNOSIS — R4189 Other symptoms and signs involving cognitive functions and awareness: Secondary | ICD-10-CM | POA: Diagnosis not present

## 2024-01-09 DIAGNOSIS — R7303 Prediabetes: Secondary | ICD-10-CM | POA: Diagnosis not present

## 2024-01-09 DIAGNOSIS — M85852 Other specified disorders of bone density and structure, left thigh: Secondary | ICD-10-CM | POA: Diagnosis not present

## 2024-01-09 DIAGNOSIS — Z1331 Encounter for screening for depression: Secondary | ICD-10-CM | POA: Diagnosis not present

## 2024-01-09 DIAGNOSIS — R350 Frequency of micturition: Secondary | ICD-10-CM | POA: Diagnosis not present

## 2024-01-13 ENCOUNTER — Other Ambulatory Visit: Payer: Self-pay | Admitting: Internal Medicine

## 2024-01-13 DIAGNOSIS — Z1231 Encounter for screening mammogram for malignant neoplasm of breast: Secondary | ICD-10-CM

## 2024-02-18 DIAGNOSIS — Z9189 Other specified personal risk factors, not elsewhere classified: Secondary | ICD-10-CM | POA: Diagnosis not present

## 2024-02-18 DIAGNOSIS — N83209 Unspecified ovarian cyst, unspecified side: Secondary | ICD-10-CM | POA: Diagnosis not present

## 2024-02-18 DIAGNOSIS — Z8619 Personal history of other infectious and parasitic diseases: Secondary | ICD-10-CM | POA: Diagnosis not present

## 2024-02-19 DIAGNOSIS — Z961 Presence of intraocular lens: Secondary | ICD-10-CM | POA: Diagnosis not present

## 2024-02-19 DIAGNOSIS — H5203 Hypermetropia, bilateral: Secondary | ICD-10-CM | POA: Diagnosis not present

## 2024-02-20 ENCOUNTER — Ambulatory Visit
Admission: RE | Admit: 2024-02-20 | Discharge: 2024-02-20 | Disposition: A | Discharge status: Home / Self Care | Source: Ambulatory Visit | Attending: Internal Medicine | Admitting: Internal Medicine

## 2024-02-20 DIAGNOSIS — Z1231 Encounter for screening mammogram for malignant neoplasm of breast: Secondary | ICD-10-CM | POA: Diagnosis not present

## 2024-04-06 DIAGNOSIS — L570 Actinic keratosis: Secondary | ICD-10-CM | POA: Diagnosis not present

## 2024-04-06 DIAGNOSIS — L820 Inflamed seborrheic keratosis: Secondary | ICD-10-CM | POA: Diagnosis not present

## 2024-04-06 DIAGNOSIS — L738 Other specified follicular disorders: Secondary | ICD-10-CM | POA: Diagnosis not present

## 2024-04-20 ENCOUNTER — Encounter: Payer: Self-pay | Admitting: Podiatry

## 2024-04-20 ENCOUNTER — Ambulatory Visit: Admitting: Podiatry

## 2024-04-20 DIAGNOSIS — M2042 Other hammer toe(s) (acquired), left foot: Secondary | ICD-10-CM

## 2024-04-20 DIAGNOSIS — M2041 Other hammer toe(s) (acquired), right foot: Secondary | ICD-10-CM | POA: Diagnosis not present

## 2024-04-20 DIAGNOSIS — L84 Corns and callosities: Secondary | ICD-10-CM

## 2024-04-20 DIAGNOSIS — M25552 Pain in left hip: Secondary | ICD-10-CM | POA: Diagnosis not present

## 2024-04-22 NOTE — Progress Notes (Signed)
 Subjective: Chief Complaint  Patient presents with   Callouses    Corn on left foot lateral side of 3rd toe. Non diabetic. 0 pain.      83 year old female presents the above concerns.  She presents today for new pain to the left fourth toe.  She had the toes always been curled but she started to get discomfort for the first time.  She does not report any injuries.  No change in shoes or activities.  No open lesions or any drainage.  No other concerns.  Objective: AAO x3, NAD DP/PT pulses palpable bilaterally, CRT less than 3 seconds There is a new hyperkeratotic lesion of the left fourth toe without any underlying ulceration, drainage or signs of infection.  Adductovarus present.  There are no other areas of pinpoint tenderness identified at this time.  No edema, erythema. No pain with calf compression, swelling, warmth, erythema  Assessment: Preulcerative callus right hallux; digital formerly  Plan: -All treatment options discussed with the patient including all alternatives, risks, complications.  -As courtesy I debrided the callus and complications of bleeding.  I dispensed various offloading pad.  I will look at ordering her a larger one as the one that we had was somewhat too small for her.  - Discussed she is to avoid excess pressure.  She is on a lot of work on changing shoes and various padding.  Return if symptoms worsen or fail to improve.  Donnice JONELLE Fees DPM

## 2024-04-30 ENCOUNTER — Telehealth: Payer: Self-pay | Admitting: Lab

## 2024-04-30 NOTE — Telephone Encounter (Signed)
 Patient calling stating you provided her with a silicone product that is working for her in need of name and where she can order from please advise.

## 2024-05-05 ENCOUNTER — Encounter: Payer: Self-pay | Admitting: Lab

## 2024-05-07 ENCOUNTER — Ambulatory Visit: Admitting: Podiatry

## 2024-05-08 ENCOUNTER — Encounter (HOSPITAL_BASED_OUTPATIENT_CLINIC_OR_DEPARTMENT_OTHER): Payer: Self-pay | Admitting: Cardiology

## 2024-05-08 ENCOUNTER — Ambulatory Visit (HOSPITAL_BASED_OUTPATIENT_CLINIC_OR_DEPARTMENT_OTHER): Admitting: Cardiology

## 2024-05-08 VITALS — BP 98/64 | HR 82 | Ht 64.5 in | Wt 138.0 lb

## 2024-05-08 DIAGNOSIS — I480 Paroxysmal atrial fibrillation: Secondary | ICD-10-CM

## 2024-05-08 DIAGNOSIS — E782 Mixed hyperlipidemia: Secondary | ICD-10-CM | POA: Diagnosis not present

## 2024-05-08 DIAGNOSIS — I471 Supraventricular tachycardia, unspecified: Secondary | ICD-10-CM

## 2024-05-08 DIAGNOSIS — Z79899 Other long term (current) drug therapy: Secondary | ICD-10-CM

## 2024-05-08 MED ORDER — FLECAINIDE ACETATE 50 MG PO TABS: 50.0000 mg | ORAL_TABLET | Freq: Two times a day (BID) | ORAL | 3 refills | Status: AC

## 2024-05-08 MED ORDER — DILTIAZEM HCL 30 MG PO TABS: 30.0000 mg | ORAL_TABLET | Freq: Four times a day (QID) | ORAL | 3 refills | Status: AC | PRN

## 2024-05-08 NOTE — Progress Notes (Unsigned)
°  Cardiology Office Note:  .   Date:  05/08/2024  ID:  Tonya Turner, DOB 10-02-1940, MRN [id number] PCP: Dwight Trula SQUIBB, MD  [facility] Providers Cardiologist:  Oneil Parchment, MD Electrophysiologist:  Lynwood Rakers, MD (Inactive) { Click to update primary MD,subspecialty MD or APP then REFRESH:1}   History of Present Illness: .   Tonya Turner is a 83 y.o. female Discussed the use of AI scribe software for clinical note transcription with the patient, who gave verbal consent to proceed.  History of Present Illness       ROS: ***  Studies Reviewed: SABRA   EKG Interpretation Date/Time:  Friday May 08 2024 15:28:28 EST Ventricular Rate:  82 PR Interval:  160 QRS Duration:  72 QT Interval:  382 QTC Calculation: 446 R Axis:   80  Text Interpretation: Normal sinus rhythm Normal ECG When compared with ECG of 12-Mar-2023 14:33, No significant change was found Confirmed by Parchment Oneil ([id number]) on 05/08/2024 3:57:09 PM    Results  Risk Assessment/Calculations:   {Does this patient have ATRIAL FIBRILLATION?:[phone]}         Physical Exam:   VS:  BP 98/64 (BP Location: Right Arm, Patient Position: Sitting, Cuff Size: Normal)   Pulse 82   Ht 5' 4.5 (1.638 m)   Wt 138 lb (62.6 kg)   LMP  (LMP Unknown)   SpO2 98%   BMI 23.32 kg/m    Wt Readings from Last 3 Encounters:  05/08/24 138 lb (62.6 kg)  03/12/23 134 lb (60.8 kg)  03/02/22 134 lb (60.8 kg)    GEN: Well nourished, well developed in no acute distress NECK: No JVD; No carotid bruits CARDIAC: ***RRR, no murmurs, no rubs, no gallops RESPIRATORY:  Clear to auscultation without rales, wheezing or rhonchi  ABDOMEN: Soft, non-tender, non-distended EXTREMITIES:  No edema; No deformity   ASSESSMENT AND PLAN: .    Assessment and Plan Assessment & Plan        {Are you ordering a CV Procedure (e.g. stress test, cath, DCCV, TEE, etc)?   Press F2        :[id number]}  Dispo: ***  Signed, Oneil Parchment, MD

## 2024-05-08 NOTE — Patient Instructions (Signed)
 Medication Instructions:   Your physician recommends that you continue on your current medications as directed. Please refer to the Current Medication list given to you today.   *If you need a refill on your cardiac medications before your next appointment, please call your pharmacy*  Lab Work:  None ordered.  If you have labs (blood work) drawn today and your tests are completely normal, you will receive your results only by: MyChart Message (if you have MyChart) OR A paper copy in the mail If you have any lab test that is abnormal or we need to change your treatment, we will call you to review the results.  Testing/Procedures:  None ordered.  Follow-Up: At [facility], you and your health needs are our priority.  As part of our continuing mission to provide you with exceptional heart care, our providers are all part of one team.  This team includes your primary Cardiologist (physician) and Advanced Practice Providers or APPs (Physician Assistants and Nurse Practitioners) who all work together to provide you with the care you need, when you need it.  Your next appointment:   1 year(s)  Provider:   Dorothye Gathers, MD    We recommend signing up for the patient portal called "MyChart".  Sign up information is provided on this After Visit Summary.  MyChart is used to connect with patients for Virtual Visits (Telemedicine).  Patients are able to view lab/test results, encounter notes, upcoming appointments, etc.  Non-urgent messages can be sent to your provider as well.   To learn more about what you can do with MyChart, go to [email or web address].   Other Instructions  Your physician wants you to follow-up in: 1 year.  You will receive a reminder letter in the mail two months in advance. If you don't receive a letter, please call our office to schedule the follow-up appointment.

## 2024-05-12 NOTE — Telephone Encounter (Signed)
 Will you let her know that they have arrived. I will hold them at your desk if she wants to come by to look at what I got for her. They are different as I could not find the exact ones.

## 2024-05-22 ENCOUNTER — Other Ambulatory Visit: Payer: Self-pay | Admitting: Nurse Practitioner

## 2024-05-22 DIAGNOSIS — N83209 Unspecified ovarian cyst, unspecified side: Secondary | ICD-10-CM

## 2024-05-29 ENCOUNTER — Telehealth: Payer: Self-pay | Admitting: Podiatry

## 2024-05-29 NOTE — Telephone Encounter (Signed)
 Patient wants top let you know that the Large size silicone foot aid worked best

## 2024-06-15 ENCOUNTER — Ambulatory Visit: Admitting: Podiatry

## 2024-06-15 DIAGNOSIS — M216X1 Other acquired deformities of right foot: Secondary | ICD-10-CM | POA: Diagnosis not present

## 2024-06-15 DIAGNOSIS — M2041 Other hammer toe(s) (acquired), right foot: Secondary | ICD-10-CM

## 2024-06-15 DIAGNOSIS — L84 Corns and callosities: Secondary | ICD-10-CM | POA: Diagnosis not present

## 2024-06-15 DIAGNOSIS — M2042 Other hammer toe(s) (acquired), left foot: Secondary | ICD-10-CM

## 2024-06-15 NOTE — Progress Notes (Unsigned)
 Subjective: Chief Complaint  Patient presents with   Callouses    Patient presents today for callous trimming on her Right foot .     84 year old female presents the above concerns.  She has concerns of calluses on her right foot.  Submetatarsal 5 as well as on the second toe on the right foot.  She thinks that the right one subfive started when she was wearing a pad to help offload and cause some irritation.  She is not sure for the second toe started.  She denies any swelling, redness or any drainage.  No open lesions that she reports otherwise.  No injuries.   Objective: AAO x3, NAD DP/PT pulses palpable bilaterally, CRT less than 3 seconds Small punctate and a hyperkeratotic lesion on the right second toe without any underlying ulceration, drainage or signs of infection.  There is a thicker hyperkeratotic lesion submetatarsal 5 on the right foot.  There is no erythema or warmth or any signs of infection.  No underlying ulceration.  Digital contractures and prominent metatarsal heads are present which are unchanged. No pain with calf compression, swelling, warmth, erythema  Assessment: Preulcerative callus right foot  Plan: -All treatment options discussed with the patient including all alternatives, risks, complications.  -Debrided the area and complications or bleeding.  Discussed Voltaren gel particular to the area submetatarsal 5.  Continue offloading.  Monitor for any signs or symptoms of infection and/or skin breakdown.  Return if symptoms worsen or fail to improve.  Donnice JONELLE Fees DPM

## 2024-06-18 ENCOUNTER — Telehealth: Payer: Self-pay | Admitting: Lab

## 2024-06-18 ENCOUNTER — Encounter: Payer: Self-pay | Admitting: Lab

## 2024-06-18 ENCOUNTER — Other Ambulatory Visit: Payer: Self-pay | Admitting: Podiatry

## 2024-06-18 DIAGNOSIS — I872 Venous insufficiency (chronic) (peripheral): Secondary | ICD-10-CM

## 2024-06-18 NOTE — Telephone Encounter (Signed)
 Referral sent office note to follow.

## 2024-06-18 NOTE — Telephone Encounter (Signed)
 Patient calling would like referral for [facility] you have discussed with her previously.

## 2024-06-29 ENCOUNTER — Ambulatory Visit: Admitting: Podiatry

## 2024-07-20 ENCOUNTER — Ambulatory Visit: Admitting: Podiatry
